# Patient Record
Sex: Female | Born: 1955 | Race: Black or African American | Hispanic: No | Marital: Single | Smoking: Never smoker
Health system: Southern US, Community
[De-identification: ages and names within clinical notes are randomized; demographics above are authoritative.]

## PROBLEM LIST (undated history)

## (undated) DIAGNOSIS — I890 Lymphedema, not elsewhere classified: Secondary | ICD-10-CM

## (undated) DIAGNOSIS — I10 Essential (primary) hypertension: Secondary | ICD-10-CM

## (undated) DIAGNOSIS — D649 Anemia, unspecified: Secondary | ICD-10-CM

## (undated) DIAGNOSIS — R29898 Other symptoms and signs involving the musculoskeletal system: Secondary | ICD-10-CM

## (undated) DIAGNOSIS — E119 Type 2 diabetes mellitus without complications: Secondary | ICD-10-CM

## (undated) DIAGNOSIS — G43909 Migraine, unspecified, not intractable, without status migrainosus: Secondary | ICD-10-CM

## (undated) HISTORY — DX: Anemia, unspecified: D64.9

## (undated) HISTORY — PX: OTHER SURGICAL HISTORY: SHX169

## (undated) HISTORY — DX: Migraine, unspecified, not intractable, without status migrainosus: G43.909

---

## 2007-12-10 ENCOUNTER — Emergency Department: Payer: Self-pay | Admitting: Emergency Medicine

## 2008-03-19 ENCOUNTER — Inpatient Hospital Stay: Payer: Self-pay | Admitting: Internal Medicine

## 2008-08-03 ENCOUNTER — Emergency Department: Payer: Self-pay | Admitting: Emergency Medicine

## 2008-11-09 DIAGNOSIS — M79609 Pain in unspecified limb: Secondary | ICD-10-CM | POA: Insufficient documentation

## 2009-08-22 ENCOUNTER — Emergency Department: Payer: Self-pay | Admitting: Emergency Medicine

## 2009-08-27 DIAGNOSIS — I890 Lymphedema, not elsewhere classified: Secondary | ICD-10-CM | POA: Insufficient documentation

## 2010-10-21 DIAGNOSIS — E1122 Type 2 diabetes mellitus with diabetic chronic kidney disease: Secondary | ICD-10-CM | POA: Insufficient documentation

## 2011-10-27 DIAGNOSIS — E1139 Type 2 diabetes mellitus with other diabetic ophthalmic complication: Secondary | ICD-10-CM | POA: Insufficient documentation

## 2011-10-27 DIAGNOSIS — H3581 Retinal edema: Secondary | ICD-10-CM | POA: Insufficient documentation

## 2012-06-02 DIAGNOSIS — H4060X0 Glaucoma secondary to drugs, unspecified eye, stage unspecified: Secondary | ICD-10-CM | POA: Insufficient documentation

## 2013-01-28 ENCOUNTER — Emergency Department: Payer: Self-pay | Admitting: Emergency Medicine

## 2013-01-28 LAB — SEDIMENTATION RATE: Erythrocyte Sed Rate: >140 mm/hr — ABNORMAL HIGH (ref 0–30)

## 2013-01-28 LAB — BASIC METABOLIC PANEL
Anion Gap: 9 (ref 7–16)
Calcium, Total: 9.5 mg/dL (ref 8.5–10.1)
Chloride: 102 mmol/L (ref 98–107)
Co2: 25 mmol/L (ref 21–32)
Creatinine: 1.27 mg/dL (ref 0.60–1.30)
EGFR (African American): 55 — ABNORMAL LOW
EGFR (Non-African Amer.): 47 — ABNORMAL LOW
Glucose: 202 mg/dL — ABNORMAL HIGH (ref 65–99)
Potassium: 4.2 mmol/L (ref 3.5–5.1)
Sodium: 136 mmol/L (ref 136–145)

## 2013-01-28 LAB — CBC
HCT: 24.7 % — ABNORMAL LOW (ref 35.0–47.0)
HGB: 7.5 g/dL — ABNORMAL LOW (ref 12.0–16.0)
MCH: 23.4 pg — ABNORMAL LOW (ref 26.0–34.0)
RBC: 3.2 10*6/uL — ABNORMAL LOW (ref 3.80–5.20)
WBC: 10.2 10*3/uL (ref 3.6–11.0)

## 2013-03-05 ENCOUNTER — Emergency Department: Payer: Self-pay | Admitting: Emergency Medicine

## 2013-03-05 LAB — BASIC METABOLIC PANEL
ANION GAP: 4 — AB (ref 7–16)
BUN: 18 mg/dL (ref 7–18)
CREATININE: 1.6 mg/dL — AB (ref 0.60–1.30)
Calcium, Total: 9.5 mg/dL (ref 8.5–10.1)
Chloride: 103 mmol/L (ref 98–107)
Co2: 29 mmol/L (ref 21–32)
EGFR (Non-African Amer.): 36 — ABNORMAL LOW
GFR CALC AF AMER: 42 — AB
GLUCOSE: 319 mg/dL — AB (ref 65–99)
Osmolality: 286 (ref 275–301)
POTASSIUM: 3.8 mmol/L (ref 3.5–5.1)
Sodium: 136 mmol/L (ref 136–145)

## 2013-03-05 LAB — CBC WITH DIFFERENTIAL/PLATELET
Basophil #: 0.1 10*3/uL (ref 0.0–0.1)
Basophil %: 1 %
Eosinophil #: 0.2 10*3/uL (ref 0.0–0.7)
Eosinophil %: 1.7 %
HCT: 23.9 % — ABNORMAL LOW (ref 35.0–47.0)
HGB: 7.4 g/dL — AB (ref 12.0–16.0)
LYMPHS PCT: 7.8 %
Lymphocyte #: 0.8 10*3/uL — ABNORMAL LOW (ref 1.0–3.6)
MCH: 23 pg — AB (ref 26.0–34.0)
MCHC: 31 g/dL — AB (ref 32.0–36.0)
MCV: 74 fL — AB (ref 80–100)
Monocyte #: 0.7 x10 3/mm (ref 0.2–0.9)
Monocyte %: 6.3 %
Neutrophil #: 8.8 10*3/uL — ABNORMAL HIGH (ref 1.4–6.5)
Neutrophil %: 83.2 %
Platelet: 330 10*3/uL (ref 150–440)
RBC: 3.22 10*6/uL — ABNORMAL LOW (ref 3.80–5.20)
RDW: 17.4 % — AB (ref 11.5–14.5)
WBC: 10.5 10*3/uL (ref 3.6–11.0)

## 2013-03-05 LAB — SEDIMENTATION RATE: Erythrocyte Sed Rate: >140 mm/hr — ABNORMAL HIGH (ref 0–30)

## 2013-03-05 LAB — URINALYSIS, COMPLETE
BILIRUBIN, UR: NEGATIVE
Bacteria: NONE SEEN
Glucose,UR: 50 mg/dL (ref 0–75)
KETONE: NEGATIVE
Leukocyte Esterase: NEGATIVE
Nitrite: NEGATIVE
PH: 6 (ref 4.5–8.0)
Protein: 100
RBC,UR: 14 /HPF (ref 0–5)
SPECIFIC GRAVITY: 1.01 (ref 1.003–1.030)
Squamous Epithelial: 7
WBC UR: <1 /HPF (ref 0–5)

## 2013-09-29 ENCOUNTER — Ambulatory Visit: Payer: Self-pay | Admitting: Otolaryngology

## 2015-05-04 ENCOUNTER — Emergency Department
Admission: EM | Admit: 2015-05-04 | Discharge: 2015-05-04 | Disposition: A | Discharge status: Home / Self Care | Payer: BLUE CROSS/BLUE SHIELD | Attending: Emergency Medicine | Admitting: Emergency Medicine

## 2015-05-04 ENCOUNTER — Emergency Department: Payer: BLUE CROSS/BLUE SHIELD

## 2015-05-04 DIAGNOSIS — G4489 Other headache syndrome: Secondary | ICD-10-CM | POA: Diagnosis not present

## 2015-05-04 DIAGNOSIS — R51 Headache: Secondary | ICD-10-CM | POA: Diagnosis present

## 2015-05-04 LAB — SEDIMENTATION RATE: SED RATE: 126 mm/h — AB (ref 0–30)

## 2015-05-04 MED ORDER — HYDROCODONE-ACETAMINOPHEN 5-325 MG PO TABS: 1.0000 | ORAL_TABLET | Freq: Four times a day (QID) | ORAL | Status: DC | PRN

## 2015-05-04 MED ORDER — HYDROCODONE-ACETAMINOPHEN 5-325 MG PO TABS
1.0000 | ORAL_TABLET | Freq: Once | ORAL | Status: AC
Start: 2015-05-04 — End: 2015-05-04
  Administered 2015-05-04: 1 via ORAL
  Filled 2015-05-04: qty 1

## 2015-05-04 NOTE — ED Provider Notes (Signed)
Time Seen: Approximately 11:15  I have reviewed the triage notes  Chief Complaint: Headache   History of Present Illness: Veronica Krueger is a 60 y.o. female who states she's noticed a headache now left-sided over the past week. She states the headache is been on and off but relatively constant this morning. He denies any history of similar headaches. She states that she'sa mild nausea with no persistent vomiting. She states she has vision problems and is followed frequently by an ophthalmologist, but denies any new visual disturbances. She denies any head trauma, fever, weakness. She points mainly to the left occipital area and states occasionally the headache will radiate toward the left temporal region. She's not noticed any rashes. She denies any photophobia, midline neck pain, etc.   No past medical history on file.  There are no active problems to display for this patient.   No past surgical history on file.  No past surgical history on file.  No current outpatient prescriptions on file.  Allergies:  Gabapentin and Penicillins  Family History: No family history on file.  Social History: Social History  Substance Use Topics  . Smoking status: Not on file  . Smokeless tobacco: Not on file  . Alcohol Use: Not on file     Review of Systems:   10 point review of systems was performed and was otherwise negative:  Constitutional: No fever Eyes: No visual disturbances ENT: No sore throat, ear pain Cardiac: No chest pain Respiratory: No shortness of breath, wheezing, or stridor Abdomen: No abdominal pain, no vomiting, No diarrhea Endocrine: No weight loss, No night sweats Extremities:Chronic bilateral peripheral edema Skin: No rashes, easy bruising Neurologic: No focal weakness, trouble with speech or swollowing Urologic: No dysuria, Hematuria, or urinary frequency   Physical Exam:  ED Triage Vitals  Enc Vitals Group     BP --      Pulse Rate 05/04/15 1043  70     Resp 05/04/15 1043 18     Temp 05/04/15 1043 97.7 F (36.5 C)     Temp Source 05/04/15 1043 Oral     SpO2 05/04/15 1043 100 %     Weight 05/04/15 1043 436 lb 15.2 oz (198.2 kg)     Height 05/04/15 1043 5\' 9"  (1.753 m)     Head Cir --      Peak Flow --      Pain Score 05/04/15 1045 8     Pain Loc --      Pain Edu? --      Excl. in [place]? --     General: Awake , Alert , and Oriented times 3; GCS 15 Head: Normal cephalic , atraumatic Eyes: Pupils equal , round, reactive to light Nose/Throat: No nasal drainage, patent upper airway without erythema or exudate.  Neck: Supple, Full range of motion, No anterior adenopathy or palpable thyroid masses Lungs: Clear to ascultation without wheezes , rhonchi, or rales Heart: Regular rate, regular rhythm without murmurs , gallops , or rubs Abdomen: Soft, non tender without rebound, guarding , or rigidity; bowel sounds positive and symmetric in all 4 quadrants. No organomegaly .        Extremities: 2 plus symmetric pulses. No edema, clubbing or cyanosis Neurologic: normal ambulation, Motor symmetric without deficits, sensory intact Skin: warm, dry, no rashes   Labs:   All laboratory work was reviewed including any pertinent negatives or positives listed below:  Labs Reviewed  SEDIMENTATION RATE   Radiology:    EXAM:  CT HEAD WITHOUT CONTRAST  TECHNIQUE: Contiguous axial images were obtained from the base of the skull through the vertex without intravenous contrast.  COMPARISON: None.  FINDINGS: Unremarkable appearance of the calvarium without acute fracture or aggressive lesion.  Unremarkable appearance of the scalp soft tissues.  Unremarkable appearance of the bilateral orbits.  Mastoid air cells are clear.  No significant paranasal sinus disease  No acute intracranial hemorrhage, midline shift, or mass effect.  Gray-white differentiation is maintained, without CT evidence of acute ischemia.  Unremarkable  configuration of the ventricles.  IMPRESSION: No CT evidence of acute intracranial abnormality.  Signed,  Dulcy Fanny. Earleen Newport DO  [facility]  [facility]    I personally reviewed the radiologic studies     ED Course: * Differential diagnosis includes but is not exclusive to subarachnoid hemorrhage, meningitis, encephalitis, previous head trauma, cavernous venous thrombosis, muscle tension headache, temporal arteritis, migraine or migraine equivalent, etc.  Patient's stay here was uneventful notice her sedimentation rate is elevated though review of her previous laboratory work shows past elevated sedimentation rates. It is hard to determine whether or not this may be temporal arteritis, however , the patient denies any new visual disturbances. Because of her diabetes I did not want to start her inappropriately on high-dose prednisone at this time. We are reassured with her normal CAT scan that she doesn't have an intracerebral mass which was a concern given her a one-week history of headache. I do not have a strong clinical suspicion for subarachnoid hemorrhage, cavernous venous thrombosis, etc. Patient was advised to contact her ophthalmologist who she sees on a regular basis and also her primary physician for further outpatient management. Given her description and it seems to be either a muscle tension headache the this time.  Assessment:  Acute unspecified cephalgia      Plan:  The patient's otherwise done well here in emergency department on oral Norco and she'll be discharged on the same. She was advised that she can take over-the-counter pain medication Outpatient management with headache instructions Patient was advised to return immediately if condition worsens. Patient was advised to follow up with their primary care physician or other specialized physicians involved in their outpatient care            Daymon Larsen, MD 05/04/15 1425

## 2015-05-04 NOTE — Discharge Instructions (Signed)
General Headache Without Cause A headache is pain or discomfort felt around the head or neck area. The specific cause of a headache may not be found. There are many causes and types of headaches. A few common ones are:  Tension headaches.  Migraine headaches.  Cluster headaches.  Chronic daily headaches. HOME CARE INSTRUCTIONS  Watch your condition for any changes. Take these steps to help with your condition: Managing Pain  Take over-the-counter and prescription medicines only as told by your health care provider.  Lie down in a dark, quiet room when you have a headache.  If directed, apply ice to the head and neck area:  Put ice in a plastic bag.  Place a towel between your skin and the bag.  Leave the ice on for 20 minutes, 2-3 times per day.  Use a heating pad or hot shower to apply heat to the head and neck area as told by your health care provider.  Keep lights dim if bright lights bother you or make your headaches worse. Eating and Drinking  Eat meals on a regular schedule.  Limit alcohol use.  Decrease the amount of caffeine you drink, or stop drinking caffeine. General Instructions  Keep all follow-up visits as told by your health care provider. This is important.  Keep a headache journal to help find out what may trigger your headaches. For example, write down:  What you eat and drink.  How much sleep you get.  Any change to your diet or medicines.  Try massage or other relaxation techniques.  Limit stress.  Sit up straight, and do not tense your muscles.  Do not use tobacco products, including cigarettes, chewing tobacco, or e-cigarettes. If you need help quitting, ask your health care provider.  Exercise regularly as told by your health care provider.  Sleep on a regular schedule. Get 7-9 hours of sleep, or the amount recommended by your health care provider. SEEK MEDICAL CARE IF:   Your symptoms are not helped by medicine.  You have a  headache that is different from the usual headache.  You have nausea or you vomit.  You have a fever. SEEK IMMEDIATE MEDICAL CARE IF:   Your headache becomes severe.  You have repeated vomiting.  You have a stiff neck.  You have a loss of vision.  You have problems with speech.  You have pain in the eye or ear.  You have muscular weakness or loss of muscle control.  You lose your balance or have trouble walking.  You feel faint or pass out.  You have confusion.   This information is not intended to replace advice given to you by your health care provider. Make sure you discuss any questions you have with your health care provider.   Document Released: 02/09/2005 Document Revised: 10/31/2014 Document Reviewed: 06/04/2014 Elsevier Interactive Patient Education [employer].  Please return immediately if condition worsens. Please contact her primary physician or the physician you were given for referral. If you have any specialist physicians involved in her treatment and plan please also contact them. Thank you for using [place] [facility] emergency [facility].

## 2015-05-04 NOTE — ED Notes (Addendum)
Pt reports left side headache off and on for past week. Denies history migraines. Denies any other complaints except nausea

## 2015-10-30 DIAGNOSIS — E119 Type 2 diabetes mellitus without complications: Secondary | ICD-10-CM

## 2015-10-30 DIAGNOSIS — H2512 Age-related nuclear cataract, left eye: Secondary | ICD-10-CM | POA: Insufficient documentation

## 2015-10-30 HISTORY — DX: Type 2 diabetes mellitus without complications: E11.9

## 2016-10-28 DIAGNOSIS — Z961 Presence of intraocular lens: Secondary | ICD-10-CM | POA: Diagnosis not present

## 2016-10-28 DIAGNOSIS — H35372 Puckering of macula, left eye: Secondary | ICD-10-CM | POA: Diagnosis not present

## 2016-10-28 DIAGNOSIS — E113211 Type 2 diabetes mellitus with mild nonproliferative diabetic retinopathy with macular edema, right eye: Secondary | ICD-10-CM | POA: Diagnosis not present

## 2016-10-28 DIAGNOSIS — H47399 Other disorders of optic disc, unspecified eye: Secondary | ICD-10-CM | POA: Diagnosis not present

## 2016-12-23 DIAGNOSIS — E113313 Type 2 diabetes mellitus with moderate nonproliferative diabetic retinopathy with macular edema, bilateral: Secondary | ICD-10-CM | POA: Diagnosis not present

## 2016-12-23 DIAGNOSIS — Z961 Presence of intraocular lens: Secondary | ICD-10-CM | POA: Diagnosis not present

## 2016-12-23 DIAGNOSIS — H35372 Puckering of macula, left eye: Secondary | ICD-10-CM | POA: Diagnosis not present

## 2017-02-12 DIAGNOSIS — I10 Essential (primary) hypertension: Secondary | ICD-10-CM | POA: Diagnosis not present

## 2017-02-12 DIAGNOSIS — E119 Type 2 diabetes mellitus without complications: Secondary | ICD-10-CM | POA: Diagnosis not present

## 2017-02-12 DIAGNOSIS — M79606 Pain in leg, unspecified: Secondary | ICD-10-CM | POA: Diagnosis not present

## 2017-02-12 DIAGNOSIS — R69 Illness, unspecified: Secondary | ICD-10-CM | POA: Diagnosis not present

## 2017-02-12 DIAGNOSIS — E7849 Other hyperlipidemia: Secondary | ICD-10-CM | POA: Diagnosis not present

## 2017-02-12 DIAGNOSIS — R21 Rash and other nonspecific skin eruption: Secondary | ICD-10-CM | POA: Diagnosis not present

## 2017-02-12 DIAGNOSIS — Q820 Hereditary lymphedema: Secondary | ICD-10-CM | POA: Diagnosis not present

## 2017-03-11 DIAGNOSIS — I8312 Varicose veins of left lower extremity with inflammation: Secondary | ICD-10-CM | POA: Diagnosis not present

## 2017-03-11 DIAGNOSIS — I8311 Varicose veins of right lower extremity with inflammation: Secondary | ICD-10-CM | POA: Diagnosis not present

## 2017-03-11 DIAGNOSIS — I890 Lymphedema, not elsewhere classified: Secondary | ICD-10-CM | POA: Diagnosis not present

## 2017-03-23 DIAGNOSIS — E113213 Type 2 diabetes mellitus with mild nonproliferative diabetic retinopathy with macular edema, bilateral: Secondary | ICD-10-CM | POA: Diagnosis not present

## 2017-03-23 DIAGNOSIS — H35372 Puckering of macula, left eye: Secondary | ICD-10-CM | POA: Diagnosis not present

## 2017-03-23 DIAGNOSIS — H2512 Age-related nuclear cataract, left eye: Secondary | ICD-10-CM | POA: Diagnosis not present

## 2017-03-29 ENCOUNTER — Ambulatory Visit: Payer: Medicare HMO | Attending: Internal Medicine | Admitting: Occupational Therapy

## 2017-03-29 ENCOUNTER — Other Ambulatory Visit: Payer: Self-pay

## 2017-03-29 DIAGNOSIS — I890 Lymphedema, not elsewhere classified: Secondary | ICD-10-CM | POA: Insufficient documentation

## 2017-03-29 DIAGNOSIS — R269 Unspecified abnormalities of gait and mobility: Secondary | ICD-10-CM | POA: Insufficient documentation

## 2017-03-30 ENCOUNTER — Encounter: Payer: Self-pay | Admitting: Occupational Therapy

## 2017-03-30 NOTE — Therapy (Signed)
[place] MAIN [facility] SERVICES [address] [place], [place], [address] Phone: [phone]   Fax:  [phone]  Occupational Therapy Evaluation and Discharge Summary Lymphedema   Patient Details  Name: Veronica Krueger MRN: [id number] Date of Birth: 1956-02-13 Referring Provider: Sarina Ser, MD   Encounter Date: 03/29/2017  OT End of Session - 03/30/17 1442    Visit Number  1    Number of Visits  1    OT Start Time  0210    OT Stop Time  0310    OT Time Calculation (min)  60 min    Equipment Utilized During Treatment  Hi Lo table    Activity Tolerance  Patient tolerated treatment well;Other (comment) Pt limited by inability BLE weakness impacting inability to transfer from transport wc to treatment surface. No grab bars available in  outpatient clinic.    Behavior During Therapy  [facility] for tasks assessed/performed       History reviewed. No pertinent past medical history.  History reviewed. No pertinent surgical history.  There were no vitals filed for this visit.  Subjective Assessment - 03/30/17 1414    Subjective   Veronica Krueger is referred to Occupational Therapy for evaluation and treatment of BLE Lymphedema (LE) by Veronica Bathe, MD. Pt reports onset of BLE greater than 5 years ago, but she knowas of no specific precipitating event. She is unsure of a specific onset date, stating, "I jsut looked down and wondered how my legs got to be this way."  Pt reports legs swelling has become more frequent and more severe over the last 2 years.  Pt denies hx of venous ulcers , VDT and  non-healing wounds. She  does not wear day or night  time compression garments and she has not previously undergone Complete Decongestive Therapy (CDT) for LE care.    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; DM; Obesity; BLE muscle weakness, impaired functional  mobilitty;-unable to complete SPT from wc to hi lo table in clinic (unable to  lift feet on and off of transport wc front rigging,; difficulty walking- uses manual wc in the home and requires assistance from others for pushing manual wc in the community 2/2 B shoulder pain; unable to drive own vehickle; unable to reach feet to inspect skin, perform nail and skin care, and bath feet and distal legs;     Limitations  Non-ambulatory; impaired functional mobilitty;-unable to complete SPT from wc to hi lo table in clinic; unable to  lift feet on and off of transport wc front rigging,  reliant on manual wc in the home;  requires assistance from others to propell manual w/c  in the community 2/2 B shoulder pain; unable to drive own vehickle; unable to reach feet to don and doff  shoes, inspect skin, perform nail and skin care and Krueger feet and distal legs;; difficulty fitting LB clothing and preferred stree5t shoes; difficulty performing instrumental ADLs requiring standing and walking , including home management activities, cooking/ meal prep, cleaning, washing dishes, laundry, making beds and yardwork.     Currently in Pain?  Yes    Pain Descriptors / Indicators  Aching;Tightness;Sore;Tiring;Heaviness;Discomfort;Numbness    Pain Type  Chronic pain    Pain Onset  Today >5 yrs s/p    Pain Frequency  Intermittent    Aggravating Factors   standing, walking, dependent positioning    Pain Relieving  Factors  nothing    Effect of Pain on Daily Activities  see LIMITATIONS above. chronic leg swelling and pain limits functional performance in all occipational domains, including basic and instrumental ADLs, productive ac5ivities, leisure pursuits, and social participation and role performance       Mild-Moderate, Stage II, BLE lymphedema, L>R, secondary to unknown etiology   Skin Description Hyper-Keratosis [place] Orange Shiny Tight Fibrotic Fatty Doughy Indurated    x x x Moderately dense palpable fibrosis below  knees to ankles. L>R       Hydration Dry Flaky Erythema Other   mildly x     Color Redness Present Pallor Blanching Hemosiderin Staining Other      Slight skinb darkening at distal legs    Odor Malodorous Yeast Present Absent      x   Temperature Warm Cool Normal     x   Pitting Edema  NON PITTING   1+ 2+ 3+ 4+ >4            Girth Symmetrical Asymmetrical Other Distribution    L>R    Stemmer Sign Positive Negative   Strong + , L>R    Lymphorrea History Of:  Present Absent     Absent , at present, but + hx    Wounds History Of Present Absent Venous Arterial Pressure Size   denies   x          Signs of Infection Redness Warmth Erythema Acute Swelling Drainage absent                x   Scars Adhesions Hypersensitivity        Sensation Light Touch Deep pressure Hypersensitivty   Present Impaired Present Impaired Present Impaired   WNL   WNL- tender  WNL   x  Nails WNL Fungus Present Other   x     Hair Growth Symmetrical Asymmetrical   x    Skin Creases Base pf toes  Base of Fingers       mild            OT Education - 03/30/17 1437    Education provided  Yes    Education Details  Provided Pt/caregiver skilled education regarding lymphatic structure and function, various lymphedema etiologies, obnset patterns and progression,  impact of obesity on lymphatic system function, and  discussed Complete Decongestive Therapy for LE care, including 4 components of Intensive and Self Management Phases.  Discussed   Importance of daily daily LE self care to retain clinical gains and limit progression.  Discussed lymphedema precautions, cellulitis risk,  Provided printed Lymphedema Workbook for reference.    Person(s) Educated  Patient    Methods  Explanation;Demonstration;Handout    Comprehension  Verbalized understanding;Returned demonstration          OT Long Term Goals - 03/29/17 1448      OT LONG TERM GOAL #1   Title  Pt  modified independent w/  lymphedema precautions and prevention principals and strategies using printed Lymphedema Workbook as a reference to limit LE progression, increased  infection risk and further functional decline.     Baseline  Max A    Time  1    Period  Days    Status  Achieved    Target Date  03/29/17            Plan - 03/30/17 1611    Clinical Impression Statement  Veronica Krueger is a 62 year old female presenting with moderate,  stage III, BLE lymphedema secondary to morbid obesity and dependent positioning.  Sudies demonstrate poorer lymphedema treatment outcomes due to extensive stress on the lymphatic structures with excessive adipose tissue. Prior to commencing lymphedema treatment program this Pt will benefit from participation in an effictive weight reduction plan that will have a significant impact on reduction of limb volumes, both fluid and subcutaneous fat reduction.      Occupational performance deficits (Please refer to evaluation for details):  ADL's;IADL's;Work;Leisure;Social Participation;Other body image    Rehab Potential  Poor    Current Impairments/barriers affecting progress:  impaired functional transfers, BLE muscle weakness limits fluid muscle pump efficacy    OT Treatment/Interventions  Self-care/ADL training    Plan  Pt is evaluated and discharged from Occupational Therapy for Complete Decongestive Therapy to BLE to address BLE lymphedema. Pt iPty has a poor prognosis for treatment eficacy without significant weight loss.     OT Home Exercise Plan  Prior to undergoing lymphedema treatment , Pt will benefit from medical management for weight loss. Pt will benefit from a consult with Dominica Severin, MD, at [facility] , who specializes in examining new therapies for obesity. Phone [phone], [email or web address]    Consulted and Agree with Plan of Care  Patient       Patient will benefit from skilled therapeutic intervention in order to improve the following deficits and  impairments:  Decreased skin integrity, Decreased knowledge of precautions, Impaired perceived functional ability, Decreased activity tolerance, Decreased knowledge of use of DME, Decreased strength, Impaired flexibility, Decreased balance, Decreased mobility, Difficulty walking, Impaired sensation, Obesity, Decreased range of motion, Increased edema, Pain  Visit Diagnosis: Lymphedema, not elsewhere classified - Plan: Ot plan of care cert/re-cert    Problem List There are no active problems to display for this patient.   Ansel Bong 03/30/2017, 4:45 PM  [place] MAIN [facility] SERVICES [address] [place], [place], [address] Phone: [phone]   Fax:  [phone]  Name: Veronica Krueger MRN: [id number] Date of Birth: 1955-10-25

## 2017-04-06 ENCOUNTER — Ambulatory Visit: Payer: Medicare HMO

## 2017-04-06 ENCOUNTER — Other Ambulatory Visit: Payer: Self-pay

## 2017-04-06 DIAGNOSIS — R269 Unspecified abnormalities of gait and mobility: Secondary | ICD-10-CM

## 2017-04-06 DIAGNOSIS — I890 Lymphedema, not elsewhere classified: Secondary | ICD-10-CM | POA: Diagnosis not present

## 2017-04-06 NOTE — Therapy (Addendum)
[place] MAIN [facility] SERVICES [address] [place], [place], [address] Phone: [phone]   Fax:  [phone]  Physical Therapy Evaluation  Patient Details  Name: Veronica Krueger MRN: [id number] Date of Birth: 10-03-1955 Referring Provider: Dr. Kendall Flack   Encounter Date: 04/06/2017    History reviewed. No pertinent past medical history.  History reviewed. No pertinent surgical history.  There were no vitals filed for this visit.         PATIENT INFORMATION: This Evaluation form will serve as the LMN for the following suppliers:  Supplier:[employer] Contact Person:Erik Marlin Canary, SMS Phone: [phone]   Reason for Referral: Patient/caregiver Goals: Patient was seen for face-to-face evaluation for new manual wheelchair.  Also present was    Serafina Royals to discuss recommendations and wheelchair options.  Further paperwork was completed and sent to vendor.  Patient appears to qualify for manual mobility device at this time per objective findings.   MEDICAL HISTORY: Diagnosis:E66.01 Morbid obesity due to excess calories, additional diagnosis of lymphedema  Primary Diagnosis Onset: 2010 '[x]' Progressive Disease Relevant Past and Future Surgeries: multiple past skin grafts/eye surgeries ( 2015),  Height: 5 ft 9 inch Weight: 467.8 lb  Explain and recent changes or trends in weight: Increase in lymphedema within the past year  Relevant History including falls: Has been dependent on manual chair since 2013 and has been diagnosed with lymphedema since January 2019. Partially paralyzed left arm. Reports no falls in the past six months. Transfers independently at home from bed to chair and in chair through bathroom and house. Chronic leg swelling and pain limit functional performance in basic and instrumental ADLs, productive activities, leisure pursuits and social participation.       HOME ENVIRONMENT: '[x]' House  '[]' Condo/town home   '[]' Apartment  '[]' Assisted Living    '[x]' Lives Alone '[]'  Lives with Others                                                    Hours with caregiver:   '[x]' Home is accessible to patient            Stairs  '[]' Yes '[]'  No     Ramp '[x]' Yes '[]' No Comments:  Bathroom accessible, wheelchair ramp.    COMMUNITY ADL: TRANSPORTATION: '[]' Car    '[]' Van    '[]' Public Transportation    '[]' Adapted w/c Lift   '[]' Ambulance   '[x]' Other:  Cannot drive, a friend does the shopping for her and gives her rides.      '[]' Sits in wheelchair during transport  Employment/School:     Specific requirements pertaining to mobility                                                     Other:                                  Primarily stays within the house.      FUNCTIONAL/SENSORY PROCESSING SKILLS:  Handedness:   '[x]' Right     '[]' Left    '[]' NA  Comments:  Functional Processing Skills for Wheeled Mobility '[x]' Processing Skills are adequate for safe wheelchair operation  Areas of concern than may interfere with safe operation of wheelchair Description of problem   '[]'  Attention to environment     '[]' Judgment     '[]'  Hearing  '[]'  Vision or visual processing    '[]' Motor Planning  '[]'  Fluctuations in Behavior                                                   VERBAL COMMUNICATION: '[x]' WFL receptive '[]'  WFL expressive '[]' Understandable  '[]' Difficult to understand  '[]' non-communicative '[]'  Uses an augmented communication device    CURRENT SEATING / MOBILITY: Current Mobility Base:   '[]' None  '[]' Dependent  '[x]' Manual  '[]' Scooter  '[]' Power   Type of Control:                       Manufacturer:   [employer] [id number]                      Size:                         Age:     2011                      Current Condition of Mobility Base:                 Needs repair                                                                                                    Current Wheelchair components:       Leg rests but doesn't  use it because needs to use feet to move chair                                                                                                                            Describe posture in present seating system:     Did not bring chair to evaluation.  SENSATION and SKIN ISSUES: Sensation '[x]' Intact '[]' Impaired '[]' Absent   Level of sensation:                         Pressure Relief: Able to perform effective pressure relief :   '[x]' Yes  '[]'  No Method:    Through transfers and UE/LE movements                                                                          If not, Why?:                                                                          Skin Issues/Skin Integrity Current Skin Issues   '[x]' Yes '[]' No  '[]' Intact '[]'  Red area '[]'  Open Area  '[]' Scar Tissue '[]' At risk from prolonged sitting  Where        Lymphedema                      History of Skin Issues   '[]' Yes '[x]' No  Where                                         When                                               Hx of skin flap surgeries '[]' Yes '[x]' No  Where                  When                                                  Limited sitting tolerance '[]' Yes '[x]' No Hours spent sitting in wheelchair daily:   All day: morning to night: 12 or more hours                                                      Complaint of Pain:  Please describe:            Total body pain, arms, legs, shoulder, 4/10 pain  Swelling/Edema:  Lymphedema   Yes L calf 28.25inch R 28 inch                                                                                                                                              ADL STATUS (in reference to wheelchair use):  Indep Assist Unable Indep with Equip Not assessed Comments  Dressing                                        x                                  Eating                                  x                                                                                            Toileting                                           x                                                                                    Bathing                                     x  Grooming/ Hygiene                                        x                                                                                      Meal Prep                                        x                                                                                  IADLS                                         x                                                                         Bowel Management: '[x]' Continent  '[]' Incontinent  '[]' Accidents Comments:                                                  Bladder Management: '[x]' Continent  '[]' Incontinent  '[]' Accidents Comments:                                              WHEELCHAIR SKILLS: Manual w/c Propulsion: '[x]' UE or LE strength and endurance sufficient to participate in ADLs using manual wheelchair Arm :  '[]' left '[]' right  '[]' Both                                   Foot:   '[]' left '[]' right  '[]' Both  Distance:   Operate Scooter: '[]'  Strength, hand grip, balance and transfer appropriate for use '[]' Living environment is accessible for use of scooter  Operate Power w/c:  '[]'  Std. Joystick   '[]'  Alternative Controls Indep '[]'  Assist '[]'  Dependent/ Unable '[]'  N/A '[]'  '[]' Safe          '[]'  Functional  Distance:                Bed confined without wheelchair '[x]'  Yes '[]'  No   STRENGTH/RANGE OF MOTION:  Range of Motion Strength  Shoulder        R 90 Flexion,  99 abduction: L : limited within 5 degrees of motion                                                                                                   R 3+/5, L    Elbow                       WFL                                                                                       4+/5  Wrist/Hand                              WFL                                                                                                       4+/5    Hip                                                            Limited by body habitus: Functional: Flexion to 90 bilaterally, Extension to neutral bilaterally in standing                                                               Able to maintain standing position with UE support, 3/5  Knee         Limited by body habitus: [facility]  L 3/5 R 2+/5  Ankle Limited by body habitus                                                                 L 3/5 R 2/5      MOBILITY/BALANCE:  '[]'  Patient is totally dependent for mobility                                                                                               Balance Transfers Ambulation  Sitting Balance: Standing Balance: '[]'  Independent '[]'  Independent/Modified Independent  '[]'  WFL     '[]'  WFL '[x]'  Supervision '[]'  Supervision  '[x]'  Uses UE for balance  '[]'  Supervision '[]'  Min Assist '[]'  Ambulates with Assist                           '[]'  Min Assist '[x]'  Min assist '[]'  Mod Assist '[]'  Ambulates with Device:  '[]'  RW   '[]'  StW   '[]'  Cane   '[]'                 '[]'  Mod Assist '[]'  Mod assist '[]'  Max assist   '[]'  Max Assist '[]'  Max assist '[]'  Dependent '[]'  Indep. Short Distance Only  '[]'  Unable '[]'  Unable '[]'  Lift / Sling Required Distance (in feet)                          Unable to ambulate   '[]'  Sliding board '[x]'  Unable to Ambulate: (Explain: Can perform standing transfers but unable to demonstrate ambulatory capacity.  Cardio Status:  '[x]' Intact  '[]'  Impaired   '[]'  NA                              Respiratory Status:  '[x]' Intact   '[]' Impaired   '[]' NA                                      Orthotics/Prosthetics:  Used to have R brace for R leg but it broke                                                                       Comments (Address manual vs power w/c vs scooter):      Patient is independent in home mobility with current manual wheelchair. She is independent with transfers at home from chair to bed and vice versa with house currently adapted to chair. Patient prefers utilizing manual chair  to power by utilizing RUE and bilateral LE to propel chair around house. Was not accepting of idea of power chair at this time due to desire to remain as independent as possible.  Patient demonstrated ability to transfer from chair with UE support independently as well as demonstrated appropriate strength of LEs and RUE for wheelchair propulsion. Patient requires anti-tippers to allow for safe transfers due to body habitus and momentum of weight and heel loops to allow for proper body mechanics and positioning to decrease risk of pressure sores or contractures.                                     Anterior / Posterior Obliquity Rotation-Pelvis  PELVIS    '[x]' Neutral  '[]'  Posterior  '[]'  Anterior     '[]' WFL  '[]' Right Elevated  '[]' Left Elevated   '[]' WFL  '[]' Right Anterior '[]'   Left Anterior    '[]'  Fixed '[x]'  Partly Flexible '[]'  Flexible  '[]'  Other  '[]'  Fixed  '[]'  Partly Flexible  '[]'  Flexible '[]'  Other  '[]'  Fixed  '[]'  Partly Flexible  '[]'  Flexible '[]'  Other  TRUNK '[x]' WFL '[]' Thoracic Kyphosis '[]' Lumbar Lordosis   '[x]'  WFL '[]' Convex Right '[]' Convex Left   '[]' c-curve '[]' s-curve '[]' multiple  '[]'  Neutral '[]'  Left-anterior '[]'  Right-anterior    '[]'  Fixed '[]'  Flexible '[]'  Partly Flexible       Other  '[]'  Fixed '[]'  Flexible '[]'  Partly Flexible '[]'  Other  '[]'  Fixed           '[]'  Flexible '[]'  Partly Flexible '[]'  Other   Position Windswept   HIPS  '[]'  Neutral '[x]'  Abduct '[]'  ADduct '[]'  Neutral '[]'  Right '[]'  Left       '[]'  Fixed  '[x]'  Partly Flexible             '[]'  Dislocated '[]'  Flexible '[]'   Subluxed    '[]'  Fixed '[]'  Partly Flexible  '[]'  Flexible '[]'  Other              Foot Positioning Knee Positioning   Knees and  Feet  '[x]'  WFL '[]' Left '[]' Right '[x]'  WFL '[]' Left '[]' Right   KNEES ROM concerns: limited by body habitus ROM concerns: limited by body habitus   & Dorsi-Flexed                    '[]' Lt '[]' Rt                                  FEET Plantar Flexed                  '[]' Lt '[]' Rt     Inversion                    '[]' Lt '[]' Rt     Eversion                    '[]' Lt '[]' Rt    HEAD '[x]'  Functional '[x]'  Good Head Control   & '[]'  Flexed         '[]'  Extended '[]'  Adequate Head Control   NECK '[]'  Rotated  Lt  '[]'  Lat Flexed Lt '[]'  Rotated  Rt '[]'  Lat Flexed Rt '[]'  Limited Head Control    '[]'  Cervical Hyperextension '[]'  Absent  Head Control    SHOULDERS ELBOWS WRIST& HAND  Left     Right    Left     Right  U/E '[]' Functional  Left            '[x]' Functional  Right        [facility]                     WFL    '[]' Fisting             '[]' Fisting     '[]' elevated Left '[]' depressed  Left '[]' elevated Right '[]' depressed  Right      '[]' protracted Left '[]' retracted Left '[]' protracted Right '[]' retracted Right '[]' subluxed  Left              '[]' subluxed  Right         Goals for Wheelchair Mobility  '[x]'  Independence with mobility in the home with motor related ADLs (MRADLs)  '[x]'  Independence with MRADLs in the community '[x]'  Provide dependent mobility  '[]'  Provide recline     '[]' Provide tilt   Goals for Seating system '[x]'  Optimize pressure distribution '[]'  Provide support needed to facilitate function or safety '[]'  Provide corrective forces to assist with maintaining or improving posture '[]'  Accommodate client's posture: current seated postures and positions are not flexible or will not tolerate corrective forces '[x]'  Client to be independent with relieving pressure in the wheelchair '[]' Enhance physiological function such as breathing, swallowing, digestion  Simulation ideas/Equipment trials:                [employer]                                                                                  State why other equipment was unsuccessful:     Unwilling to use power chair, scooter not functional in home environment due to set up.                                                                             MOBILITY BASE RECOMMENDATIONS and JUSTIFICATION: MOBILITY COMPONENT JUSTIFICATION  Manufacturer:           Model:              Size: Width           Seat Depth             '[x]' provide transport from point A to B '[]' promote Indep mobility  '[x]' is not a safe, functional ambulator '[x]' walker or cane inadequate '[]' non-standard width/depth necessary to accommodate anatomical measurement '[]'                             '[x]' Manual Mobility Base '[x]' non-functional ambulator    '[]' Scooter/POV  '[]' can safely operate  '[]' can safely transfer   '[]' has adequate trunk stability  '[]' cannot functionally propel manual w/c  '[]' Power Mobility Base  '[]' non-ambulatory  '[]' cannot functionally propel manual wheelchair  '[]'   cannot functionally and safely operate scooter/POV '[]' can safely operate and willing to  '[]' Stroller Base '[]' infant/child  '[]' unable to propel manual wheelchair '[]' allows for growth '[]' non-functional ambulator '[]' non-functional UE '[]' Indep mobility is not a goal at this time  '[]' Tilt  '[]' Forward                   '[]' Backward                  '[]' Powered tilt              '[]' Manual tilt  '[]' change position against gravitational force on head and shoulders  '[]' change position for pressure relief/cannot weight shift '[]' transfers  '[]' management of tone '[]' rest periods '[]' control edema '[]' facilitate postural control  '[]'                                       '[]' Recline  '[]' Power recline on power base '[]' Manual recline on manual base  '[]' accommodate femur to back angle  '[]' bring to full recline for ADL care  '[]' change position for pressure relief/cannot weight shift '[]' rest periods '[]' repositioning for transfers or clothing/diaper /catheter  changes '[]' head positioning  '[]' Lighter weight required '[]' self- propulsion  '[]' lifting '[]'                                                 '[x]' Heavy Duty required '[x]' user weight greater than 250# '[]' extreme tone/ over active movement '[]' broken frame on previous chair '[]'                                     '[]'  Back  '[]'  Angle Adjustable '[]'  Custom molded                           '[]' postural control '[]' control of tone/spasticity '[]' accommodation of range of motion '[]' UE functional control '[]' accommodation for seating system '[]'                                          '[]' provide lateral trunk support '[]' accommodate deformity '[]' provide posterior trunk support '[]' provide lumbar/sacral support '[]' support trunk in midline '[]' Pressure relief over spinal processes  '[x]'  Seat Cushion                       '[]' impaired sensation  '[]' decubitus ulcers present '[]' history of pressure ulceration '[]' prevent pelvic extension '[x]' low maintenance  '[]' stabilize pelvis  '[]' accommodate obliquity '[]' accommodate multiple deformity '[]' neutralize lower extremity position '[x]' increase pressure distribution '[]'                                           '[]'  Pelvic/thigh support  '[]'  Lateral thigh guide '[]'  Distal medial pad  '[]'  Distal lateral pad '[]'  pelvis in neutral '[]' accommodate pelvis '[]'  position upper legs '[]'  alignment '[]'  accommodate ROM '[]'  decrease adduction '[]' accommodate tone '[]' removable for transfers '[]' decrease abduction  '[]'  Lateral trunk Supports '[]'  Lt     '[]'  Rt '[]' decrease lateral trunk leaning '[]' control tone '[]' contour for increased contact '[]' safety  '[]'   accommodate asymmetry '[]'                                                 '[]'  Mounting hardware  '[]' lateral trunk supports  '[]' back   '[]' seat '[]' headrest      '[]'  thigh support '[]' fixed   '[]' swing away '[]' attach seat platform/cushion to w/c frame '[]' attach back cushion to w/c frame '[]' mount postural supports '[]' mount headrest  '[]' swing medial thigh support away '[]' swing lateral  supports away for transfers  '[]'                                                     Armrests  '[]' fixed '[x]' adjustable height '[]' removable   '[]' swing away  '[]' flip back   '[]' reclining '[]' full length pads '[]' desk    '[]' pads tubular  '[x]' provide support with elbow at 90   '[]' provide support for w/c tray '[]' change of height/angles for variable activities '[]' remove for transfers '[x]' allow to come closer to table top '[]' remove for access to tables '[]'                                               Hangers/ Leg rests  '[x]' 60 '[]' 70 '[]' 90 '[]' elevating '[x]' heavy duty  '[]' articulating '[]' fixed '[]' lift off '[x]' swing away     '[]' power '[x]' provide LE support  '[]' accommodate to hamstring tightness '[]' elevate legs during recline   '[]' provide change in position for Legs '[x]' Maintain placement of feet on footplate '[]' durability '[]' enable transfers '[]' decrease edema '[]' Accommodate lower leg length '[]'                                         Foot support Footplate    '[x]' Lt  '[x]'  Rt  '[]'  Center mount '[x]' flip up                            '[]' depth/angle adjustable '[]' Amputee adapter    '[]'  Lt     '[]'  Rt '[x]' provide foot support '[]' accommodate to ankle ROM '[x]' transfers '[]' Provide support for residual extremity '[x]'  allow foot to go under wheelchair base '[]'  decrease tone  '[]'                                                 '[]'  Ankle strap/heel loops '[]' support foot on foot support '[]' decrease extraneous movement '[]' provide input to heel  '[]' protect foot  Tires: '[x]' pneumatic  '[x]' flat free inserts  '[]' solid  '[x]' decrease maintenance  '[]' prevent frequent flats '[]' increase shock absorbency '[]' decrease pain from road shock '[]' decrease spasms from road shock '[]'                                              '[]'  Headrest  '[]' provide posterior head support '[]' provide posterior neck support '[]' provide lateral head  support '[]' provide anterior head support '[]' support during tilt and recline '[]' improve feeding   '[]' improve respiration '[]' placement of  switches '[]' safety  '[]' accommodate ROM  '[]' accommodate tone '[]' improve visual orientation  '[]'  Anterior chest strap '[]'  Vest '[]'  Shoulder retractors  '[]' decrease forward movement of shoulder '[]' accommodation of TLSO '[]' decrease forward movement of trunk '[]' decrease shoulder elevation '[]' added abdominal support '[]' alignment '[]' assistance with shoulder control  '[]'                                               Pelvic Positioner '[x]' Belt '[]' SubASIS bar '[]' Dual Pull '[]' stabilize tone '[x]' decrease falling out of chair/ **will not Decrease potential for sliding due to pelvic tilting '[]' prevent excessive rotation '[]' pad for protection over boney prominence '[]' prominence comfort '[]' special pull angle to control rotation '[]'                                                  Upper ExtremitySupport  '[]' L   '[]'  R '[]' Arm trough   '[]' hand support '[]'  tray       '[]' full tray '[]' swivel mount '[]' decrease edema      '[]' decrease subluxation   '[]' control tone   '[]' placement for AAC/Computer/EADL '[]' decrease gravitational pull on shoulders '[]' provide midline positioning '[]' provide support to increase UE function '[]' provide hand support in natural position '[]' provide work surface   POWER WHEELCHAIR CONTROLS  '[]' Proportional  '[]' Non-Proportional Type                                      '[]' Left  '[]' Right '[]' provides access for controlling wheelchair   '[]' lacks motor control to operate proportional drive control '[]' unable to understand proportional controls  Actuator Control Module  '[]' Single  '[]' Multiple   '[]' Allow the client to operate the power seat function(s) through the joystick control   '[]' Safety Reset Switches '[]' Used to change modes and stop the wheelchair when driving in latch mode    '[]' Therapist, art   '[]' programming for accurate control '[]' progressive Disease/changing condition '[]' non-proportional drive control needed '[]' Needed in order to operate power seat functions through joystick control   '[]' Display box '[]' Allows user to  see in which mode and drive the wheelchair is set  '[]' necessary for alternate controls    '[]' Digital interface electronics '[]' Allows w/c to operate when using alternative drive controls  '[]' ASL Head Array '[]' Allows client to operate wheelchair  through switches placed in tri-panel headrest  '[]' Sip and puff with tubing kit '[]' needed to operate sip and puff drive controls  '[]' Upgraded tracking electronics '[]' increase safety when driving '[]' correct tracking when on uneven surfaces  '[]' Mount for switches or joystick '[]' Attaches switches to w/c  '[]' Swing away for access or transfers '[]' midline for optimal placement '[]' provides for consistent access  '[]' Attendant controlled joystick plus mount '[]' safety '[]' long distance driving '[]' operation of seat functions '[]' compliance with transportation regulations '[]'                                             Rear wheel placement/Axle adjustability '[]' None '[]' semi adjustable '[x]' fully adjustable  '[x]' improved UE access to wheels '[x]' improved stability '[x]' changing angle in space  for improvement of postural stability '[]' 1-arm drive access '[]' amputee pad placement '[]'                                Wheel rims/ hand rims  '[x]' metal   '[]' plastic coated '[]' oblique projections           '[]' vertical projections '[x]' Provide ability to propel manual wheelchair  '[]'  Increase self-propulsion with hand weakness/decreased grasp  Push handles '[]' extended   '[]' angle adjustable              '[x]' standard '[x]' caregiver access '[x]' caregiver assist '[]' allows "hooking" to enable increased ability to perform ADLs or maintain balance  One armed device   '[]' Lt   '[]' Rt '[]' enable propulsion of manual wheelchair with one arm   '[]'                                            Brake/wheel lock extension '[]'  Lt   '[]'  Rt '[]' increase indep in applying wheel locks   '[]' Side guards '[]' prevent clothing getting caught in wheel or becoming soiled '[]'  prevent skin tears/abrasions  Battery:                                            '[]' to  power wheelchair                                                         Other:                                                                                                                        The above equipment has a life- long use expectancy. Growth and changes in medical and/or functional conditions would be the exceptions. This is to certify that the therapist has no financial relationship with durable medical provider or manufacturer. The therapist will not receive remuneration of any kind for the equipment recommended in this evaluation.   Patient has mobility limitation that significantly impairs safe, timely participation in one or more mobility related ADL's. (bathing, toileting, feeding, dressing, grooming, moving from room to room)  '[x]'  Yes '[]'  No  Will mobility device sufficiently improve ability to participate and/or be aided in participation of MRADL's?      '[x]'  Yes '[]'  No  Can limitation be compensated for with use of a cane or walker?                                    '[]'   Yes '[x]'  No  Does patient or caregiver demonstrate ability/potential ability & willingness to safely use the mobility device?    '[x]'  Yes '[]'  No  Does patient's home environment support use of recommended mobility device?            '[x]'  Yes '[]'  No  Does patient have sufficient upper extremity function necessary to functionally propel a manual wheelchair?     '[x]'  Yes '[]'  No  Does patient have sufficient strength and trunk stability to safely operate a POV (scooter)?                                  '[]'  Yes '[x]'  No  Does patient need additional features/benefits provided by a power wheelchair for MRADL's in the home?        '[]'  Yes '[x]'  No  Does the patient demonstrate the ability to safely use a power wheelchair?                   '[x]'  Yes '[]'  No     Physician's Name Printed:                                                        Physician's Signature:  Date:     This is to certify that I, the above signed  therapist have the following affiliations: '[x]'  This DME provider '[]'  Manufacturer of recommended equipment '[]'  Patient's long term care facility '[]'  None of the above  Therapist Name/Signature:          Janna Arch, PT, DPT                                    Date:04/06/17           Objective measurements completed on examination: See above findings.                   PT Long Term Goals - 04/06/17 1711      PT LONG TERM GOAL #1   Title  Pt and caregivers will understand PT recommendation and appropriate/safe use for wheelchair and seating for home use    Baseline  understand, unwilling to obtain power chair, prefers manual    Time  1    Period  Days    Status  Achieved               Patient will benefit from skilled therapeutic intervention in order to improve the following deficits and impairments:     Visit Diagnosis: Abnormality of gait and mobility - Plan: PT plan of care cert/re-cert     Problem List There are no active problems to display for this patient.  Janna Arch, PT, DPT   Janna Arch 05/24/2017, 8:40 AM  [facility] [address] [place], [place], [address] Phone: [phone]   Fax:  [phone]  Name: Veronica Krueger MRN: [id number] Date of Birth: [birth year]

## 2017-04-14 DIAGNOSIS — R69 Illness, unspecified: Secondary | ICD-10-CM | POA: Diagnosis not present

## 2017-06-01 DIAGNOSIS — R69 Illness, unspecified: Secondary | ICD-10-CM | POA: Diagnosis not present

## 2017-06-23 DIAGNOSIS — H35372 Puckering of macula, left eye: Secondary | ICD-10-CM | POA: Diagnosis not present

## 2017-06-23 DIAGNOSIS — R69 Illness, unspecified: Secondary | ICD-10-CM | POA: Diagnosis not present

## 2017-06-23 DIAGNOSIS — E113313 Type 2 diabetes mellitus with moderate nonproliferative diabetic retinopathy with macular edema, bilateral: Secondary | ICD-10-CM | POA: Diagnosis not present

## 2017-06-23 DIAGNOSIS — H43822 Vitreomacular adhesion, left eye: Secondary | ICD-10-CM | POA: Diagnosis not present

## 2017-07-14 DIAGNOSIS — I10 Essential (primary) hypertension: Secondary | ICD-10-CM | POA: Diagnosis not present

## 2017-07-14 DIAGNOSIS — E119 Type 2 diabetes mellitus without complications: Secondary | ICD-10-CM | POA: Diagnosis not present

## 2017-07-20 ENCOUNTER — Other Ambulatory Visit: Payer: Self-pay | Admitting: Surgery

## 2017-07-22 DIAGNOSIS — R69 Illness, unspecified: Secondary | ICD-10-CM | POA: Diagnosis not present

## 2017-08-02 ENCOUNTER — Encounter: Payer: Medicare HMO | Attending: Surgery | Admitting: Dietician

## 2017-08-02 ENCOUNTER — Encounter: Payer: Self-pay | Admitting: Dietician

## 2017-08-02 VITALS — Ht 69.0 in | Wt >= 6400 oz

## 2017-08-02 DIAGNOSIS — Z6841 Body Mass Index (BMI) 40.0 and over, adult: Secondary | ICD-10-CM | POA: Insufficient documentation

## 2017-08-02 DIAGNOSIS — Z713 Dietary counseling and surveillance: Secondary | ICD-10-CM | POA: Diagnosis not present

## 2017-08-02 NOTE — Patient Instructions (Signed)
   Keep portions of foods small; limit starchy foods in particular, keep to the size of a fist (1 cup) or less.   Try eating 1 1/2 sandwiches instead of 2, or try thinner sliced bread, or avoid chips.   Work on eating meals slowly to help with controlling hunger. Eat enough protein and low-carb veggies.   Keep up the chair exercises, great job!

## 2017-08-02 NOTE — Progress Notes (Signed)
Nutrition Assessment  Date: 08/02/17  Proposed Surgery: sleeve gastrectomy  RE: Veronica Krueger  DOB: 1955-06-10  MRN: [id number] MD: Rockne Coons RD: Erlene Quan  Height: 5'9" Weight: 444.6lbs (499.8 - 55.2lbs for wheelchair) BMI: 65.66  Upper IBW% (UIBW): 279% (IBW 160lbs)  Patient's Goal Weight: not stated  Medical History: Diabetes, HTN, lymphedema Medications and Supplements: reconciled list in medical record  Previous surgeries: 2010 surgery due to infection, patient cannot recall details due to severe illness. Was hospitalized from January until May 2010.  Drug allergies: PCN, gabapentin (hair loss) Food allergies: none; does not like milk or seafood Alcohol use: none  Tobacco use: none, never  Physical activity: wheelchair exercises daily 1hr am and 1hr pm. Patient is unable to walk more than a few steps at a time due to lymphedema. She is unable to lift her legs more than a few inches. 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  Weight history: Childhood: overweight    Adolescence: overweight    Adulthood: obese, but increased more rapidly in the past 9 years since surgery and illness    Weight 1 year ago: close to current weight per patient  Dieting/ weight loss history: no specific diets; periodically over the years has worked on [employer] and, exercise with videos such as Charlean Sanfilippo and others. She lost some weight and inches when exercising regularly.  Dietary Recall: buys groceries once a month Daily pattern: 2 meals and 2-3 snacks. Dining out: 0-1 meals per week. Patient has been working on decreasing food portions, consuming less sugar and salt, and has switched to whole wheat bread. She lives alone and is unable to stand for long periods to cook meals, or use her left hand for many tasks. She buys groceries once a month.   Breakfast: none (up late at night) Lunch: 2 sandwiches bologna and cheese, [place], or ham; loves fruits-- bananas, grapes, canned peaches or  pears no juice, sometimes eats chips with sandwiches.  Supper: ground [place] burger or hot dog; takeout food about 2x a month fried chicken Snacks: nuts-- walnuts, pecans, honey buns, grahams, vegetable crackers Beverages: mostly water, sometimes with sugar free flavoring, tea with sugar substitute of sometimes sugar when sweetener runs out; coffee with sucralose, rarely diet diet soda   Psychosocial: Emotional eating history: denies any emotional or binge eating  Disordered eating history: none  Other: none  Intervention:  Patient has researched this procedure by online seminar, read packet materials.   Instructed her on pre-op diet guidelines, including liver reduction diet.   Discussed stages of the bariatric diet after surgery as well as the importance of adequate protein and fluid intake.   Summary:  Patient has made diet and lifestyle changes in effort to lose weight and prepare for bariatric surgery.  She has solid support from friends who are already assisting her with her needs.   She agrees to work on further improving food and snack choices prior to surgery.   She is motivated to follow the bariatric diet after surgery. From a nutrition standpoint, she is ready to proceed with the bariatric surgery program.    Plan:  Patient commits to returning for 6 supervised weight loss visits, as well as pre-op class prior to surgery.   She will plan to return for post-op RD visits beginning 2-3 weeks after surgery.

## 2017-08-04 DIAGNOSIS — H4063X3 Glaucoma secondary to drugs, bilateral, severe stage: Secondary | ICD-10-CM | POA: Diagnosis not present

## 2017-08-04 DIAGNOSIS — T380X5A Adverse effect of glucocorticoids and synthetic analogues, initial encounter: Secondary | ICD-10-CM | POA: Diagnosis not present

## 2017-08-04 DIAGNOSIS — H35353 Cystoid macular degeneration, bilateral: Secondary | ICD-10-CM | POA: Diagnosis not present

## 2017-08-25 ENCOUNTER — Encounter: Payer: Self-pay | Admitting: Dietician

## 2017-08-25 ENCOUNTER — Encounter: Payer: Medicare HMO | Attending: Surgery | Admitting: Dietician

## 2017-08-25 VITALS — Ht 69.0 in | Wt >= 6400 oz

## 2017-08-25 DIAGNOSIS — Z713 Dietary counseling and surveillance: Secondary | ICD-10-CM | POA: Insufficient documentation

## 2017-08-25 DIAGNOSIS — Z6841 Body Mass Index (BMI) 40.0 and over, adult: Secondary | ICD-10-CM | POA: Diagnosis not present

## 2017-08-25 NOTE — Patient Instructions (Signed)
   Include a protein food with each meal. If not meat, then lowfat cheese, eggs, small portion of nuts or peanut butter, or beans such as pintos, black beans, navy beans, northern beans, etc.   Keep working to control portions of starchy foods.   Record what you eat, and estimate the number of carb servings with each meal and snack. Keep a record for at least 3 days, or longer. Bring this to your next appointment.

## 2017-08-25 NOTE — Progress Notes (Signed)
Appt start time: 1430 end time:  1515.  Assessment:   #1 SWL Appointment.   Start Wt at [facility]: 444.6lbslbs Wt: 336.5lbslbs Ht: 5'9" BMI: 65.94  Preferred Learning Style:   Auditory  Visual  Hands on   Learning Readiness:   Change in progress  MEDICATIONS: acetaminophen, glipizide, lisinopril-hydrochlorothiazide, metoprolol tartrate, ophthalmic solutions: dorzolamide, latanoprost, timolol  DIETARY INTAKE: Patient reports further reducing food portions such as chips with sandwiches. She has not eaten snacks in recent days, as her monthly supply has been depleted. She will be buying groceries today for this month.   24-hr recall:  Breakfast: usually none, but does like breakfast food  Snack: none  Lunch: 2 sandwiches without chips Snack: nuts, fruit, graham crackers. No snacks when snack food runs out.  Dinner: hamburger helper, cauliflower with dressing, cabbage, carrots. Most meals are without meat; has had a vegetable omelet for supper. Snack: dry roasted nuts unsalted; walnuts; no honey buns recently; some graham crackers; applesauce Beverages: water, sometimes sugar free flavoring  Usual physical activity: 60 minutes daily wheelchair exercises   Diet to Follow: Further decrease carbohydrate intake, aided by daily monitoring  Protein source with each meal               Nutritional Diagnosis:  [place]-3.3 Overweight/obesity related to history of excess calories and physical inactivity as evidenced by patient preparing for bariatric surgery, now following dietary guidelines for weight loss.              Intervention:  Nutrition weight loss counseling for upcoming Bariatric Surgery.   Patient states she needs to make changes gradually.    Weight likely fluctuating easily due to lymphedema.    Set goals to keep a food diary and monitor carb intake.   Teaching Method Utilized:  Visual Auditory Hands on  Handouts given during visit include:  Quick and Easy Meal  Ideas  Goals and Instructions  Barriers to learning/adherence to lifestyle change: none  Demonstrated degree of understanding via:  Teach Back   Monitoring/Evaluation:  Dietary intake, exercise, and body weight 09/23/17.

## 2017-09-01 ENCOUNTER — Ambulatory Visit
Admission: RE | Admit: 2017-09-01 | Discharge: 2017-09-01 | Disposition: A | Discharge status: Home / Self Care | Payer: Medicare HMO | Source: Ambulatory Visit | Attending: Surgery | Admitting: Surgery

## 2017-09-01 DIAGNOSIS — K219 Gastro-esophageal reflux disease without esophagitis: Secondary | ICD-10-CM | POA: Insufficient documentation

## 2017-09-09 DIAGNOSIS — E119 Type 2 diabetes mellitus without complications: Secondary | ICD-10-CM | POA: Diagnosis not present

## 2017-09-09 DIAGNOSIS — Q820 Hereditary lymphedema: Secondary | ICD-10-CM | POA: Diagnosis not present

## 2017-09-09 DIAGNOSIS — E7849 Other hyperlipidemia: Secondary | ICD-10-CM | POA: Diagnosis not present

## 2017-09-09 DIAGNOSIS — R51 Headache: Secondary | ICD-10-CM | POA: Diagnosis not present

## 2017-09-09 DIAGNOSIS — E034 Atrophy of thyroid (acquired): Secondary | ICD-10-CM | POA: Diagnosis not present

## 2017-09-09 DIAGNOSIS — I10 Essential (primary) hypertension: Secondary | ICD-10-CM | POA: Diagnosis not present

## 2017-09-18 DIAGNOSIS — R69 Illness, unspecified: Secondary | ICD-10-CM | POA: Diagnosis not present

## 2017-09-19 DIAGNOSIS — R69 Illness, unspecified: Secondary | ICD-10-CM | POA: Diagnosis not present

## 2017-09-23 ENCOUNTER — Ambulatory Visit: Payer: Self-pay | Admitting: Dietician

## 2017-09-27 DIAGNOSIS — E11630 Type 2 diabetes mellitus with periodontal disease: Secondary | ICD-10-CM | POA: Diagnosis not present

## 2017-09-27 DIAGNOSIS — Z7722 Contact with and (suspected) exposure to environmental tobacco smoke (acute) (chronic): Secondary | ICD-10-CM | POA: Diagnosis not present

## 2017-09-27 DIAGNOSIS — Z809 Family history of malignant neoplasm, unspecified: Secondary | ICD-10-CM | POA: Diagnosis not present

## 2017-09-27 DIAGNOSIS — K08109 Complete loss of teeth, unspecified cause, unspecified class: Secondary | ICD-10-CM | POA: Diagnosis not present

## 2017-09-27 DIAGNOSIS — G8929 Other chronic pain: Secondary | ICD-10-CM | POA: Diagnosis not present

## 2017-09-27 DIAGNOSIS — H409 Unspecified glaucoma: Secondary | ICD-10-CM | POA: Diagnosis not present

## 2017-09-27 DIAGNOSIS — I10 Essential (primary) hypertension: Secondary | ICD-10-CM | POA: Diagnosis not present

## 2017-09-27 DIAGNOSIS — Z7984 Long term (current) use of oral hypoglycemic drugs: Secondary | ICD-10-CM | POA: Diagnosis not present

## 2017-09-27 DIAGNOSIS — I890 Lymphedema, not elsewhere classified: Secondary | ICD-10-CM | POA: Diagnosis not present

## 2017-09-29 DIAGNOSIS — H35372 Puckering of macula, left eye: Secondary | ICD-10-CM | POA: Diagnosis not present

## 2017-09-29 DIAGNOSIS — H43822 Vitreomacular adhesion, left eye: Secondary | ICD-10-CM | POA: Diagnosis not present

## 2017-09-29 DIAGNOSIS — E113313 Type 2 diabetes mellitus with moderate nonproliferative diabetic retinopathy with macular edema, bilateral: Secondary | ICD-10-CM | POA: Diagnosis not present

## 2017-10-11 ENCOUNTER — Encounter: Payer: Self-pay | Admitting: Dietician

## 2017-10-11 ENCOUNTER — Encounter: Payer: Medicare HMO | Attending: Surgery | Admitting: Dietician

## 2017-10-11 VITALS — Ht 69.0 in | Wt >= 6400 oz

## 2017-10-11 DIAGNOSIS — Z713 Dietary counseling and surveillance: Secondary | ICD-10-CM | POA: Insufficient documentation

## 2017-10-11 DIAGNOSIS — Z6841 Body Mass Index (BMI) 40.0 and over, adult: Secondary | ICD-10-CM | POA: Insufficient documentation

## 2017-10-11 NOTE — Patient Instructions (Addendum)
   For meatless protein options, try [employer], or [employer].   Eat more vegetables -- have pictures taken of frozen veggies, for example, so you can see the availability at the grocery store.   Drink plenty of water, sugar free flavored water, or sugar free tea. Aim for 64oz of fluid daily.

## 2017-10-11 NOTE — Progress Notes (Signed)
Appt start time: 1330 end time:  1400.  Assessment:   #2 SWL Appointment.   Start Wt at NDES: 444.6lbs 08/25/17 446.5lbs Wt: 439.5lbs Ht: 5'9" BMI: 64.9 Weight loss of 7.0lbs since previous visit on 08/25/17  Preferred Learning Style:   Auditory  Visual  Hands on    Learning Readiness:   Change in progress  MEDICATIONS: acetaminophen; dorzolamide ophthalmic solution; glipiZIDE; latanoprost ophthalmic solution; lisinopril-hydrochlorothiazide, metoprolol, timolol ophthalmic solution  Progress: Patient reports tough month with severe headache which typically lasts for multiple days. Nausea accompanied the headache, so food intake was more erratic. Patient has worked to include protein more frequently and control food portions.   DIETARY INTAKE: 24-hr recall:  Breakfast: none, wakes up late  Snack: none  Lunch: 2 sandwiches with fruit Snack: nuts, fruit, graham crackers Dinner: chicken, pork, eggs and cheese, hamburger patty Snack: mostly fruit, sometimes graham crackers or nuts Beverages: water  Usual physical activity: wheelchair exercises 60 minutes daily  Diet to Follow: Protein source with each meal Low-carb vegetables daily              Nutritional Diagnosis:  [place]-3.3 Overweight/obesity related to history of excess calories and physical inactivity as evidenced by patient with BMI of 64.9 and patient report of dietary history, making diet changes in preparation for bariatric surgery.               Intervention:  Nutrition counseling for upcoming Bariatric Surgery.   Reviewed progress since previous visit; commended patient for changes made.    Discussed protein sources including vegetarian options and protein supplements.    Discussed strategies for increasing vegetables and variety of vegetables/ foods.   Teaching Method Utilized:  Visual Auditory   Handouts given during visit include:  Goals and instructions   Barriers to learning/adherence to lifestyle  change: none  Demonstrated degree of understanding via:  Teach Back   Monitoring/Evaluation:  Dietary intake, exercise, and body weight 11/04/17.

## 2017-10-20 DIAGNOSIS — Q820 Hereditary lymphedema: Secondary | ICD-10-CM | POA: Diagnosis not present

## 2017-10-20 DIAGNOSIS — I10 Essential (primary) hypertension: Secondary | ICD-10-CM | POA: Diagnosis not present

## 2017-10-20 DIAGNOSIS — E119 Type 2 diabetes mellitus without complications: Secondary | ICD-10-CM | POA: Diagnosis not present

## 2017-11-04 ENCOUNTER — Encounter: Payer: Self-pay | Admitting: Dietician

## 2017-11-04 ENCOUNTER — Ambulatory Visit: Payer: Self-pay | Admitting: Dietician

## 2017-11-04 NOTE — Progress Notes (Signed)
Patient cancelled her appointment today due to lack of transportation. She rescheduled for 11/10/17.

## 2017-11-05 DIAGNOSIS — R69 Illness, unspecified: Secondary | ICD-10-CM | POA: Diagnosis not present

## 2017-11-10 ENCOUNTER — Encounter: Payer: Medicare HMO | Attending: Surgery | Admitting: Dietician

## 2017-11-10 ENCOUNTER — Encounter: Payer: Self-pay | Admitting: Dietician

## 2017-11-10 VITALS — Ht 69.0 in | Wt >= 6400 oz

## 2017-11-10 DIAGNOSIS — Z6841 Body Mass Index (BMI) 40.0 and over, adult: Secondary | ICD-10-CM | POA: Diagnosis not present

## 2017-11-10 DIAGNOSIS — Z713 Dietary counseling and surveillance: Secondary | ICD-10-CM | POA: Diagnosis not present

## 2017-11-10 NOTE — Patient Instructions (Addendum)
   Try fixing enough at supper to eat leftovers for lunch the next day.   A healthy frozen meal such as Healthy Choice or [employer] or Smart Ones can make a good choice for lunch. We looked at chicken with broccoli and cauliflower, Crustless chicken pot pie (Healthy Choice steamers); we looked at [place] chicken, Chicken alfredo with broccoli.  Try having one sandwich at lunch rather than 2, and add some low-carb veggies, such as grape tomatoes, cucumber, or carrot or celery sticks.   Check food labels for Total Carbohydrate, and keep Carbohydrate to 45grams or less for each meal.

## 2017-11-10 NOTE — Progress Notes (Signed)
Appt start time: 1500 end time:  1530.  Assessment:   #3 SWL Appointment.   Start Wt at NDES: 444.6lbs  BMI 65.66 Wt: 441.2lbs Ht: 5'9" BMI: 65.15  Preferred Learning Style:   Auditory  Visual  Hands on    Learning Readiness:   Change in progress  MEDICATIONS: acetaminophen, dorzolamide ophthalmic solution, glipiZIDE, latanoprost ophthalmic solution, lisinopril-hydrochlorothiazide, metoprolol, timolol ophthalmic solution  Progress: Patient has bought vegetarian burger patties and frozen greens, plans to try these foods in the next few days. She reports ongoing effort to increase protein and low-carb vegetables; has avoided all sweets and sugar-sweetened drinks. She reports BGs improving. Weight has increased by under 2lbs which could be due to patient-reported increased swelling in hands and feet; weight is also measured in wheelchair with subtracting wheelchair weight, shift in weight distribution on scale platform could slightly affect measurement.   DIETARY INTAKE: 24-hr recall:  Breakfast: none, sleeping  Snack: none  Lunch: 2 sandwiches on whole grain bread, sometimes with fruit Snack: sometimes fruit Dinner: Reports more protein from meats, also eggs; has made chili with ground [place] and pinto and black beans.  Snack: fruit or graham crackers or nuts Beverages: water, plans to try with small amount of lemon juice  Usual physical activity: wheelchair exercises 60 minutes, 2 times daily while watching TV  Diet to Follow: 30-45g carbohydrates each meal or less               Nutritional Diagnosis:  [place]-3.3 Overweight/obesity related to history of excess calories and physical inactivity as evidenced by patient with BMI of 65 and confined to wheelchair, following dietary guidelines for continued weight loss.              Intervention:  Nutrition counseling for weight loss prior to Bariatric Surgery.    Updated goals with input from patient; advised gradually decreasing  carb intake.     Discussed easy, lower-carb meal options available at patient's grocery store (unable to stand much to cook meals, friend does grocery shopping for patient so patient is less aware of available options)  Teaching Method Utilized:  Visual Auditory   Handouts given during visit include:  Goals and instructions  Barriers to learning/adherence to lifestyle change: none  Demonstrated degree of understanding via:  Teach Back   Monitoring/Evaluation:  Dietary intake, exercise, and body weight 12/02/17.

## 2017-12-02 ENCOUNTER — Encounter: Payer: Medicare HMO | Attending: Surgery | Admitting: Dietician

## 2017-12-02 ENCOUNTER — Encounter: Payer: Self-pay | Admitting: Dietician

## 2017-12-02 VITALS — Ht 69.0 in | Wt >= 6400 oz

## 2017-12-02 DIAGNOSIS — Z713 Dietary counseling and surveillance: Secondary | ICD-10-CM | POA: Diagnosis not present

## 2017-12-02 DIAGNOSIS — Z6841 Body Mass Index (BMI) 40.0 and over, adult: Secondary | ICD-10-CM | POA: Insufficient documentation

## 2017-12-02 NOTE — Progress Notes (Signed)
Appt start time: 1500 end time:  1540.  Assessment:   4th SWL Appointment.   Start Wt at NDES: 444.6lbs Wt: 432.8lbs Ht: 5'9" BMI: 63.9 Weigh loss of 8.4lbs since previous visit on 11/10/17  Preferred Learning Style:   Auditory  Visual  Hands on   Learning Readiness:   Change in progress  MEDICATIONS: acetaminophen, dorzolamide ophthalmic solution, glipiZIDE, latanoprost ophthalmic solution, lisinopril-hydrochlorothiazide, metoprolol, timolol ophthalmic solution  Progress: Patient reports reducing bread intake, 1-3 servings per week for the past 3 weeks; has increased low-carb vegetables. She is avoiding caffeine and sugar-sweetened beverages.  Marland Kitchen  DIETARY INTAKE:  24-hr recall:  Breakfast: none, sleeping  Snack: none  Lunch: chicken and vegetables, or boiled eggs and veg Snack: fruit Dinner: lean meat and vegetables; had potatoes once, small portion mac and cheese once Snack: fruit or graham crackers with peanut butter or nuts; occasionally grain and fruit bar Beverages: water, occasional caffeine free diet soda  Usual physical activity: wheelchair exercise, 60 minutes, 2 times daily  Diet to Follow: 30 g carbohydrates 60+g protein               Nutritional Diagnosis:  [place]-3.3 Overweight/obesity related to history of excess calories and physical inactivity as evidenced by patient with current BMI of 63.9, following dietary guidelines for continued weight loss.              Intervention:  Nutrition counseling for weight loss prior to bariatric surgery.    Encouraged patient to include a protein-containing snack between dinner and bedtime, as she is up late and is sleeping during breakfast hours.     Patient plans to investigate her options for protein supplements after surgery.   Teaching Method Utilized:  Visual Auditory   Handouts given during visit include:  Perfect Protein pg 3 (AND)  Goals and instructions  Barriers to learning/adherence to lifestyle  change: none  Demonstrated degree of understanding via:  Teach Back   Monitoring/Evaluation:  Dietary intake, exercise, and body weight 01/03/18.

## 2017-12-02 NOTE — Patient Instructions (Signed)
   Keep up healthy food choices, and limited carbs, great job!

## 2017-12-14 ENCOUNTER — Ambulatory Visit: Payer: Medicare HMO | Admitting: Psychology

## 2017-12-14 DIAGNOSIS — F509 Eating disorder, unspecified: Secondary | ICD-10-CM | POA: Diagnosis not present

## 2017-12-14 DIAGNOSIS — R69 Illness, unspecified: Secondary | ICD-10-CM | POA: Diagnosis not present

## 2017-12-24 DIAGNOSIS — R69 Illness, unspecified: Secondary | ICD-10-CM | POA: Diagnosis not present

## 2017-12-28 DIAGNOSIS — H35372 Puckering of macula, left eye: Secondary | ICD-10-CM | POA: Diagnosis not present

## 2017-12-28 DIAGNOSIS — E113313 Type 2 diabetes mellitus with moderate nonproliferative diabetic retinopathy with macular edema, bilateral: Secondary | ICD-10-CM | POA: Diagnosis not present

## 2017-12-28 DIAGNOSIS — Z961 Presence of intraocular lens: Secondary | ICD-10-CM | POA: Diagnosis not present

## 2017-12-28 DIAGNOSIS — H43822 Vitreomacular adhesion, left eye: Secondary | ICD-10-CM | POA: Diagnosis not present

## 2017-12-30 DIAGNOSIS — R69 Illness, unspecified: Secondary | ICD-10-CM | POA: Diagnosis not present

## 2018-01-03 ENCOUNTER — Encounter: Payer: Medicare HMO | Attending: Surgery | Admitting: Dietician

## 2018-01-03 ENCOUNTER — Encounter: Payer: Self-pay | Admitting: Dietician

## 2018-01-03 VITALS — Ht 69.0 in | Wt >= 6400 oz

## 2018-01-03 DIAGNOSIS — Z713 Dietary counseling and surveillance: Secondary | ICD-10-CM | POA: Insufficient documentation

## 2018-01-03 DIAGNOSIS — Z6841 Body Mass Index (BMI) 40.0 and over, adult: Secondary | ICD-10-CM | POA: Diagnosis not present

## 2018-01-03 NOTE — Progress Notes (Signed)
Appt start time: 1515 end time:  1630   Assessment:   #5 SWL Appointment.   Start Wt at NDES: 444.6lbs Wt: 430.5lbs Ht: 5'9" BMI: 63.57  Preferred Learning Style:   Auditory  Visual  Hands on  Learning Readiness:   Change in progress  MEDICATIONS: acetaminophen, dorzolamide ophthalmic solution, glipiZIDE, latanoprost ophthalmic solution, lisinopril-hydrochlorothiazide, metoprolol, timolol ophthalmic solution  Progress: Patient continues to avoid bread and sweets. She reports some increased intake of protein foods in the past month, and continues to include low-carb vegetables.   DIETARY INTAKE:  Breakfast: none due to sleep  Snack: none  Lunch: leftovers ie spaghetti, greens with pork chop, smoked [place] neck, or ground [place] burger Snack: applesauce, peaches, or pears, grapes, apple, orange Dinner: chicken with vegetable blend; had sub with steak veg on whole wheat bread; sliced raw veg with Mrs dash seasoning; has had some rice, potato 1-2 times Snack: graham crackers sometimes with peanut butter, or fruit Beverages: water, sugar free lemonade cold or hot  Usual physical activity: wheelchair exercises 60 minutes 2x a day  Diet to Follow: 15 g carbohydrates with each meal  Pre-Operative Nutrition Class  Patient was seen on 01/03/18 for Pre-Operative Bariatric Surgery Education at [facility] .   Surgery date: 02/08/18 Surgery type: Sleeve gastrectomy Start weight at NDES: 444.6lbs Weight today: 430.5lbs  InBody  BODY COMP RESULTS -- patient unable to stand on scale; wheelchair bound  Samples given per MNT protocol. Patient educated on appropriate usage: [employer] Protein Powder Lot # [id number], exp: 11/2018; [id number], exp: 08/2018; [id number], exp: 11/2018   The following the learning objectives were met by the patient during this course:  Identify Pre-Op Dietary Goals and will begin 2 weeks pre-operatively  Identify appropriate sources of  fluids and proteins   State protein recommendations and appropriate sources pre and post-operatively  Identify Post-Operative Dietary Goals and will follow for 2 weeks post-operatively  Identify appropriate multivitamin and calcium sources   Handouts given during class include:  Pre-Op Bariatric Surgery Diet Handout  Protein Shake Handout  Post-Op Bariatric Surgery Nutrition Handout  BEL[facility] Program Information Flyer  Support Group Information Flyer  [facility] Bariatric Supplements Price List                Nutritional Diagnosis:  [place]-3.3 Overweight/obesity related to history of excess calories and physical inactivity as evidenced by patient with current BMI of 63.57, following dietary guidelines for weight loss prior to bariatric surgery.              Intervention:  Nutrition counseling for upcoming Bariatric Surgery.    Commended patient for maintaining healthy food choices despite her challenges -- mobility, transportation.    Patient will continue with her current eating pattern and food choices for now.    She will be completing pre-op class instruction individually due to transportation limits; she is unable to attend a morning class, and unable to go to a class in [place].   Teaching Method Utilized:  Visual Auditory Hands on   Barriers to learning/adherence to lifestyle change: none  Demonstrated degree of understanding via:  Teach Back   Follow-Up Plan: Patient will follow-up at [place], for her final supervised weight loss visit, and to complete pre-op class instruction, on 01/24/18.

## 2018-01-03 NOTE — Patient Instructions (Signed)
Continue with current eating pattern and regular exercise 

## 2018-01-18 ENCOUNTER — Ambulatory Visit (INDEPENDENT_AMBULATORY_CARE_PROVIDER_SITE_OTHER): Payer: Medicare HMO | Admitting: Psychology

## 2018-01-18 DIAGNOSIS — F509 Eating disorder, unspecified: Secondary | ICD-10-CM

## 2018-01-18 DIAGNOSIS — R69 Illness, unspecified: Secondary | ICD-10-CM | POA: Diagnosis not present

## 2018-01-24 ENCOUNTER — Ambulatory Visit: Payer: Self-pay | Admitting: Dietician

## 2018-01-27 ENCOUNTER — Ambulatory Visit: Payer: Self-pay | Admitting: Surgery

## 2018-01-27 DIAGNOSIS — I10 Essential (primary) hypertension: Secondary | ICD-10-CM
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
## 2018-02-02 ENCOUNTER — Encounter: Payer: Self-pay | Admitting: Dietician

## 2018-02-02 ENCOUNTER — Encounter: Payer: Medicare HMO | Attending: Surgery | Admitting: Dietician

## 2018-02-02 VITALS — Ht 69.0 in | Wt >= 6400 oz

## 2018-02-02 DIAGNOSIS — Z713 Dietary counseling and surveillance: Secondary | ICD-10-CM | POA: Insufficient documentation

## 2018-02-02 DIAGNOSIS — Z6841 Body Mass Index (BMI) 40.0 and over, adult: Secondary | ICD-10-CM | POA: Diagnosis not present

## 2018-02-02 NOTE — Progress Notes (Signed)
Appt start time: 1600 end time:  1615.  Assessment:   #6 SWL Appointment.   Start Wt at [facility]: 444.6lbs Wt: 431.1lbs Ht: 5'9" BMI: 63.66  Preferred Learning Style:   Auditory  Visual  Hands on  Learning Readiness:   Change in progress  MEDICATIONS: acetaminophen, dorzolamide ophthalmic solution, glipiZIDE, latanoprost ophthalmic solution, lisinopril-hydrochlorothiazide, metoprolol, timolol ophthalmic solution   DIETARY INTAKE:  24-hr recall:  Breakfast: protein shake, [employer] brand  Snack: none  Lunch: protein--chicken, [place] and green leafy vegetables +/or others; chili with beans Snack: sugar free jello Dinner: same as lunch Snack: sugar free jello or graham crackers Beverages: water, sugar free flavored water, protein drink  Usual physical activity: wheelchair exercises 60 minutes, 2x daily  Diet to Follow: Pre-op diet               Nutritional Diagnosis:  [place]-3.3 Overweight/obesity related to history of excess calories and physical inactivity as evidenced by patient current BMI of 63.66, following dietary guidelines for continued weight loss prior to bariatric surgery.              Intervention:    Nutrition counseling for upcoming bariatric surgery.  Reviewed pre- and post-op dietary goals, as well as vitamin and mineral supplementation.   Teaching Method Utilized:  Visual Auditory Hands on  Handouts given during visit include:  Goals and Instructions   Barriers to learning/adherence to lifestyle change: none  Demonstrated degree of understanding via:  Teach Back   Monitoring/Evaluation:  Dietary intake, exercise, and body weight 12/22/17 for 2-week post-op visit.   Pre-Operative Nutrition Class:  Appt start time: 1615   End time:  1700  Patient was seen on 02/02/18  for Pre-Operative Bariatric Surgery Education at [facility].  Surgery date: 02/08/18 Surgery type: Sleeve Gastrectomy Start weight at [facility]:  446.5lbs Weight today: 431.1lbs  Unable to complete body composition analysis as patient is unable to stand on the scale.    The following the learning objectives were met by the patient during this course:  Identify Pre-Op Dietary Goals and will begin 2 weeks pre-operatively  Identify appropriate sources of fluids and proteins   State protein recommendations and appropriate sources pre and post-operatively  Identify Post-Operative Dietary Goals and will follow for 2 weeks post-operatively  Identify appropriate multivitamin and calcium sources  Describe the need for physical activity post-operatively and will follow MD recommendations  State when to call healthcare provider regarding medication questions or post-operative complications  Handouts given during class include:  Pre-Op Bariatric Surgery Diet Handout  Protein Shake Handout  Post-Op Bariatric Surgery Nutrition Handout  [facility] Program Information Flyer  Support Group Information Flyer  [facility] Bariatric Supplements Price List  Follow-Up Plan: Patient will follow-up at [facility], at about 2 weeks post operatively for diet advancement per MD.

## 2018-02-02 NOTE — Patient Instructions (Signed)
   Continue to follow the pre-op diet, great job!  Call with any questions before or after surgery.

## 2018-02-07 ENCOUNTER — Other Ambulatory Visit: Payer: Self-pay

## 2018-02-07 ENCOUNTER — Encounter (HOSPITAL_COMMUNITY): Payer: Self-pay

## 2018-02-07 ENCOUNTER — Encounter (HOSPITAL_COMMUNITY)
Admission: RE | Admit: 2018-02-07 | Discharge: 2018-02-07 | Disposition: A | Discharge status: Home / Self Care | Payer: Medicare HMO | Source: Ambulatory Visit | Attending: Surgery | Admitting: Surgery

## 2018-02-07 DIAGNOSIS — K449 Diaphragmatic hernia without obstruction or gangrene: Secondary | ICD-10-CM | POA: Diagnosis not present

## 2018-02-07 DIAGNOSIS — I440 Atrioventricular block, first degree: Secondary | ICD-10-CM

## 2018-02-07 DIAGNOSIS — I890 Lymphedema, not elsewhere classified: Secondary | ICD-10-CM | POA: Diagnosis not present

## 2018-02-07 DIAGNOSIS — Z23 Encounter for immunization: Secondary | ICD-10-CM | POA: Diagnosis not present

## 2018-02-07 DIAGNOSIS — Z6841 Body Mass Index (BMI) 40.0 and over, adult: Secondary | ICD-10-CM

## 2018-02-07 DIAGNOSIS — I10 Essential (primary) hypertension: Secondary | ICD-10-CM | POA: Diagnosis not present

## 2018-02-07 DIAGNOSIS — E119 Type 2 diabetes mellitus without complications: Secondary | ICD-10-CM | POA: Insufficient documentation

## 2018-02-07 DIAGNOSIS — Z01818 Encounter for other preprocedural examination: Secondary | ICD-10-CM

## 2018-02-07 DIAGNOSIS — D649 Anemia, unspecified: Secondary | ICD-10-CM | POA: Diagnosis not present

## 2018-02-07 DIAGNOSIS — Z7984 Long term (current) use of oral hypoglycemic drugs: Secondary | ICD-10-CM | POA: Diagnosis not present

## 2018-02-07 DIAGNOSIS — Z79899 Other long term (current) drug therapy: Secondary | ICD-10-CM | POA: Diagnosis not present

## 2018-02-07 HISTORY — DX: Essential (primary) hypertension: I10

## 2018-02-07 HISTORY — DX: Other symptoms and signs involving the musculoskeletal system: R29.898

## 2018-02-07 HISTORY — DX: Lymphedema, not elsewhere classified: I89.0

## 2018-02-07 HISTORY — DX: Type 2 diabetes mellitus without complications: E11.9

## 2018-02-07 LAB — COMPREHENSIVE METABOLIC PANEL
ALT: 12 U/L (ref 0–44)
AST: 9 U/L — ABNORMAL LOW (ref 15–41)
Albumin: 3.6 g/dL (ref 3.5–5.0)
Alkaline Phosphatase: 67 U/L (ref 38–126)
Anion gap: 5 (ref 5–15)
BUN: 41 mg/dL — ABNORMAL HIGH (ref 6–20)
CO2: 22 mmol/L (ref 22–32)
Calcium: 9.1 mg/dL (ref 8.9–10.3)
Chloride: 113 mmol/L — ABNORMAL HIGH (ref 98–111)
Creatinine, Ser: 1.42 mg/dL — ABNORMAL HIGH (ref 0.44–1.00)
GFR calc Af Amer: 46 mL/min — ABNORMAL LOW (ref >60)
GFR calc non Af Amer: 40 mL/min — ABNORMAL LOW (ref >60)
Glucose, Bld: 104 mg/dL — ABNORMAL HIGH (ref 70–99)
Potassium: 5.3 mmol/L — ABNORMAL HIGH (ref 3.5–5.1)
Sodium: 140 mmol/L (ref 135–145)
TOTAL PROTEIN: 8.4 g/dL — AB (ref 6.5–8.1)
Total Bilirubin: 0.6 mg/dL (ref 0.3–1.2)

## 2018-02-07 LAB — CBC WITH DIFFERENTIAL/PLATELET
Abs Immature Granulocytes: 0.03 10*3/uL (ref 0.00–0.07)
Basophils Absolute: 0 10*3/uL (ref 0.0–0.1)
Basophils Relative: 0 %
EOS ABS: 0.2 10*3/uL (ref 0.0–0.5)
Eosinophils Relative: 2 %
HCT: 28.4 % — ABNORMAL LOW (ref 36.0–46.0)
Hemoglobin: 7.7 g/dL — ABNORMAL LOW (ref 12.0–15.0)
Immature Granulocytes: 0 %
Lymphocytes Relative: 11 %
Lymphs Abs: 1.1 10*3/uL (ref 0.7–4.0)
MCH: 22.4 pg — ABNORMAL LOW (ref 26.0–34.0)
MCHC: 27.1 g/dL — ABNORMAL LOW (ref 30.0–36.0)
MCV: 82.6 fL (ref 80.0–100.0)
MONOS PCT: 6 %
Monocytes Absolute: 0.6 10*3/uL (ref 0.1–1.0)
Neutro Abs: 8.1 10*3/uL — ABNORMAL HIGH (ref 1.7–7.7)
Neutrophils Relative %: 81 %
Platelets: 212 10*3/uL (ref 150–400)
RBC: 3.44 MIL/uL — ABNORMAL LOW (ref 3.87–5.11)
RDW: 16.9 % — AB (ref 11.5–15.5)
WBC: 10 10*3/uL (ref 4.0–10.5)
nRBC: 0 % (ref 0.0–0.2)

## 2018-02-07 LAB — HEMOGLOBIN A1C
Hgb A1c MFr Bld: 6.3 % — ABNORMAL HIGH (ref 4.8–5.6)
Mean Plasma Glucose: 134.11 mg/dL

## 2018-02-07 LAB — GLUCOSE, CAPILLARY: Glucose-Capillary: 88 mg/dL (ref 70–99)

## 2018-02-07 MED ORDER — CHLORHEXIDINE GLUCONATE CLOTH 2 % EX PADS
6.0000 | MEDICATED_PAD | Freq: Once | CUTANEOUS | Status: DC
  Filled 2018-02-07: qty 6

## 2018-02-07 MED ORDER — BUPIVACAINE LIPOSOME 1.3 % IJ SUSP
20.0000 mL | Freq: Once | INTRAMUSCULAR | Status: DC
  Filled 2018-02-07: qty 20

## 2018-02-07 NOTE — Progress Notes (Signed)
Veronica Krueger, Triage at [facility] called back and stated Dr Hassell Done aware of hgb, potassium , bun and creatinine results from 02/07/2018 and will proceed with surgery.

## 2018-02-07 NOTE — Progress Notes (Signed)
Routed via epic the CBC ( hgb 7.7)  and CMP( Potassium 5.3 ) with elevated BUN and Creatinine  done 02/07/2018  To Dr Johnathan Hausen.   Called office of [facility] and spoke with Abigail Butts in Triage.  Abigail Butts to page MD and inform of CBC and CMP results and call nurse back.

## 2018-02-07 NOTE — Patient Instructions (Addendum)
Veronica Krueger  02/07/2018   Your procedure is scheduled on: 02/08/2018   Report to [facility] Main  Entrance  Report to admitting at  1025    AM    Call this number if you have problems the morning of surgery [phone]   Remember: Do not eat food NO SOLID FOOD AFTER MIDNIGHT THE NIGHT PRIOR TO SURGERY. NOTHING BY MOUTH EXCEPT CLEAR LIQUIDS UNTIL 3 HOURS PRIOR TO Veronica Krueger SURGERY. PLEASE FINISH ENSURE DRINK PER SURGEON ORDER 3 HOURS PRIOR TO SCHEDULED SURGERY TIME WHICH NEEDS TO BE COMPLETED AT __0925am __________. :After Midnight. BRUSH YOUR TEETH MORNING OF SURGERY AND RINSE YOUR MOUTH OUT, NO CHEWING GUM CANDY OR MINTS.     CLEAR LIQUID DIET   Foods Allowed                                                                     Foods Excluded  Coffee and tea, regular and decaf                             liquids that you cannot  Plain Jell-O in any flavor                                             see through such as: Fruit ices (not with fruit pulp)                                     milk, soups, orange juice  Iced Popsicles                                    All solid food Carbonated beverages, regular and diet                                    Cranberry, grape and apple juices Sports drinks like Gatorade Lightly seasoned clear broth or consume(fat free) Sugar, honey syrup  Sample Menu Breakfast                                Lunch                                     Supper Cranberry juice                    Beef broth                            Chicken broth Jell-O  Grape juice                           Apple juice Coffee or tea                        Jell-O                                      Popsicle                                                Coffee or tea                        Coffee or tea  _____________________________________________________________________     Take these medicines the morning  of surgery with A SIP OF WATER: Eye drops as usual , Metoprolol  DO NOT TAKE ANY DIABETIC MEDICATIONS DAY OF YOUR SURGERY                               You may not have any metal on your body including hair pins and              piercings  Do not wear jewelry, make-up, lotions, powders or perfumes, deodorant             Do not wear nail polish.  Do not shave  48 hours prior to surgery.             Do not bring valuables to the hospital. [place].  Contacts, dentures or bridgework may not be worn into surgery.      Patients discharged the day of surgery will not be allowed to drive home.  Name and phone number of your driver:  Special Instructions: coughing and deep breathing exercises,leg exercises               Please read over the following fact sheets you were given: _____________________________________________________________________             [facility] - Preparing for Surgery Before surgery, you can play an important role.  Because skin is not sterile, your skin needs to be as free of germs as possible.  You can reduce the number of germs on your skin by washing with CHG (chlorahexidine gluconate) soap before surgery.  CHG is an antiseptic cleaner which kills germs and bonds with the skin to continue killing germs even after washing. Please DO NOT use if you have an allergy to CHG or antibacterial soaps.  If your skin becomes reddened/irritated stop using the CHG and inform your nurse when you arrive at Short Stay. Do not shave (including legs and underarms) for at least 48 hours prior to the first CHG shower.  You may shave your face/neck. Please follow these instructions carefully:  1.  Shower with CHG Soap the night before surgery and the  morning of Surgery.  2.  If you choose to wash your hair, wash your hair first as usual with your  normal  shampoo.  3.  After you shampoo, rinse your hair and body thoroughly to remove  the  shampoo.                           4.  Use CHG as you would any other liquid soap.  You can apply chg directly  to the skin and wash                       Gently with a scrungie or clean washcloth.  5.  Apply the CHG Soap to your body ONLY FROM THE NECK DOWN.   Do not use on face/ open                           Wound or open sores. Avoid contact with eyes, ears mouth and genitals (private parts).                       Wash face,  Genitals (private parts) with your normal soap.             6.  Wash thoroughly, paying special attention to the area where your surgery  will be performed.  7.  Thoroughly rinse your body with warm water from the neck down.  8.  DO NOT shower/wash with your normal soap after using and rinsing off  the CHG Soap.                9.  Pat yourself dry with a clean towel.            10.  Wear clean pajamas.            11.  Place clean sheets on your bed the night of your first shower and do not  sleep with pets. Day of Surgery : Do not apply any lotions/deodorants the morning of surgery.  Please wear clean clothes to the hospital/surgery center.  FAILURE TO FOLLOW THESE INSTRUCTIONS MAY RESULT IN THE CANCELLATION OF YOUR SURGERY PATIENT SIGNATURE_________________________________  NURSE SIGNATURE__________________________________  ________________________________________________________________________    Veronica Krueger  An incentive spirometer is a tool that can help keep your lungs clear and active. This tool measures how well you are filling your lungs with each breath. Taking long deep breaths may help reverse or decrease the chance of developing breathing (pulmonary) problems (especially infection) following:  A long period of time when you are unable to move or be active. BEFORE THE PROCEDURE   If the spirometer includes an indicator to show your best effort, your nurse or respiratory therapist will set it to a desired goal.  If possible, sit up  straight or lean slightly forward. Try not to slouch.  Hold the incentive spirometer in an upright position. INSTRUCTIONS FOR USE  1. Sit on the edge of your bed if possible, or sit up as far as you can in bed or on a chair. 2. Hold the incentive spirometer in an upright position. 3. Breathe out normally. 4. Place the mouthpiece in your mouth and seal your lips tightly around it. 5. Breathe in slowly and as deeply as possible, raising the piston or the ball toward the top of the column. 6. Hold your breath for 3-5 seconds or for as long as possible. Allow the piston or ball to fall to the bottom of the column. 7. Remove the mouthpiece from your mouth and breathe out normally.  8. Rest for a few seconds and repeat Steps 1 through 7 at least 10 times every 1-2 hours when you are awake. Take your time and take a few normal breaths between deep breaths. 9. The spirometer may include an indicator to show your best effort. Use the indicator as a goal to work toward during each repetition. 10. After each set of 10 deep breaths, practice coughing to be sure your lungs are clear. If you have an incision (the cut made at the time of surgery), support your incision when coughing by placing a pillow or rolled up towels firmly against it. Once you are able to get out of bed, walk around indoors and cough well. You may stop using the incentive spirometer when instructed by your caregiver.  RISKS AND COMPLICATIONS  Take your time so you do not get dizzy or light-headed.  If you are in pain, you may need to take or ask for pain medication before doing incentive spirometry. It is harder to take a deep breath if you are having pain. AFTER USE  Rest and breathe slowly and easily.  It can be helpful to keep track of a log of your progress. Your caregiver can provide you with a simple table to help with this. If you are using the spirometer at home, follow these instructions: [place] IF:   You are  having difficultly using the spirometer.  You have trouble using the spirometer as often as instructed.  Your pain medication is not giving enough relief while using the spirometer.  You develop fever of 100.5 F (38.1 C) or higher. SEEK IMMEDIATE MEDICAL CARE IF:   You cough up bloody sputum that had not been present before.  You develop fever of 102 F (38.9 C) or greater.  You develop worsening pain at or near the incision site. MAKE SURE YOU:   Understand these instructions.  Will watch your condition.  Will get help right away if you are not doing well or get worse. Document Released: 06/22/2006 Document Revised: 05/04/2011 Document Reviewed: 08/23/2006 Exit[employer] Patient Information 2014 Exit[employer], [place].   ________________________________________________________________________  WHAT IS A BLOOD TRANSFUSION? Blood Transfusion Information  A transfusion is the replacement of blood or some of its parts. Blood is made up of multiple cells which provide different functions.  Red blood cells carry oxygen and are used for blood loss replacement.  White blood cells fight against infection.  Platelets control bleeding.  Plasma helps clot blood.  Other blood products are available for specialized needs, such as hemophilia or other clotting disorders. BEFORE THE TRANSFUSION  Who gives blood for transfusions?   Healthy volunteers who are fully evaluated to make sure their blood is safe. This is blood bank blood. Transfusion therapy is the safest it has ever been in the practice of medicine. Before blood is taken from a donor, a complete history is taken to make sure that person has no history of diseases nor engages in risky social behavior (examples are intravenous drug use or sexual activity with multiple partners). The donor's travel history is screened to minimize risk of transmitting infections, such as malaria. The donated blood is tested for signs of infectious diseases,  such as HIV and hepatitis. The blood is then tested to be sure it is compatible with you in order to minimize the chance of a transfusion reaction. If you or a relative donates blood, this is often done in anticipation of surgery and is not appropriate for emergency situations. It  takes many days to process the donated blood. RISKS AND COMPLICATIONS Although transfusion therapy is very safe and saves many lives, the main dangers of transfusion include:   Getting an infectious disease.  Developing a transfusion reaction. This is an allergic reaction to something in the blood you were given. Every precaution is taken to prevent this. The decision to have a blood transfusion has been considered carefully by your caregiver before blood is given. Blood is not given unless the benefits outweigh the risks. AFTER THE TRANSFUSION  Right after receiving a blood transfusion, you will usually feel much better and more energetic. This is especially true if your red blood cells have gotten low (anemic). The transfusion raises the level of the red blood cells which carry oxygen, and this usually causes an energy increase.  The nurse administering the transfusion will monitor you carefully for complications. HOME CARE INSTRUCTIONS  No special instructions are needed after a transfusion. You may find your energy is better. Speak with your caregiver about any limitations on activity for underlying diseases you may have. SEEK MEDICAL CARE IF:   Your condition is not improving after your transfusion.  You develop redness or irritation at the intravenous (IV) site. SEEK IMMEDIATE MEDICAL CARE IF:  Any of the following symptoms occur over the next 12 hours:  Shaking chills.  You have a temperature by mouth above 102 F (38.9 C), not controlled by medicine.  Chest, back, or muscle pain.  People around you feel you are not acting correctly or are confused.  Shortness of breath or difficulty  breathing.  Dizziness and fainting.  You get a rash or develop hives.  You have a decrease in urine output.  Your urine turns a dark color or changes to pink, red, or brown. Any of the following symptoms occur over the next 10 days:  You have a temperature by mouth above 102 F (38.9 C), not controlled by medicine.  Shortness of breath.  Weakness after normal activity.  The white part of the eye turns yellow (jaundice).  You have a decrease in the amount of urine or are urinating less often.  Your urine turns a dark color or changes to pink, red, or brown. Document Released: 02/07/2000 Document Revised: 05/04/2011 Document Reviewed: 09/26/2007 [facility] Patient Information 2014 [place], [place].  _______________________________________________________________________

## 2018-02-08 ENCOUNTER — Encounter (HOSPITAL_COMMUNITY): Payer: Self-pay | Admitting: *Deleted

## 2018-02-08 ENCOUNTER — Encounter (HOSPITAL_COMMUNITY)
Admission: RE | Disposition: A | Discharge status: Home / Self Care | Payer: Self-pay | Source: Home / Self Care | Attending: Surgery

## 2018-02-08 ENCOUNTER — Inpatient Hospital Stay (HOSPITAL_COMMUNITY): Payer: Medicare HMO | Admitting: Anesthesiology

## 2018-02-08 ENCOUNTER — Encounter (HOSPITAL_COMMUNITY): Payer: Self-pay

## 2018-02-08 ENCOUNTER — Inpatient Hospital Stay (HOSPITAL_COMMUNITY)
Admission: RE | Admit: 2018-02-08 | Discharge: 2018-02-09 | DRG: 621 | Disposition: A | Discharge status: Home / Self Care | Payer: Medicare HMO | Attending: Surgery | Admitting: Surgery

## 2018-02-08 DIAGNOSIS — I10 Essential (primary) hypertension: Secondary | ICD-10-CM | POA: Diagnosis present

## 2018-02-08 DIAGNOSIS — D509 Iron deficiency anemia, unspecified: Secondary | ICD-10-CM

## 2018-02-08 DIAGNOSIS — Z6841 Body Mass Index (BMI) 40.0 and over, adult: Secondary | ICD-10-CM

## 2018-02-08 DIAGNOSIS — D649 Anemia, unspecified: Secondary | ICD-10-CM | POA: Diagnosis present

## 2018-02-08 DIAGNOSIS — Z23 Encounter for immunization: Secondary | ICD-10-CM

## 2018-02-08 DIAGNOSIS — E119 Type 2 diabetes mellitus without complications: Secondary | ICD-10-CM | POA: Diagnosis not present

## 2018-02-08 DIAGNOSIS — I890 Lymphedema, not elsewhere classified: Secondary | ICD-10-CM | POA: Diagnosis not present

## 2018-02-08 DIAGNOSIS — K449 Diaphragmatic hernia without obstruction or gangrene: Secondary | ICD-10-CM | POA: Diagnosis not present

## 2018-02-08 DIAGNOSIS — Z7984 Long term (current) use of oral hypoglycemic drugs: Secondary | ICD-10-CM | POA: Diagnosis not present

## 2018-02-08 DIAGNOSIS — D631 Anemia in chronic kidney disease: Secondary | ICD-10-CM

## 2018-02-08 DIAGNOSIS — Z9884 Bariatric surgery status: Secondary | ICD-10-CM

## 2018-02-08 DIAGNOSIS — N189 Chronic kidney disease, unspecified: Secondary | ICD-10-CM

## 2018-02-08 DIAGNOSIS — Z79899 Other long term (current) drug therapy: Secondary | ICD-10-CM

## 2018-02-08 HISTORY — PX: LAPAROSCOPIC GASTRIC SLEEVE RESECTION: SHX5895

## 2018-02-08 LAB — CBC
HCT: 26.7 % — ABNORMAL LOW (ref 36.0–46.0)
HEMOGLOBIN: 7.1 g/dL — AB (ref 12.0–15.0)
MCH: 22.3 pg — ABNORMAL LOW (ref 26.0–34.0)
MCHC: 26.6 g/dL — ABNORMAL LOW (ref 30.0–36.0)
MCV: 84 fL (ref 80.0–100.0)
Platelets: 190 10*3/uL (ref 150–400)
RBC: 3.18 MIL/uL — ABNORMAL LOW (ref 3.87–5.11)
RDW: 16.8 % — ABNORMAL HIGH (ref 11.5–15.5)
WBC: 10 10*3/uL (ref 4.0–10.5)
nRBC: 0 % (ref 0.0–0.2)

## 2018-02-08 LAB — ABO/RH: ABO/RH(D): A POS

## 2018-02-08 LAB — CREATININE, SERUM
Creatinine, Ser: 1.39 mg/dL — ABNORMAL HIGH (ref 0.44–1.00)
GFR calc Af Amer: 48 mL/min — ABNORMAL LOW (ref >60)
GFR calc non Af Amer: 41 mL/min — ABNORMAL LOW (ref >60)

## 2018-02-08 LAB — TYPE AND SCREEN
ABO/RH(D): A POS
ANTIBODY SCREEN: NEGATIVE

## 2018-02-08 LAB — GLUCOSE, CAPILLARY
Glucose-Capillary: 250 mg/dL — ABNORMAL HIGH (ref 70–99)
Glucose-Capillary: 228 mg/dL — ABNORMAL HIGH (ref 70–99)
Glucose-Capillary: 172 mg/dL — ABNORMAL HIGH (ref 70–99)

## 2018-02-08 SURGERY — GASTRECTOMY, SLEEVE, LAPAROSCOPIC
Anesthesia: General | Site: Abdomen

## 2018-02-08 MED ORDER — FENTANYL CITRATE (PF) 100 MCG/2ML IJ SOLN
INTRAMUSCULAR | Status: DC | PRN
  Administered 2018-02-08 (×2): 50 ug via INTRAVENOUS

## 2018-02-08 MED ORDER — SCOPOLAMINE 1 MG/3DAYS TD PT72
1.0000 | MEDICATED_PATCH | TRANSDERMAL | Status: DC
  Administered 2018-02-08: 1.5 mg via TRANSDERMAL
  Filled 2018-02-08: qty 1

## 2018-02-08 MED ORDER — OXYCODONE HCL 5 MG/5ML PO SOLN: 5.0000 mg | ORAL | Status: DC | PRN

## 2018-02-08 MED ORDER — LIDOCAINE 2% (20 MG/ML) 5 ML SYRINGE
INTRAMUSCULAR | Status: DC | PRN
  Administered 2018-02-08: 60 mg via INTRAVENOUS

## 2018-02-08 MED ORDER — ROCURONIUM BROMIDE 10 MG/ML (PF) SYRINGE
PREFILLED_SYRINGE | INTRAVENOUS | Status: DC | PRN
  Administered 2018-02-08: 100 mg via INTRAVENOUS

## 2018-02-08 MED ORDER — INSULIN ASPART 100 UNIT/ML ~~LOC~~ SOLN
0.0000 [IU] | SUBCUTANEOUS | Status: DC
  Administered 2018-02-08: 7 [IU] via SUBCUTANEOUS
  Administered 2018-02-08: 8 [IU] via SUBCUTANEOUS
  Administered 2018-02-08: 4 [IU] via SUBCUTANEOUS
  Administered 2018-02-09: 7 [IU] via SUBCUTANEOUS
  Administered 2018-02-09: 3 [IU] via SUBCUTANEOUS
  Administered 2018-02-09: 7 [IU] via SUBCUTANEOUS
  Administered 2018-02-09: 4 [IU] via SUBCUTANEOUS

## 2018-02-08 MED ORDER — SODIUM CHLORIDE 0.9 % IV SOLN
2.0000 g | INTRAVENOUS | Status: AC
  Administered 2018-02-08: 2 g via INTRAVENOUS
  Filled 2018-02-08: qty 2

## 2018-02-08 MED ORDER — FENTANYL CITRATE (PF) 100 MCG/2ML IJ SOLN
25.0000 ug | INTRAMUSCULAR | Status: DC | PRN
  Administered 2018-02-08 (×2): 50 ug via INTRAVENOUS

## 2018-02-08 MED ORDER — PROPOFOL 10 MG/ML IV BOLUS
INTRAVENOUS | Status: DC | PRN
  Administered 2018-02-08: 200 mg via INTRAVENOUS

## 2018-02-08 MED ORDER — 0.9 % SODIUM CHLORIDE (POUR BTL) OPTIME
TOPICAL | Status: DC | PRN
  Administered 2018-02-08: 1000 mL

## 2018-02-08 MED ORDER — SUGAMMADEX SODIUM 500 MG/5ML IV SOLN
INTRAVENOUS | Status: DC | PRN
  Administered 2018-02-08: 400 mg via INTRAVENOUS

## 2018-02-08 MED ORDER — MIDAZOLAM HCL 2 MG/2ML IJ SOLN
INTRAMUSCULAR | Status: AC
  Filled 2018-02-08: qty 2

## 2018-02-08 MED ORDER — ENOXAPARIN (LOVENOX) PATIENT EDUCATION KIT
PACK | Freq: Once | Status: DC
  Filled 2018-02-08: qty 1

## 2018-02-08 MED ORDER — SODIUM CHLORIDE (PF) 0.9 % IJ SOLN
INTRAMUSCULAR | Status: DC | PRN
  Administered 2018-02-08: 10 mL

## 2018-02-08 MED ORDER — APREPITANT 40 MG PO CAPS
40.0000 mg | ORAL_CAPSULE | ORAL | Status: AC
  Administered 2018-02-08: 40 mg via ORAL
  Filled 2018-02-08: qty 1

## 2018-02-08 MED ORDER — DORZOLAMIDE HCL 2 % OP SOLN
1.0000 [drp] | Freq: Two times a day (BID) | OPHTHALMIC | Status: DC
  Administered 2018-02-08 – 2018-02-09 (×2): 1 [drp] via OPHTHALMIC
  Filled 2018-02-08: qty 10

## 2018-02-08 MED ORDER — MEPERIDINE HCL 50 MG/ML IJ SOLN: 6.2500 mg | INTRAMUSCULAR | Status: DC | PRN

## 2018-02-08 MED ORDER — PANTOPRAZOLE SODIUM 40 MG IV SOLR
40.0000 mg | Freq: Every day | INTRAVENOUS | Status: DC
  Administered 2018-02-08: 40 mg via INTRAVENOUS
  Filled 2018-02-08: qty 40

## 2018-02-08 MED ORDER — DEXAMETHASONE SODIUM PHOSPHATE 10 MG/ML IJ SOLN
INTRAMUSCULAR | Status: AC
  Filled 2018-02-08: qty 1

## 2018-02-08 MED ORDER — SUGAMMADEX SODIUM 500 MG/5ML IV SOLN
INTRAVENOUS | Status: AC
  Filled 2018-02-08: qty 5

## 2018-02-08 MED ORDER — FENTANYL CITRATE (PF) 100 MCG/2ML IJ SOLN
INTRAMUSCULAR | Status: AC
  Filled 2018-02-08: qty 4

## 2018-02-08 MED ORDER — LACTATED RINGERS IR SOLN
Status: DC | PRN
  Administered 2018-02-08: 1000 mL

## 2018-02-08 MED ORDER — TIMOLOL MALEATE 0.5 % OP SOLN
1.0000 [drp] | Freq: Two times a day (BID) | OPHTHALMIC | Status: DC
  Administered 2018-02-08 – 2018-02-09 (×2): 1 [drp] via OPHTHALMIC
  Filled 2018-02-08: qty 5

## 2018-02-08 MED ORDER — ACETAMINOPHEN 500 MG PO TABS
1000.0000 mg | ORAL_TABLET | ORAL | Status: AC
  Administered 2018-02-08: 1000 mg via ORAL
  Filled 2018-02-08: qty 2

## 2018-02-08 MED ORDER — INSULIN ASPART 100 UNIT/ML ~~LOC~~ SOLN
SUBCUTANEOUS | Status: AC
  Filled 2018-02-08: qty 1

## 2018-02-08 MED ORDER — METOCLOPRAMIDE HCL 5 MG/ML IJ SOLN: 10.0000 mg | Freq: Once | INTRAMUSCULAR | Status: DC | PRN

## 2018-02-08 MED ORDER — LIDOCAINE 2% (20 MG/ML) 5 ML SYRINGE
INTRAMUSCULAR | Status: DC | PRN
  Administered 2018-02-08: 1.5 mg/kg/h via INTRAVENOUS

## 2018-02-08 MED ORDER — GABAPENTIN 300 MG PO CAPS: 300.0000 mg | ORAL_CAPSULE | ORAL | Status: DC

## 2018-02-08 MED ORDER — DEXAMETHASONE SODIUM PHOSPHATE 10 MG/ML IJ SOLN
INTRAMUSCULAR | Status: DC | PRN
  Administered 2018-02-08: 8 mg via INTRAVENOUS

## 2018-02-08 MED ORDER — GABAPENTIN 100 MG PO CAPS
200.0000 mg | ORAL_CAPSULE | Freq: Two times a day (BID) | ORAL | Status: DC
  Filled 2018-02-08 (×2): qty 2

## 2018-02-08 MED ORDER — KCL IN DEXTROSE-NACL 20-5-0.45 MEQ/L-%-% IV SOLN
INTRAVENOUS | Status: DC
  Administered 2018-02-08 – 2018-02-09 (×2): via INTRAVENOUS
  Filled 2018-02-08 (×2): qty 1000

## 2018-02-08 MED ORDER — METOPROLOL TARTRATE 5 MG/5ML IV SOLN: 5.0000 mg | Freq: Four times a day (QID) | INTRAVENOUS | Status: DC | PRN

## 2018-02-08 MED ORDER — LATANOPROST 0.005 % OP SOLN
1.0000 [drp] | Freq: Every day | OPHTHALMIC | Status: DC
  Administered 2018-02-08: 1 [drp] via OPHTHALMIC
  Filled 2018-02-08: qty 2.5

## 2018-02-08 MED ORDER — LIDOCAINE HCL 2 % IJ SOLN
INTRAMUSCULAR | Status: AC
  Filled 2018-02-08: qty 20

## 2018-02-08 MED ORDER — LIDOCAINE 2% (20 MG/ML) 5 ML SYRINGE
INTRAMUSCULAR | Status: AC
  Filled 2018-02-08: qty 5

## 2018-02-08 MED ORDER — HEPARIN SODIUM (PORCINE) 5000 UNIT/ML IJ SOLN
5000.0000 [IU] | Freq: Three times a day (TID) | INTRAMUSCULAR | Status: DC
  Administered 2018-02-08 – 2018-02-09 (×3): 5000 [IU] via SUBCUTANEOUS
  Filled 2018-02-08 (×3): qty 1

## 2018-02-08 MED ORDER — ONDANSETRON HCL 4 MG/2ML IJ SOLN
4.0000 mg | INTRAMUSCULAR | Status: DC | PRN
  Administered 2018-02-08: 4 mg via INTRAVENOUS
  Filled 2018-02-08: qty 2

## 2018-02-08 MED ORDER — ROCURONIUM BROMIDE 10 MG/ML (PF) SYRINGE
PREFILLED_SYRINGE | INTRAVENOUS | Status: AC
  Filled 2018-02-08: qty 10

## 2018-02-08 MED ORDER — PROPOFOL 10 MG/ML IV BOLUS
INTRAVENOUS | Status: AC
  Filled 2018-02-08: qty 20

## 2018-02-08 MED ORDER — SODIUM CHLORIDE (PF) 0.9 % IJ SOLN
INTRAMUSCULAR | Status: AC
  Filled 2018-02-08: qty 10

## 2018-02-08 MED ORDER — DEXAMETHASONE SODIUM PHOSPHATE 4 MG/ML IJ SOLN: 4.0000 mg | INTRAMUSCULAR | Status: DC

## 2018-02-08 MED ORDER — KETAMINE HCL 10 MG/ML IJ SOLN
INTRAMUSCULAR | Status: DC | PRN
  Administered 2018-02-08 (×2): 25 mg via INTRAVENOUS

## 2018-02-08 MED ORDER — MORPHINE SULFATE (PF) 2 MG/ML IV SOLN
1.0000 mg | INTRAVENOUS | Status: DC | PRN
  Administered 2018-02-08: 2 mg via INTRAVENOUS
  Filled 2018-02-08: qty 1

## 2018-02-08 MED ORDER — PREMIER PROTEIN SHAKE
2.0000 [oz_av] | ORAL | Status: DC
  Administered 2018-02-09: 2 [oz_av] via ORAL

## 2018-02-08 MED ORDER — EPHEDRINE SULFATE-NACL 50-0.9 MG/10ML-% IV SOSY
PREFILLED_SYRINGE | INTRAVENOUS | Status: DC | PRN
  Administered 2018-02-08: 5 mg via INTRAVENOUS
  Administered 2018-02-08: 15 mg via INTRAVENOUS
  Administered 2018-02-08: 5 mg via INTRAVENOUS

## 2018-02-08 MED ORDER — EPHEDRINE 5 MG/ML INJ
INTRAVENOUS | Status: AC
  Filled 2018-02-08: qty 10

## 2018-02-08 MED ORDER — METOPROLOL TARTRATE 25 MG PO TABS
25.0000 mg | ORAL_TABLET | Freq: Two times a day (BID) | ORAL | Status: DC
  Administered 2018-02-08 – 2018-02-09 (×2): 25 mg via ORAL
  Filled 2018-02-08 (×2): qty 1

## 2018-02-08 MED ORDER — FENTANYL CITRATE (PF) 100 MCG/2ML IJ SOLN
INTRAMUSCULAR | Status: AC
  Filled 2018-02-08: qty 2

## 2018-02-08 MED ORDER — INSULIN ASPART 100 UNIT/ML ~~LOC~~ SOLN: 8.0000 [IU] | Freq: Once | SUBCUTANEOUS | Status: DC

## 2018-02-08 MED ORDER — ONDANSETRON HCL 4 MG/2ML IJ SOLN
INTRAMUSCULAR | Status: AC
  Filled 2018-02-08: qty 2

## 2018-02-08 MED ORDER — ACETAMINOPHEN 160 MG/5ML PO SOLN
650.0000 mg | Freq: Four times a day (QID) | ORAL | Status: DC
  Administered 2018-02-08 – 2018-02-09 (×3): 650 mg via ORAL
  Filled 2018-02-08 (×3): qty 20.3

## 2018-02-08 MED ORDER — BUPIVACAINE LIPOSOME 1.3 % IJ SUSP
INTRAMUSCULAR | Status: DC | PRN
  Administered 2018-02-08: 20 mL

## 2018-02-08 MED ORDER — LACTATED RINGERS IV SOLN
INTRAVENOUS | Status: DC
  Administered 2018-02-08: 12:00:00 via INTRAVENOUS

## 2018-02-08 MED ORDER — ONDANSETRON HCL 4 MG/2ML IJ SOLN
INTRAMUSCULAR | Status: DC | PRN
  Administered 2018-02-08: 4 mg via INTRAVENOUS

## 2018-02-08 MED ORDER — HEPARIN SODIUM (PORCINE) 5000 UNIT/ML IJ SOLN
5000.0000 [IU] | INTRAMUSCULAR | Status: AC
  Administered 2018-02-08: 5000 [IU] via SUBCUTANEOUS
  Filled 2018-02-08: qty 1

## 2018-02-08 MED ORDER — MIDAZOLAM HCL 5 MG/5ML IJ SOLN
INTRAMUSCULAR | Status: DC | PRN
  Administered 2018-02-08: 0.5 mg via INTRAVENOUS

## 2018-02-08 SURGICAL SUPPLY — 59 items
APPLICATOR COTTON TIP 6 STRL (MISCELLANEOUS) IMPLANT
APPLICATOR COTTON TIP 6IN STRL (MISCELLANEOUS)
APPLIER CLIP 5 13 M/L LIGAMAX5 (MISCELLANEOUS)
APPLIER CLIP ROT 10 11.4 M/L (STAPLE)
APPLIER CLIP ROT 13.4 12 LRG (CLIP)
BAG LAPAROSCOPIC 12 15 PORT 16 (BASKET) ×1 IMPLANT
BAG RETRIEVAL 12/15 (BASKET) ×2
BLADE SURG 15 STRL LF DISP TIS (BLADE) ×1 IMPLANT
BLADE SURG 15 STRL SS (BLADE) ×1
CABLE HIGH FREQUENCY MONO STRZ (ELECTRODE) ×2 IMPLANT
CLIP APPLIE 5 13 M/L LIGAMAX5 (MISCELLANEOUS) IMPLANT
CLIP APPLIE ROT 10 11.4 M/L (STAPLE) IMPLANT
CLIP APPLIE ROT 13.4 12 LRG (CLIP) IMPLANT
COVER WAND RF STERILE (DRAPES) ×2 IMPLANT
DERMABOND ADVANCED (GAUZE/BANDAGES/DRESSINGS) ×1
DERMABOND ADVANCED .7 DNX12 (GAUZE/BANDAGES/DRESSINGS) ×1 IMPLANT
DEVICE SUT QUICK LOAD TK 5 (STAPLE) ×2 IMPLANT
DEVICE SUT TI-KNOT TK 5X26 (MISCELLANEOUS) ×2 IMPLANT
DEVICE SUTURE ENDOST 10MM (ENDOMECHANICALS) ×2 IMPLANT
DISSECTOR BLUNT TIP ENDO 5MM (MISCELLANEOUS) ×2 IMPLANT
ELECT REM PT RETURN 15FT ADLT (MISCELLANEOUS) ×2 IMPLANT
GAUZE SPONGE 4X4 12PLY STRL (GAUZE/BANDAGES/DRESSINGS) IMPLANT
GLOVE BIOGEL M 8.0 STRL (GLOVE) ×2 IMPLANT
GOWN STRL REUS W/TWL XL LVL3 (GOWN DISPOSABLE) ×8 IMPLANT
GRASPER SUT TROCAR 14GX15 (MISCELLANEOUS) ×2 IMPLANT
HANDLE STAPLE EGIA 4 XL (STAPLE) ×2 IMPLANT
HOVERMATT SINGLE USE (MISCELLANEOUS) ×2 IMPLANT
KIT BASIN OR (CUSTOM PROCEDURE TRAY) ×2 IMPLANT
MARKER SKIN DUAL TIP RULER LAB (MISCELLANEOUS) ×2 IMPLANT
NEEDLE SPNL 22GX3.5 QUINCKE BK (NEEDLE) ×2 IMPLANT
PACK UNIVERSAL I (CUSTOM PROCEDURE TRAY) ×2 IMPLANT
RELOAD TRI 45 ART MED THCK BLK (STAPLE) ×2 IMPLANT
RELOAD TRI 45 ART MED THCK PUR (STAPLE) ×2 IMPLANT
RELOAD TRI 60 ART MED THCK BLK (STAPLE) ×4 IMPLANT
RELOAD TRI 60 ART MED THCK PUR (STAPLE) ×2 IMPLANT
SCISSORS LAP 5X45 EPIX DISP (ENDOMECHANICALS) IMPLANT
SET IRRIG TUBING LAPAROSCOPIC (IRRIGATION / IRRIGATOR) ×2 IMPLANT
SHEARS HARMONIC ACE PLUS 45CM (MISCELLANEOUS) ×2 IMPLANT
SLEEVE ADV FIXATION 5X100MM (TROCAR) ×4 IMPLANT
SLEEVE GASTRECTOMY 36FR VISIGI (MISCELLANEOUS) ×2 IMPLANT
SOLUTION ANTI FOG 6CC (MISCELLANEOUS) ×2 IMPLANT
SPONGE LAP 18X18 RF (DISPOSABLE) ×2 IMPLANT
STAPLER VISISTAT 35W (STAPLE) ×2 IMPLANT
SUT MNCRL AB 4-0 PS2 18 (SUTURE) ×4 IMPLANT
SUT SURGIDAC NAB ES-9 0 48 120 (SUTURE) IMPLANT
SUT VICRYL 0 TIES 12 18 (SUTURE) ×2 IMPLANT
SYR 10ML ECCENTRIC (SYRINGE) ×2 IMPLANT
SYR 20CC LL (SYRINGE) ×2 IMPLANT
SYR 50ML LL SCALE MARK (SYRINGE) ×2 IMPLANT
TOWEL OR 17X26 10 PK STRL BLUE (TOWEL DISPOSABLE) ×4 IMPLANT
TOWEL OR NON WOVEN STRL DISP B (DISPOSABLE) ×2 IMPLANT
TRAY FOLEY MTR SLVR 16FR STAT (SET/KITS/TRAYS/PACK) IMPLANT
TROCAR ADV FIXATION 5X100MM (TROCAR) ×2 IMPLANT
TROCAR BLADELESS 15MM (ENDOMECHANICALS) ×2 IMPLANT
TROCAR BLADELESS OPT 5 100 (ENDOMECHANICALS) ×2 IMPLANT
TUBE CALIBRATION LAPBAND (TUBING) ×2 IMPLANT
TUBING CONNECTING 10 (TUBING) ×4 IMPLANT
TUBING ENDO SMARTCAP (MISCELLANEOUS) ×2 IMPLANT
TUBING INSUF HEATED (TUBING) ×2 IMPLANT

## 2018-02-08 NOTE — Progress Notes (Signed)
PHARMACY CONSULT FOR:  Risk Assessment for Post-Discharge VTE Following Bariatric Surgery  Post-Discharge VTE Risk Assessment: This patient's probability of 30-day post-discharge VTE is increased due to the factors marked:   Female  x Age >/=60 years  x  BMI >/=50 kg/m2    CHF    Dyspnea at Rest    Paraplegia   x Non-gastric-band surgery    Operation Time >/=3 hr    Return to OR     Length of Stay >/= 3 d   Predicted probability of 30-day post-discharge VTE: 0.52%   Other patient-specific factors to consider: - No noted PMH of PE/DVT  Recommendation for Discharge: Enoxaparin 60 mg [place] q12h x 2 weeks post-discharge       Veronica Krueger is a 62 y.o. female who underwent single suture posterior repair of hiatal hernia with Laparoscopic sleeve gastrectomy and upper endoscopy on 02/08/2018    Case start: 1309 Case end: 1426   Allergies  Allergen Reactions  . Gabapentin Other (See Comments)    Hair loss   . Penicillins Itching and Other (See Comments)    Has patient had a PCN reaction causing immediate rash, facial/tongue/throat swelling, SOB or lightheadedness with hypotension:  Has patient had a PCN reaction causing severe rash involving mucus membranes or skin necrosis:  Has patient had a PCN reaction that required hospitalization: Has patient had a PCN reaction occurring within the last 10 years: No If all of the above answers are "NO", then may proceed with Cephalosporin use.     Patient Measurements: Height: 5\' 9"  (175.3 cm) Weight: (!) 419 lb 8 oz (190.3 kg) IBW/kg (Calculated) : 66.2 Body mass index is 61.95 kg/m.  Recent Labs    02/07/18 1220  WBC 10.0  HGB 7.7*  HCT 28.4*  PLT 212  CREATININE 1.42*  ALBUMIN 3.6  PROT 8.4*  AST 9*  ALT 12  ALKPHOS 67  BILITOT 0.6   Estimated Creatinine Clearance: 77 mL/min (A) (by C-G formula based on SCr of 1.42 mg/dL (H)).    Past Medical History:  Diagnosis Date  . Diabetes mellitus without  complication ([place])    type 2   . Hypertension   . Left hand weakness    related to previous stomach abscess surgery   . Lymph edema    legs      Medications Prior to Admission  Medication Sig Dispense Refill Last Dose  . acetaminophen (TYLENOL) 500 MG tablet Take 1,000 mg by mouth every 6 (six) hours as needed for moderate pain or headache.    02/08/2018 at 0200  . dorzolamide (TRUSOPT) 2 % ophthalmic solution Place 1 drop into the right eye 2 (two) times daily.   99 02/08/2018 at 0815  . glipiZIDE (GLUCOTROL) 5 MG tablet Take 5 mg by mouth daily.  3 Past Week at Unknown time  . latanoprost (XALATAN) 0.005 % ophthalmic solution Place 1 drop into the right eye at bedtime.    02/07/2018 at 2330  . lisinopril-hydrochlorothiazide (PRINZIDE,ZESTORETIC) 10-12.5 MG tablet Take 1 tablet by mouth 2 (two) times daily.  3 02/07/2018 at 1900  . metoprolol tartrate (LOPRESSOR) 25 MG tablet Take 25 mg by mouth 2 (two) times daily.   02/08/2018 at 0800  . timolol (TIMOPTIC) 0.5 % ophthalmic solution Place 1 drop into both eyes 2 (two) times daily.   4 02/08/2018 at [place], PharmD, BCPS Pager [phone] 02/08/2018 2:48 PM

## 2018-02-08 NOTE — Interval H&P Note (Signed)
History and Physical Interval Note:  02/08/2018 11:26 AM  Veronica Krueger  has presented today for surgery, with the diagnosis of Morbid Obesity, HTN, DM II  The various methods of treatment have been discussed with the patient and family. After consideration of risks, benefits and other options for treatment, the patient has consented to  Procedure(s): LAPAROSCOPIC GASTRIC SLEEVE RESECTION, UPPER ENDO, ERAS Pathway (N/A) as a surgical intervention .  The patient's history has been reviewed, patient examined, no change in status, stable for surgery.  I have reviewed the patient's chart and labs.  Questions were answered to the patient's satisfaction.     Pedro Earls

## 2018-02-08 NOTE — Progress Notes (Signed)
Patient alert and oriented.  No questions at this time.  Lovenox teaching kit ordered per protocol

## 2018-02-08 NOTE — Discharge Instructions (Signed)
° ° ° °GASTRIC BYPASS/SLEEVE ° Home Care Instructions ° ° These instructions are to help you care for yourself when you go home. ° °Call: If you have any problems. °• Call [phone] and ask for the surgeon on call °• If you need immediate help, come to the ER at [facility].  °• Tell the ER staff that you are a new post-op gastric bypass or gastric sleeve patient °  °Signs and symptoms to report: • Severe vomiting or nausea °o If you cannot keep down clear liquids for longer than 1 day, call your surgeon  °• Abdominal pain that does not get better after taking your pain medication °• Fever over 100.4° F with chills °• Heart beating over 100 beats a minute °• Shortness of breath at rest °• Chest pain °•  Redness, swelling, drainage, or foul odor at incision (surgical) sites °•  If your incisions open or pull apart °• Swelling or pain in calf (lower leg) °• Diarrhea (Loose bowel movements that happen often), frequent watery, uncontrolled bowel movements °• Constipation, (no bowel movements for 3 days) if this happens: Pick one °o Milk of Magnesia, 2 tablespoons by mouth, 3 times a day for 2 days if needed °o Stop taking Milk of Magnesia once you have a bowel movement °o Call your doctor if constipation continues °Or °o Miralax  (instead of Milk of Magnesia) following the label instructions °o Stop taking Miralax once you have a bowel movement °o Call your doctor if constipation continues °• Anything you think is not normal °  °Normal side effects after surgery: • Unable to sleep at night or unable to focus °• Irritability or moody °• Being tearful (crying) or depressed °These are common complaints, possibly related to your anesthesia medications that put you to sleep, stress of surgery, and change in lifestyle.  This usually goes away a few weeks after surgery.  If these feelings continue, call your primary care doctor. °  °Wound Care: You may have surgical glue, steri-strips, or staples over your incisions after  surgery °• Surgical glue:  Looks like a clear film over your incisions and will wear off a little at a time °• Steri-strips: Strips of tape over your incisions. You may notice a yellowish color on the skin under the steri-strips. This is used to make the   steri-strips stick better. Do not pull the steri-strips off - let them fall off °• Staples: Staples may be removed before you leave the hospital °o If you go home with staples, call [facility], [phone] at for an appointment with your surgeon’s nurse to have staples removed 10 days after surgery. °• Showering: You may shower two (2) days after your surgery unless your surgeon tells you differently °o Wash gently around incisions with warm soapy water, rinse well, and gently pat dry  °o No tub baths until staples are removed, steri-strips fall off or glue is gone.  °  °Medications: • Medications should be liquid or crushed if larger than the size of a dime °• Extended release pills (medication that release a little bit at a time through the day) should NOT be crushed or cut. (examples include XL, ER, DR, SR) °• Depending on the size and number of medications you take, you may need to space (take a few throughout the day)/change the time you take your medications so that you do not over-fill your pouch (smaller stomach) °• Make sure you follow-up with your primary care doctor to   make medication changes needed during rapid weight loss and life-style changes °• If you have diabetes, follow up with the doctor that orders your diabetes medication(s) within one week after surgery and check your blood sugar regularly. °• Do not drive while taking prescription pain medication  °• It is ok to take Tylenol by the bottle instructions with your pain medicine or instead of your pain medicine as needed.  DO NOT TAKE NSAIDS (EXAMPLES OF NSAIDS:  IBUPROFREN/ NAPROXEN)  °Diet:                    First 2 Weeks ° You will see the dietician t about two (2) weeks  after your surgery. The dietician will increase the types of foods you can eat if you are handling liquids well: °• If you have severe vomiting or nausea and cannot keep down clear liquids lasting longer than 1 day, call your surgeon @ ([phone]) °Protein Shake °• Drink at least 2 ounces of shake 5-6 times per day °• Each serving of protein shakes (usually 8 - 12 ounces) should have: °o 15 grams of protein  °o And no more than 5 grams of carbohydrate  °• Goal for protein each day: °o Men = 80 grams per day °o Women = 60 grams per day °• Protein powder may be added to fluids such as non-fat milk or Lactaid milk or unsweetened Soy/Almond milk (limit to 35 grams added protein powder per serving) ° °Hydration °• Slowly increase the amount of water and other clear liquids as tolerated (See Acceptable Fluids) °• Slowly increase the amount of protein shake as tolerated  °•  Sip fluids slowly and throughout the day.  Do not use straws. °• May use sugar substitutes in small amounts (no more than 6 - 8 packets per day; i.e. Splenda) ° °Fluid Goal °• The first goal is to drink at least 8 ounces of protein shake/drink per day (or as directed by the nutritionist); some examples of protein shakes are Syntrax Nectar, Adkins Advantage, EAS Edge HP, and Unjury. See handout from pre-op Bariatric Education Class: °o Slowly increase the amount of protein shake you drink as tolerated °o You may find it easier to slowly sip shakes throughout the day °o It is important to get your proteins in first °• Your fluid goal is to drink 64 - 100 ounces of fluid daily °o It may take a few weeks to build up to this °• 32 oz (or more) should be clear liquids  °And  °• 32 oz (or more) should be full liquids (see below for examples) °• Liquids should not contain sugar, caffeine, or carbonation ° °Clear Liquids: °• Water or Sugar-free flavored water (i.e. Fruit H2O, Propel) °• Decaffeinated coffee or tea (sugar-free) °• Crystal Lite, Wyler’s Lite,  Minute Maid Lite °• Sugar-free Jell-O °• Bouillon or broth °• Sugar-free Popsicle:   *Less than 20 calories each; Limit 1 per day ° °Full Liquids: °Protein Shakes/Drinks + 2 choices per day of other full liquids °• Full liquids must be: °o No More Than 15 grams of Carbs per serving  °o No More Than 3 grams of Fat per serving °• Strained low-fat cream soup (except Cream of Potato or Tomato) °• Non-Fat milk °• Fat-free Lactaid Milk °• Unsweetened Soy Or Unsweetened Almond Milk °• Low Sugar yogurt (Dannon Lite & Fit, Greek yogurt; Oikos Triple Zero; Chobani Simply 100; Yoplait 100 calorie Greek - No Fruit on the Bottom) ° °  °Vitamins   and Minerals • Start 1 day after surgery unless otherwise directed by your surgeon °• 2 Chewable Bariatric Specific Multivitamin / Multimineral Supplement with iron (Example: Bariatric Advantage Multi EA) °• Chewable Calcium with Vitamin D-3 °(Example: 3 Chewable Calcium Plus 600 with Vitamin D-3) °o Take 500 mg three (3) times a day for a total of 1500 mg each day °o Do not take all 3 doses of calcium at one time as it may cause constipation, and you can only absorb 500 mg  at a time  °o Do not mix multivitamins containing iron with calcium supplements; take 2 hours apart °• Menstruating women and those with a history of anemia (a blood disease that causes weakness) may need extra iron °o Talk with your doctor to see if you need more iron °• Do not stop taking or change any vitamins or minerals until you talk to your dietitian or surgeon °• Your Dietitian and/or surgeon must approve all vitamin and mineral supplements °  °Activity and Exercise: Limit your physical activity as instructed by your doctor.  It is important to continue walking at home.  During this time, use these guidelines: °• Do not lift anything greater than ten (10) pounds for at least two (2) weeks °• Do not go back to work or drive until your surgeon says you can °• You may have sex when you feel comfortable  °o It is  VERY important for female patients to use a reliable birth control method; fertility often increases after surgery  °o All hormonal birth control will be ineffective for 30 days after surgery due to medications given during surgery a barrier method must be used. °o Do not get pregnant for at least 18 months °• Start exercising as soon as your doctor tells you that you can °o Make sure your doctor approves any physical activity °• Start with a simple walking program °• Walk 5-15 minutes each day, 7 days per week.  °• Slowly increase until you are walking 30-45 minutes per day °Consider joining our BELT program. [phone] or email [email or web address] °  °Special Instructions Things to remember: °• Use your CPAP when sleeping if this applies to you ° °• [facility] has two free Bariatric Surgery Support Groups that meet monthly °o The 3rd Thursday of each month, 6 pm, [facility]  °o The 2nd Friday of each month, 11:45 am in the private dining room in the basement of [facility] °• It is very important to keep all follow up appointments with your surgeon, dietitian, primary care physician, and behavioral health practitioner °• Routine follow up schedule with your surgeon include appointments at 2-3 weeks, 6-8 weeks, 6 months, and 1 year at a minimum.  Your surgeon may request to see you more often.   °o After the first year, please follow up with your bariatric surgeon and dietitian at least once a year in order to maintain best weight loss results °[facility]: [phone] °[facility]: [phone] °Bariatric Nurse Coordinator: [phone] °  °   Reviewed and Endorsed  °by [facility] Patient Education Committee, June, 2016 °Edits Approved: Aug, 2018 ° ° ° °

## 2018-02-08 NOTE — Op Note (Signed)
08 February 2018  Surgeon: Kaylyn Lim, MD, FACS  Asst:  Romana Juniper, MD, FACS  Anes:  General endotracheal  Procedure: Single suture posterior repair of hiatal hernia with Laparoscopic sleeve gastrectomy and upper endoscopy  Diagnosis: Morbid obesity  Complications: None noted  EBL:   minimal cc  Description of Procedure:  The patient was take to OR 1 and given general anesthesia.  The abdomen was prepped with Technicare and draped sterilely.  A timeout was performed.  Access to the abdomen was achieved with a 5 mm Optiview through the right upper quadrant.  Following insufflation, the state of the abdomen was found to be free of adhesions.  The balloon test was positive.  Posterior dissection was performed and a single 0 surgidek was placed.  The ViSiGi 36Fr tube was inserted to deflate the stomach and was pulled back into the esophagus.    The pylorus was identified and we measured 5 cm back and marked the antrum.  At that point we began dissection to take down the greater curvature of the stomach using the Harmonic scalpel.  This dissection was taken all the way up to the left crus.  Posterior attachments of the stomach were also taken down.    The ViSiGi tube was then passed into the antrum and suction applied so that it was snug along the lessor curvature.  The "crow's foot" or incisura was identified.  The sleeve gastrectomy was begun using the [employer] stapler beginning with a 4.5 black with TRS followed by two black 6 cm with TRS followed by purple at the upper part.  When the sleeve was complete the tube was taken off suction and insufflated briefly.  The tube was withdrawn.  Upper endoscopy was then performed by Dr. Kae Heller.     The specimen was extracted through the 15 trocar site.  Local was provided by infiltrating with Exparel and closed 4-0 Monocryl and Dermabond.    Matt B. Hassell Done, [place], [facility], [place]

## 2018-02-08 NOTE — Progress Notes (Signed)
Responded to PIV consult. Pt assessed for PIV by 2 VAS Team RN's. Current PIV in RAC. No signs of infiltration or obstruction. RN reports she found a kinked area in the IV tubing and resolved this. Informed RN that only other vein found appropriate for PIV access in also in [facility].  RN to wrap current PIV site for support to prevent kinking.

## 2018-02-08 NOTE — Anesthesia Postprocedure Evaluation (Signed)
Anesthesia Post Note  Patient: Veronica Krueger  Procedure(s) Performed: LAPAROSCOPIC GASTRIC SLEEVE RESECTION WITH HIATAL HERNIA REPAIR AND UPPER ENDO, ERAS Pathway (N/A Abdomen)     Patient location during evaluation: PACU Anesthesia Type: General Level of consciousness: awake and alert Pain management: pain level controlled Vital Signs Assessment: post-procedure vital signs reviewed and stable Respiratory status: spontaneous breathing, nonlabored ventilation, respiratory function stable and patient connected to nasal cannula oxygen Cardiovascular status: blood pressure returned to baseline and stable Postop Assessment: no apparent nausea or vomiting Anesthetic complications: no    Last Vitals:  Vitals:   02/08/18 1554 02/08/18 1701  BP: (!) 143/47 (!) 159/69  Pulse: 63 67  Resp:  17  Temp: (!) 36.4 C (!) 36.3 C  SpO2: 100% 100%    Last Pain:  Vitals:   02/08/18 1701  TempSrc: Oral  PainSc:                  Veronica Krueger

## 2018-02-08 NOTE — Anesthesia Preprocedure Evaluation (Signed)
Anesthesia Evaluation  Patient identified by MRN, date of birth, ID band Patient awake    Reviewed: Allergy & Precautions, NPO status , Patient's Chart, lab work & pertinent test results  Airway Mallampati: II  TM Distance: >3 FB Neck ROM: Full    Dental no notable dental hx. (+) Missing   Pulmonary neg pulmonary ROS,    Pulmonary exam normal breath sounds clear to auscultation       Cardiovascular hypertension, Pt. on medications Normal cardiovascular exam Rhythm:Regular Rate:Normal     Neuro/Psych negative neurological ROS  negative psych ROS   GI/Hepatic negative GI ROS, Neg liver ROS,   Endo/Other  diabetes, Type 2, Oral Hypoglycemic AgentsMorbid obesity  Renal/GU negative Renal ROS  negative genitourinary   Musculoskeletal negative musculoskeletal ROS (+)   Abdominal   Peds negative pediatric ROS (+)  Hematology negative hematology ROS (+)   Anesthesia Other Findings   Reproductive/Obstetrics negative OB ROS                             Anesthesia Physical Anesthesia Plan  ASA: III  Anesthesia Plan: General   Post-op Pain Management:    Induction: Intravenous  PONV Risk Score and Plan: 4 or greater and Ondansetron, Dexamethasone, Scopolamine patch - Pre-op and Treatment may vary due to age or medical condition  Airway Management Planned: Oral ETT  Additional Equipment:   Intra-op Plan:   Post-operative Plan: Extubation in OR  Informed Consent: I have reviewed the patients History and Physical, chart, labs and discussed the procedure including the risks, benefits and alternatives for the proposed anesthesia with the patient or authorized representative who has indicated his/her understanding and acceptance.   Dental advisory given  Plan Discussed with: CRNA  Anesthesia Plan Comments:         Anesthesia Quick Evaluation

## 2018-02-08 NOTE — Anesthesia Procedure Notes (Signed)
Procedure Name: Intubation Date/Time: 02/08/2018 12:39 PM Performed by: Victoriano Lain, CRNA Pre-anesthesia Checklist: Patient identified, Emergency Drugs available, Suction available, Patient being monitored and Timeout performed Patient Re-evaluated:Patient Re-evaluated prior to induction Oxygen Delivery Method: Circle system utilized Preoxygenation: Pre-oxygenation with 100% oxygen Induction Type: IV induction Ventilation: Mask ventilation without difficulty Laryngoscope Size: Mac and 4 Grade View: Grade I Tube type: Oral Tube size: 7.5 mm Number of attempts: 2 Placement Confirmation: ETT inserted through vocal cords under direct vision,  positive ETCO2 and breath sounds checked- equal and bilateral Secured at: 21 cm Tube secured with: Tape Dental Injury: Teeth and Oropharynx as per pre-operative assessment

## 2018-02-08 NOTE — Op Note (Signed)
Preoperative diagnosis: laparoscopic sleeve gastrectomy  Postoperative diagnosis: Same   Procedure: Upper endoscopy   Surgeon: Clovis Riley, M.D.  Anesthesia: Gen.   Description of procedure: The endoscopy was placed in the mouth and into the oropharynx and under endoscopic vision it was advanced to the esophagogastric junction.  The pouch was insufflated and no bleeding or bubbles were seen.  The GEJ was identified at 42cm from the teeth. The lumen was a symmetric tubular shape without any undue narrowing, angulation or twisting. No bleeding or leaks were detected. The scope was withdrawn without difficulty.    Clovis Riley, M.D. General, Bariatric, & Minimally Invasive Surgery [facility]

## 2018-02-08 NOTE — Transfer of Care (Signed)
Immediate Anesthesia Transfer of Care Note  Patient: Veronica Krueger  Procedure(s) Performed: LAPAROSCOPIC GASTRIC SLEEVE RESECTION WITH HIATAL HERNIA REPAIR AND UPPER ENDO, ERAS Pathway (N/A Abdomen)  Patient Location: PACU  Anesthesia Type:General  Level of Consciousness: awake, alert , oriented and patient cooperative  Airway & Oxygen Therapy: Patient Spontanous Breathing and Patient connected to face mask oxygen  Post-op Assessment: Report given to RN, Post -op Vital signs reviewed and stable and Patient moving all extremities  Post vital signs: Reviewed and stable  Last Vitals:  Vitals Value Taken Time  BP 116/79 02/08/2018  2:40 PM  Temp    Pulse 67 02/08/2018  2:43 PM  Resp 19 02/08/2018  2:43 PM  SpO2 100 % 02/08/2018  2:43 PM  Vitals shown include unvalidated device data.  Last Pain:  Vitals:   02/08/18 1140  TempSrc:   PainSc: 4       Patients Stated Pain Goal: 4 (62/[id number]/76 [id number])  Complications: No apparent anesthesia complications

## 2018-02-09 ENCOUNTER — Encounter (HOSPITAL_COMMUNITY): Payer: Self-pay | Admitting: Surgery

## 2018-02-09 ENCOUNTER — Other Ambulatory Visit: Payer: Self-pay

## 2018-02-09 DIAGNOSIS — D649 Anemia, unspecified: Secondary | ICD-10-CM

## 2018-02-09 DIAGNOSIS — D631 Anemia in chronic kidney disease: Secondary | ICD-10-CM

## 2018-02-09 DIAGNOSIS — N189 Chronic kidney disease, unspecified: Secondary | ICD-10-CM

## 2018-02-09 DIAGNOSIS — D509 Iron deficiency anemia, unspecified: Secondary | ICD-10-CM

## 2018-02-09 LAB — CBC WITH DIFFERENTIAL/PLATELET
Abs Immature Granulocytes: 0.03 10*3/uL (ref 0.00–0.07)
BASOS ABS: 0 10*3/uL (ref 0.0–0.1)
Basophils Relative: 0 %
Eosinophils Absolute: 0 10*3/uL (ref 0.0–0.5)
Eosinophils Relative: 0 %
HCT: 24.7 % — ABNORMAL LOW (ref 36.0–46.0)
Hemoglobin: 6.8 g/dL — CL (ref 12.0–15.0)
Immature Granulocytes: 0 %
Lymphocytes Relative: 5 %
Lymphs Abs: 0.5 10*3/uL — ABNORMAL LOW (ref 0.7–4.0)
MCH: 22.2 pg — ABNORMAL LOW (ref 26.0–34.0)
MCHC: 27.5 g/dL — ABNORMAL LOW (ref 30.0–36.0)
MCV: 80.7 fL (ref 80.0–100.0)
Monocytes Absolute: 0.2 10*3/uL (ref 0.1–1.0)
Monocytes Relative: 2 %
NEUTROS ABS: 9.2 10*3/uL — AB (ref 1.7–7.7)
Neutrophils Relative %: 93 %
Platelets: 207 10*3/uL (ref 150–400)
RBC: 3.06 MIL/uL — AB (ref 3.87–5.11)
RDW: 16.8 % — ABNORMAL HIGH (ref 11.5–15.5)
WBC: 9.9 10*3/uL (ref 4.0–10.5)
nRBC: 0 % (ref 0.0–0.2)

## 2018-02-09 LAB — BASIC METABOLIC PANEL
Anion gap: 7 (ref 5–15)
BUN: 33 mg/dL — ABNORMAL HIGH (ref 6–20)
CO2: 19 mmol/L — ABNORMAL LOW (ref 22–32)
Calcium: 8.8 mg/dL — ABNORMAL LOW (ref 8.9–10.3)
Chloride: 111 mmol/L (ref 98–111)
Creatinine, Ser: 1.5 mg/dL — ABNORMAL HIGH (ref 0.44–1.00)
GFR calc Af Amer: 43 mL/min — ABNORMAL LOW (ref >60)
GFR calc non Af Amer: 37 mL/min — ABNORMAL LOW (ref >60)
Glucose, Bld: 217 mg/dL — ABNORMAL HIGH (ref 70–99)
Potassium: 5.7 mmol/L — ABNORMAL HIGH (ref 3.5–5.1)
Sodium: 137 mmol/L (ref 135–145)

## 2018-02-09 LAB — GLUCOSE, CAPILLARY
Glucose-Capillary: 246 mg/dL — ABNORMAL HIGH (ref 70–99)
Glucose-Capillary: 206 mg/dL — ABNORMAL HIGH (ref 70–99)
Glucose-Capillary: 161 mg/dL — ABNORMAL HIGH (ref 70–99)
Glucose-Capillary: 138 mg/dL — ABNORMAL HIGH (ref 70–99)
Glucose-Capillary: 247 mg/dL — ABNORMAL HIGH (ref 70–99)

## 2018-02-09 MED ORDER — PANTOPRAZOLE SODIUM 40 MG PO TBEC: 40.0000 mg | DELAYED_RELEASE_TABLET | Freq: Every day | ORAL | 0 refills | Status: DC

## 2018-02-09 MED ORDER — LIP MEDEX EX OINT
TOPICAL_OINTMENT | CUTANEOUS | Status: AC
  Administered 2018-02-09: 05:00:00
  Filled 2018-02-09: qty 7

## 2018-02-09 MED ORDER — PNEUMOCOCCAL VAC POLYVALENT 25 MCG/0.5ML IJ INJ: 0.5000 mL | INJECTION | INTRAMUSCULAR | Status: DC

## 2018-02-09 MED ORDER — INFLUENZA VAC SPLIT QUAD 0.5 ML IM SUSY
0.5000 mL | PREFILLED_SYRINGE | INTRAMUSCULAR | Status: AC
  Administered 2018-02-09: 0.5 mL via INTRAMUSCULAR
  Filled 2018-02-09: qty 0.5

## 2018-02-09 MED ORDER — ONDANSETRON 4 MG PO TBDP: 4.0000 mg | ORAL_TABLET | Freq: Four times a day (QID) | ORAL | 0 refills | Status: DC | PRN

## 2018-02-09 MED ORDER — ENOXAPARIN SODIUM 60 MG/0.6ML ~~LOC~~ SOLN: 60.0000 mg | Freq: Two times a day (BID) | SUBCUTANEOUS | 0 refills | Status: DC

## 2018-02-09 MED ORDER — INFLUENZA VAC SPLIT QUAD 0.5 ML IM SUSY: 0.5000 mL | PREFILLED_SYRINGE | INTRAMUSCULAR | Status: DC

## 2018-02-09 MED ORDER — OXYCODONE HCL 5 MG/5ML PO SOLN: 5.0000 mg | Freq: Four times a day (QID) | ORAL | 0 refills | Status: DC | PRN

## 2018-02-09 MED ORDER — DEXTROSE-NACL 5-0.9 % IV SOLN
INTRAVENOUS | Status: DC
  Administered 2018-02-09: 15:00:00 via INTRAVENOUS

## 2018-02-09 MED ORDER — DEXTROSE-NACL 5-0.45 % IV SOLN
INTRAVENOUS | Status: DC
  Administered 2018-02-09: 10:00:00 via INTRAVENOUS

## 2018-02-09 MED ORDER — PNEUMOCOCCAL VAC POLYVALENT 25 MCG/0.5ML IJ INJ
0.5000 mL | INJECTION | INTRAMUSCULAR | Status: AC
  Administered 2018-02-09: 0.5 mL via INTRAMUSCULAR
  Filled 2018-02-09: qty 0.5

## 2018-02-09 NOTE — Plan of Care (Signed)

## 2018-02-09 NOTE — Discharge Summary (Signed)
Physician Discharge Summary  Patient ID: Veronica Krueger MRN: [id number] DOB/AGE: Jun 24, 1955 62 y.o.  PCP: Cletis Athens, MD  Admit date: 02/08/2018 Discharge date: 02/09/2018  Admission Diagnoses:  Morbid obesity  Discharge Diagnoses:  same  Active Problems:   Morbid obesity ([place])   S/P laparoscopic sleeve gastrectomy   Anemia   Surgery:  Laparoscopic sleeve gastrectomy and single suture repair of hiatal hernia  Discharged Condition: stable  Hospital Course:   Had surgery on Tuesday and kept overnight.  She was strongly desiring discharge on Wednesday.  Discussed transfusion for anemia.  She will need further evaluation.  Incisions OK.  Will cover with Lovenox for 2 weeks  Consults: none  Significant Diagnostic Studies: none    Discharge Exam: Blood pressure (!) 134/56, pulse (!) 54, temperature 98.4 F (36.9 C), temperature source Oral, resp. rate 17, height 5\' 9"  (1.753 m), weight (!) 190.3 kg, SpO2 98 %. Staples in upper midline removed  Disposition: Discharge disposition: 01-Home or Self Care       Discharge Instructions    Ambulate hourly while awake   Complete by:  As directed    Call MD for:  difficulty breathing, headache or visual disturbances   Complete by:  As directed    Call MD for:  persistant dizziness or light-headedness   Complete by:  As directed    Call MD for:  persistant nausea and vomiting   Complete by:  As directed    Call MD for:  redness, tenderness, or signs of infection (pain, swelling, redness, odor or green/yellow discharge around incision site)   Complete by:  As directed    Call MD for:  severe uncontrolled pain   Complete by:  As directed    Call MD for:  temperature >101 F   Complete by:  As directed    Diet bariatric full liquid   Complete by:  As directed    Incentive spirometry   Complete by:  As directed    Perform hourly while awake     Allergies as of 02/09/2018      Reactions   Gabapentin Other (See Comments)    Hair loss   Penicillins Itching, Other (See Comments)   Has patient had a PCN reaction causing immediate rash, facial/tongue/throat swelling, SOB or lightheadedness with hypotension:  Has patient had a PCN reaction causing severe rash involving mucus membranes or skin necrosis:  Has patient had a PCN reaction that required hospitalization: Has patient had a PCN reaction occurring within the last 10 years: No If all of the above answers are "NO", then may proceed with Cephalosporin use.      Medication List    TAKE these medications   acetaminophen 500 MG tablet Commonly known as:  TYLENOL Take 1,000 mg by mouth every 6 (six) hours as needed for moderate pain or headache.   dorzolamide 2 % ophthalmic solution Commonly known as:  TRUSOPT Place 1 drop into the right eye 2 (two) times daily.   enoxaparin 60 MG/0.6ML injection Commonly known as:  LOVENOX Inject 0.6 mLs (60 mg total) into the skin every 12 (twelve) hours.   glipiZIDE 5 MG tablet Commonly known as:  GLUCOTROL Take 5 mg by mouth daily. Notes to patient:  Monitor Blood Sugar Frequently and keep a log for primary care physician, you may need to adjust medication dosage with rapid weight loss.     latanoprost 0.005 % ophthalmic solution Commonly known as:  XALATAN Place 1 drop into the right eye  at bedtime.   lisinopril-hydrochlorothiazide 10-12.5 MG tablet Commonly known as:  PRINZIDE,ZESTORETIC Take 1 tablet by mouth 2 (two) times daily. Notes to patient:  Monitor Blood Pressure Daily and keep a log for primary care physician.  You may need to make changes to your medications with rapid weight loss.     metoprolol tartrate 25 MG tablet Commonly known as:  LOPRESSOR Take 25 mg by mouth 2 (two) times daily. Notes to patient:  Monitor Blood Pressure Daily and keep a log for primary care physician.  You may need to make changes to your medications with rapid weight loss.     ondansetron 4 MG disintegrating  tablet Commonly known as:  ZOFRAN-ODT Take 1 tablet (4 mg total) by mouth every 6 (six) hours as needed for nausea or vomiting.   oxyCODONE 5 MG/5ML solution Commonly known as:  ROXICODONE Take 5 mLs (5 mg total) by mouth every 6 (six) hours as needed for severe pain.   pantoprazole 40 MG tablet Commonly known as:  PROTONIX Take 1 tablet (40 mg total) by mouth daily.   timolol 0.5 % ophthalmic solution Commonly known as:  TIMOPTIC Place 1 drop into both eyes 2 (two) times daily.      Follow-up Information    [facility], [place] Go on 03/09/2018.   Specialty:  General Surgery Why:  at [address] Contact information: [address] [place] [address] [phone]        Surgery, [place] .   Specialty:  General Surgery Contact information: [address] [place] [address] [phone]        Cletis Athens, MD Follow up.   Specialty:  Internal Medicine Contact information: [place] [address] [phone]           Signed: Pedro Earls 02/09/2018, 1:38 PM

## 2018-02-09 NOTE — Evaluation (Signed)
Physical Therapy One Time Evaluation Patient Details Name: Veronica Krueger MRN: [id number] DOB: November 21, 1955 Today's Date: 02/09/2018   History of Present Illness  Pt is a 62 year old female s/p Single suture posterior repair of hiatal hernia with Laparoscopic sleeve gastrectomy and upper endoscopy with hx of morbid obesity, DM, L UE weakness, lymphedema  Clinical Impression  Patient evaluated by Physical Therapy with no further acute PT needs identified. All education has been completed and the patient has no further questions. Pt performed transfer to w/c with min/guard for safety as pt reports weakness after being in bed.  Pt reports being close to baseline and eager to d/c home.  Pt reports she functions well in her home environment and has all DME needs.  See below for any follow-up Physical Therapy or equipment needs. PT is signing off. Thank you for this referral.     Follow Up Recommendations No PT follow up    Equipment Recommendations  None recommended by PT    Recommendations for Other Services       Precautions / Restrictions Precautions Precautions: Fall Restrictions Weight Bearing Restrictions: No      Mobility  Bed Mobility Overal bed mobility: Needs Assistance Bed Mobility: Supine to Sit     Supine to sit: Min guard     General bed mobility comments: increased time and effort however pt able to perform without assist  Transfers Overall transfer level: Needs assistance Equipment used: Hemi-walker Transfers: Sit to/from [facility] Sit to Stand: Min guard Stand pivot transfers: Min guard       General transfer comment: set up w/c as pt performs at home, min/guard for safety  Ambulation/Gait                Hotel manager mobility: Yes Wheelchair propulsion: Right upper extremity;Both lower extermities Wheelchair parts: Independent Distance: 21 Wheelchair Assistance  Details (indicate cue type and reason): fatigued quickly however able to use w/c safely  Modified Rankin (Stroke Patients Only)       Balance                                             Pertinent Vitals/Pain Pain Assessment: No/denies pain    Home Living Family/patient expects to be discharged to:: Private residence Living Arrangements: Alone   Type of Home: House       Home Layout: One level Home Equipment: Other (comment);Wheelchair - manual;Toilet riser;Grab bars - tub/shower;Grab bars - toilet(hemiwalker)      Prior Function Level of Independence: Independent with assistive device(s)         Comments: typically transfers with hemiwalker to manual w/c, uses manual w/c for mobility     Hand Dominance        Extremity/Trunk Assessment   Upper Extremity Assessment Upper Extremity Assessment: LUE deficits/detail LUE Deficits / Details: pt reports chronic L UE weakness    Lower Extremity Assessment Lower Extremity Assessment: Generalized weakness(lymphedema, reports generally weak today from being in bed however about to perform mobility without assist)       Communication   Communication: No difficulties  Cognition Arousal/Alertness: Awake/alert Behavior During Therapy: WFL for tasks assessed/performed Overall Cognitive Status: Within Functional Limits for tasks assessed  General Comments      Exercises     Assessment/Plan    PT Assessment Patent does not need any further PT services  PT Problem List         PT Treatment Interventions      PT Goals (Current goals can be found in the Care Plan section)  Acute Rehab PT Goals PT Goal Formulation: All assessment and education complete, DC therapy    Frequency     Barriers to discharge        Co-evaluation               AM-PAC PT "6 Clicks" Mobility  Outcome Measure Help needed turning from your back to your  side while in a flat bed without using bedrails?: A Little Help needed moving from lying on your back to sitting on the side of a flat bed without using bedrails?: A Little Help needed moving to and from a bed to a chair (including a wheelchair)?: A Little Help needed standing up from a chair using your arms (e.g., wheelchair or bedside chair)?: A Little Help needed to walk in hospital room?: Total Help needed climbing 3-5 steps with a railing? : Total 6 Click Score: 14    End of Session   Activity Tolerance: Patient tolerated treatment well Patient left: in chair;with call bell/phone within reach Nurse Communication: Mobility status(NT and RN aware pt left up in w/c to eat, hemiwalker in room )      Time: 2725-3664 PT Time Calculation (min) (ACUTE ONLY): 24 min   Charges:   PT Evaluation $PT Eval Low Complexity: [place], PT, DPT Acute Rehabilitation Services Office: [phone] Pager: [phone]   Trena Platt 02/09/2018, 12:25 PM

## 2018-02-09 NOTE — Progress Notes (Signed)
Patient's Hgb-6.8 this morning. Notified on call Dr D. Newman. Md said let Dr Hassell Done evaluate this patient in the morning. Pt's pre-operative Hgb 7.7.  Will let 1st shift nurse know . Patient is stable. No any acute distress.

## 2018-02-09 NOTE — Progress Notes (Signed)
OT Cancellation Note  Patient Details Name: Veronica Krueger MRN: [id number] DOB: May 22, 1955   Cancelled Treatment:    Reason Eval/Treat Not Completed: OT screened, no needs identified, will sign off. Pt feels she is fine with adls. She doesn't wear socks and has a reacher at home, if needed.  Apt is handicapped accessible. She doesn't feel sx will limit her at all.  Calil Amor 02/09/2018, 2:06 PM  Lesle Chris, OTR/L Acute Rehabilitation Services [phone] [facility] pager [phone] office 02/09/2018

## 2018-02-09 NOTE — Progress Notes (Signed)
Pt alert and oriented, tolerating liquids. D/c instructions given by Parks Neptune, RN.  Gave her the Lovenox teaching kit. Pt d/cd to home.

## 2018-02-09 NOTE — Care Management (Signed)
CM consult for benefits check for Enoxaparin. MD [facility]. This CM contacted [facility] for copay. Copay for pt would be $3.40. This CM informed staff nurse of cost.  Marney Doctor RN,BSN [phone]

## 2018-02-09 NOTE — Progress Notes (Signed)
Patient alert and oriented, pain is controlled. Patient is tolerating fluids, advanced to protein shake today, patient is tolerating well.  Reviewed Gastric sleeve discharge instructions with patient and patient is able to articulate understanding.  Provided information on BELT program, Support Group and [facility] outpatient pharmacy. All questions answered, will continue to monitor.  Total fluid intake 720 Per dehydration protocol call back one week post op 

## 2018-02-10 ENCOUNTER — Telehealth: Payer: Self-pay | Admitting: Dietician

## 2018-02-10 ENCOUNTER — Telehealth (HOSPITAL_COMMUNITY): Payer: Self-pay

## 2018-02-10 NOTE — Telephone Encounter (Signed)
Called patient in response to message from RN that patient needs some bariatric vitamin samples. Informed patient that a packet of samples would be left for her at the [facility] front desk. She will have a friend pick up the packet.

## 2018-02-14 ENCOUNTER — Telehealth (HOSPITAL_COMMUNITY): Payer: Self-pay

## 2018-02-14 NOTE — Telephone Encounter (Signed)
Left message to discuss post bariatric surgery follow up questions.  See below:   1.  Tell me about your pain and pain management?denies  2.  Let's talk about fluid intake.  How much total fluid are you taking in?53-60 ounces of fluid  3.  How much protein have you taken in the last 2 days?21 grams discussed increasing the amount  4.  Have you had nausea?  Tell me about when have experienced nausea and what you did to help?nausea x 1 took zofran and helped  5.  Has the frequency or color changed with your urine?clear urine denies headache or dizziness  6.  Tell me what your incisions look like?no problems  7.  Have you been passing gas? BM?pasing gas had bm everyday  8.  If a problem or question were to arise who would you call?  Do you know contact numbers for [place], [facility], and [facility]?aware of how to contact all services  9.  How has the walking going?wheelchair bound, does isometric exercises and wheels around her home an extra time each hour  10.  How are your vitamins and calcium going?  How are you taking them?received samples from [facility] and taking appropriately.  She has ordered the same vitamin from online to ship to her home  Follow up with PCP on Jan 3rd.  Lovenox shots without difficulty no questions or concerns with this medication.  Aware of when to call MD related to medication

## 2018-02-21 ENCOUNTER — Encounter: Payer: Medicare HMO | Admitting: Dietician

## 2018-02-21 ENCOUNTER — Encounter: Payer: Self-pay | Admitting: Dietician

## 2018-02-21 VITALS — Ht 69.0 in | Wt >= 6400 oz

## 2018-02-21 DIAGNOSIS — Z713 Dietary counseling and surveillance: Secondary | ICD-10-CM | POA: Diagnosis not present

## 2018-02-21 DIAGNOSIS — Z6841 Body Mass Index (BMI) 40.0 and over, adult: Principal | ICD-10-CM

## 2018-02-21 DIAGNOSIS — R69 Illness, unspecified: Secondary | ICD-10-CM | POA: Diagnosis not present

## 2018-02-21 NOTE — Patient Instructions (Signed)
   Add 1 scoop of protein powder into 8oz water, but drink this 2 or more times a day.   Begin eating 1-2oz of lean meat, lowfat cheese, or eggs 2-3 times a day. Eat slowly and chew thoroughly, and avoid fluids during and close to mealtimes (15-30-30 rule)  Gradually reduce the protein drinks as intake of solid food.  Keep up fluid intake, great job so far!

## 2018-02-21 NOTE — Progress Notes (Signed)
Follow-up visit:  2 Weeks Post-Operative Sleeve Gastrectomy Surgery  Medical Nutrition Therapy:  Appt start time: 1330 end time:  1443.  Primary concerns today: Post-operative Bariatric Surgery Nutrition Management.  Preferred Learning Style:   Auditory  Visual  Hands on  No preference indicated   Learning Readiness:   Not ready  Contemplating  Ready  Change in progress  Weight: 410lbs Height: 5'9" Weight at previous [facility] visit: 431.1lbs    Progress: Weight loss of about 21.1lbs since previous [facility] visit. Patient denies any adverse issues during or since surgery. She does report unpleasant texture of protein drink, but she has been using 2.5 scoops of protein powder in [place] water (was told that she would need to take in 2.5 scoops daily to meet protein goal). Patient is also having trouble with unpleasant aftertaste from chewable vitamins.   Dietary recall: Breakfast: [facility] protein powder vanilla, 2.5 scoops, mixed with 8oz water(sips throughout the day) Snack: sips fluids every 15 minutes -- water, [place]'s light, sugar free Nature's Twist  Lunch: light cream of mushroom soup or chicken broth Snack: fluids  Dinner: sugar free jello Snack: fluids  Fluid intake: 64oz or more daily Estimated total protein intake: 65g (1 scoop protein powder = 26g)   Medications: acetaminophen, enoxaparin injection (finishes tomorrow per patient), glipiZIDE, lisinopril-hydrochlorothiazide, metoprolol tartrate, pantoprazole, ophthalmic solutions: dorzolamide, latanoprost, timolol  Supplementation: bariatric vitamins 2x daily + calcium 500mg  3x daily  Using straws: no Drinking while eating: n/a Hair loss: no Carbonated beverages: no N/V/D/C: no nausea, vomiting, occasional soft stool Dumping syndrome: no   Recent physical activity:  Wheelchair exercises daily  Progress Towards Goal(s):  In progress.  Handouts given during visit include:  Phase 3 and 4 bariatric diet  handouts   Nutritional Diagnosis:  [place]-3.3 Overweight/obesity As related to history of excess calories and inactivity, lymphedema.  As evidenced by patient with current BMI of 60.55, following bariatric diet guidelines after sleeve gastrectomy surgery.    Intervention:   Instructed patient on reintroduction of solid protein foods into diet. Advised waiting to reintroduce non-starchy vegetables until able to eat several ounces of solid proteins daily.  Instructed patient to use 1 scoop of protein powder per 8oz fluid, but to consume 16-20oz of protein drinks daily. To gradually decrease when able to consume enough solid protein.  Provided samples of soft-chew mutivitamins to try in place of chewable tablets-- lot#[id number], expiration: 04/2019  Teaching Method Utilized:  Visual Auditory Hands on  Barriers to learning/adherence to lifestyle change: none  Demonstrated degree of understanding via:  Teach Back   Monitoring/Evaluation:  Dietary intake, exercise, and body weight. Follow up in 6-7 weeks for 2 month post-op visit.

## 2018-02-25 DIAGNOSIS — R51 Headache: Secondary | ICD-10-CM | POA: Diagnosis not present

## 2018-02-25 DIAGNOSIS — I10 Essential (primary) hypertension: Secondary | ICD-10-CM | POA: Diagnosis not present

## 2018-02-25 DIAGNOSIS — E7849 Other hyperlipidemia: Secondary | ICD-10-CM | POA: Diagnosis not present

## 2018-02-25 DIAGNOSIS — Q820 Hereditary lymphedema: Secondary | ICD-10-CM | POA: Diagnosis not present

## 2018-02-25 DIAGNOSIS — E034 Atrophy of thyroid (acquired): Secondary | ICD-10-CM | POA: Diagnosis not present

## 2018-02-25 DIAGNOSIS — E119 Type 2 diabetes mellitus without complications: Secondary | ICD-10-CM | POA: Diagnosis not present

## 2018-03-09 DIAGNOSIS — Z1331 Encounter for screening for depression: Secondary | ICD-10-CM | POA: Diagnosis not present

## 2018-03-09 DIAGNOSIS — I10 Essential (primary) hypertension: Secondary | ICD-10-CM | POA: Diagnosis not present

## 2018-03-09 DIAGNOSIS — Z13220 Encounter for screening for lipoid disorders: Secondary | ICD-10-CM | POA: Diagnosis not present

## 2018-03-09 DIAGNOSIS — R5383 Other fatigue: Secondary | ICD-10-CM | POA: Diagnosis not present

## 2018-03-09 DIAGNOSIS — Z13818 Encounter for screening for other digestive system disorders: Secondary | ICD-10-CM | POA: Diagnosis not present

## 2018-03-09 DIAGNOSIS — Z9884 Bariatric surgery status: Secondary | ICD-10-CM | POA: Diagnosis not present

## 2018-03-09 DIAGNOSIS — G43909 Migraine, unspecified, not intractable, without status migrainosus: Secondary | ICD-10-CM | POA: Diagnosis not present

## 2018-03-09 DIAGNOSIS — E118 Type 2 diabetes mellitus with unspecified complications: Secondary | ICD-10-CM | POA: Diagnosis not present

## 2018-03-09 DIAGNOSIS — M25579 Pain in unspecified ankle and joints of unspecified foot: Secondary | ICD-10-CM | POA: Diagnosis not present

## 2018-03-14 DIAGNOSIS — I10 Essential (primary) hypertension: Secondary | ICD-10-CM | POA: Diagnosis not present

## 2018-03-14 DIAGNOSIS — Z13818 Encounter for screening for other digestive system disorders: Secondary | ICD-10-CM | POA: Diagnosis not present

## 2018-03-14 DIAGNOSIS — M25579 Pain in unspecified ankle and joints of unspecified foot: Secondary | ICD-10-CM | POA: Diagnosis not present

## 2018-03-14 DIAGNOSIS — R5383 Other fatigue: Secondary | ICD-10-CM | POA: Diagnosis not present

## 2018-03-14 DIAGNOSIS — E118 Type 2 diabetes mellitus with unspecified complications: Secondary | ICD-10-CM | POA: Diagnosis not present

## 2018-03-14 DIAGNOSIS — Z13220 Encounter for screening for lipoid disorders: Secondary | ICD-10-CM | POA: Diagnosis not present

## 2018-03-14 DIAGNOSIS — G43909 Migraine, unspecified, not intractable, without status migrainosus: Secondary | ICD-10-CM | POA: Diagnosis not present

## 2018-03-17 DIAGNOSIS — M109 Gout, unspecified: Secondary | ICD-10-CM | POA: Diagnosis not present

## 2018-03-17 DIAGNOSIS — D509 Iron deficiency anemia, unspecified: Secondary | ICD-10-CM | POA: Diagnosis not present

## 2018-03-17 DIAGNOSIS — E119 Type 2 diabetes mellitus without complications: Secondary | ICD-10-CM | POA: Diagnosis not present

## 2018-03-17 DIAGNOSIS — E782 Mixed hyperlipidemia: Secondary | ICD-10-CM | POA: Diagnosis not present

## 2018-03-17 DIAGNOSIS — I10 Essential (primary) hypertension: Secondary | ICD-10-CM | POA: Diagnosis not present

## 2018-03-30 ENCOUNTER — Encounter: Payer: Medicare HMO | Attending: Surgery | Admitting: Dietician

## 2018-03-30 ENCOUNTER — Encounter: Payer: Self-pay | Admitting: Dietician

## 2018-03-30 VITALS — Ht 69.0 in | Wt >= 6400 oz

## 2018-03-30 DIAGNOSIS — Z713 Dietary counseling and surveillance: Secondary | ICD-10-CM | POA: Diagnosis not present

## 2018-03-30 DIAGNOSIS — Z6841 Body Mass Index (BMI) 40.0 and over, adult: Secondary | ICD-10-CM | POA: Insufficient documentation

## 2018-03-30 NOTE — Patient Instructions (Signed)
   If you can't tolerate the bariatric multivitamins, use a chewable multivitamin for adults, 2x a day, + add a B-complex vitamin for B12 and B1 (thiamin). Continue taking calcium 3x a day, separate from other vitamins.   Resume drinking one protein drink daily to make sure to reach the goal of 60grams protein. Add unflavored protein powder to lowfat cream soup or yogurt or other foods.   When you are able to take in at least 60 grams of protein daily, you can add 1-2 small portions of fruits (no sugar added).

## 2018-03-30 NOTE — Progress Notes (Signed)
Follow-up visit:  8 Weeks Post-Operative Sleeve Gastrectomy Surgery  Medical Nutrition Therapy:  Appt start time: 1330 end time:  1400.  Primary concerns today: Post-operative Bariatric Surgery Nutrition Management.  Preferred Learning Style:   Auditory  Visual  Hands on  Learning Readiness:   Change in progress  Weight: 413.2lbs Height: 5'9" Weight at previous [facility] visit: 410.0lbs  Progress: Weight today shows an increase of 3lbs--maybe due to fluid retention or inaccurate measurement? Ms. Ledee reports eating some solid proteins and vegetables-- tried sauerkraut, cabbage, spinach, green beans and tolerated well. Has stopped drinking protein drinks. She reports feeling nauseated when trying to take bariatric multivitamins due to taste, odor, and texture and states she will not be able to continue taking them.   Dietary recall: Breakfast: none, sleeps late Snack: none  Lunch: meat, veg sometimes later -- difficult to eat both at the same time Snack: cream soup  Dinner: about 1oz meat ground [place], chicken, +veg.  Snack: 1oz meat or egg or cheese Beverages: Sips fluids every 15 minutes (separate from mealtimes by 30 minutes)-- mostly water, sometimes with sugar free flavoring added   Fluid intake: 48-64oz Estimated total protein intake: 21-40g  Medications: acetaminophen prn, allopurinol, dorzolamide eye drops, glipiZIDE, latanaprost eye drops, lisinopril-hydrochlorothiazide, metoprolol, pantoprazole, timolol eye drops  Supplementation: (currently) bariatric multivitamin 2x daily, calcium 3x daily, ferrous sulfate, vitamin D  Using straws: no, never Drinking while eating: no Hair loss: no Carbonated beverages: no N/V/D/C: no Dumping syndrome: no   Recent physical activity:  Wheelchair exercises daily  Progress Towards Goal(s):  In progress.  Handouts given during visit include:  Phase V and Phase VI bariatric diet handouts  Goals and  Instructions   Nutritional Diagnosis:  [place]-3.3 Overweight/obesity As related to history of excess calories and inactivity, lymphedema.  As evidenced by patient with current BMI of 61, following bariatric diet guidelines after sleeve gastrectomy surgery.    Intervention:    Patient seems to be short of protein goal, with limited intake of solid proteins.  Fluid intake might be low at times.   Set goals for increasing protein intake to goal, and advancing diet to include other foods only when able to reach protein goal consistently.   Encouraged patient to come for additional weight checks at this office when able (relies on friend for transportation).   Teaching Method Utilized:  Visual Auditory Hands on  Barriers to learning/adherence to lifestyle change: none  Demonstrated degree of understanding via:  Teach Back   Monitoring/Evaluation:  Dietary intake, exercise, and body weight. Follow up in 4 months for 6 month post-op visit, or sooner if needed.

## 2018-04-09 DIAGNOSIS — R69 Illness, unspecified: Secondary | ICD-10-CM | POA: Diagnosis not present

## 2018-04-26 DIAGNOSIS — H4061X3 Glaucoma secondary to drugs, right eye, severe stage: Secondary | ICD-10-CM | POA: Diagnosis not present

## 2018-04-26 DIAGNOSIS — H43822 Vitreomacular adhesion, left eye: Secondary | ICD-10-CM | POA: Diagnosis not present

## 2018-04-26 DIAGNOSIS — T380X5A Adverse effect of glucocorticoids and synthetic analogues, initial encounter: Secondary | ICD-10-CM | POA: Diagnosis not present

## 2018-04-26 DIAGNOSIS — H35372 Puckering of macula, left eye: Secondary | ICD-10-CM | POA: Diagnosis not present

## 2018-04-26 DIAGNOSIS — E113313 Type 2 diabetes mellitus with moderate nonproliferative diabetic retinopathy with macular edema, bilateral: Secondary | ICD-10-CM | POA: Diagnosis not present

## 2018-05-27 DIAGNOSIS — R69 Illness, unspecified: Secondary | ICD-10-CM | POA: Diagnosis not present

## 2018-06-23 ENCOUNTER — Other Ambulatory Visit: Payer: Self-pay | Admitting: Internal Medicine

## 2018-06-23 ENCOUNTER — Other Ambulatory Visit (HOSPITAL_COMMUNITY): Payer: Self-pay | Admitting: Internal Medicine

## 2018-06-23 DIAGNOSIS — E559 Vitamin D deficiency, unspecified: Secondary | ICD-10-CM | POA: Diagnosis not present

## 2018-06-23 DIAGNOSIS — E782 Mixed hyperlipidemia: Secondary | ICD-10-CM | POA: Diagnosis not present

## 2018-06-23 DIAGNOSIS — I8310 Varicose veins of unspecified lower extremity with inflammation: Secondary | ICD-10-CM | POA: Diagnosis not present

## 2018-06-23 DIAGNOSIS — E669 Obesity, unspecified: Secondary | ICD-10-CM | POA: Diagnosis not present

## 2018-06-23 DIAGNOSIS — E049 Nontoxic goiter, unspecified: Secondary | ICD-10-CM

## 2018-06-23 DIAGNOSIS — I10 Essential (primary) hypertension: Secondary | ICD-10-CM | POA: Diagnosis not present

## 2018-06-23 DIAGNOSIS — E040 Nontoxic diffuse goiter: Secondary | ICD-10-CM | POA: Diagnosis not present

## 2018-06-23 DIAGNOSIS — E1121 Type 2 diabetes mellitus with diabetic nephropathy: Secondary | ICD-10-CM | POA: Diagnosis not present

## 2018-06-23 DIAGNOSIS — M109 Gout, unspecified: Secondary | ICD-10-CM | POA: Diagnosis not present

## 2018-07-01 ENCOUNTER — Ambulatory Visit
Admission: RE | Admit: 2018-07-01 | Discharge: 2018-07-01 | Disposition: A | Discharge status: Home / Self Care | Payer: Medicare HMO | Source: Ambulatory Visit | Attending: Internal Medicine | Admitting: Internal Medicine

## 2018-07-01 ENCOUNTER — Other Ambulatory Visit: Payer: Self-pay

## 2018-07-01 DIAGNOSIS — E049 Nontoxic goiter, unspecified: Secondary | ICD-10-CM | POA: Insufficient documentation

## 2018-07-04 DIAGNOSIS — G43909 Migraine, unspecified, not intractable, without status migrainosus: Secondary | ICD-10-CM | POA: Diagnosis not present

## 2018-07-04 DIAGNOSIS — I10 Essential (primary) hypertension: Secondary | ICD-10-CM | POA: Diagnosis not present

## 2018-07-04 DIAGNOSIS — R69 Illness, unspecified: Secondary | ICD-10-CM | POA: Diagnosis not present

## 2018-07-04 DIAGNOSIS — E118 Type 2 diabetes mellitus with unspecified complications: Secondary | ICD-10-CM | POA: Diagnosis not present

## 2018-07-04 DIAGNOSIS — M109 Gout, unspecified: Secondary | ICD-10-CM | POA: Diagnosis not present

## 2018-07-04 DIAGNOSIS — Z9884 Bariatric surgery status: Secondary | ICD-10-CM | POA: Diagnosis not present

## 2018-07-11 DIAGNOSIS — E010 Iodine-deficiency related diffuse (endemic) goiter: Secondary | ICD-10-CM | POA: Diagnosis not present

## 2018-07-11 DIAGNOSIS — E042 Nontoxic multinodular goiter: Secondary | ICD-10-CM | POA: Diagnosis not present

## 2018-07-20 DIAGNOSIS — E042 Nontoxic multinodular goiter: Secondary | ICD-10-CM | POA: Diagnosis not present

## 2018-07-21 DIAGNOSIS — E042 Nontoxic multinodular goiter: Secondary | ICD-10-CM | POA: Diagnosis not present

## 2018-07-23 DIAGNOSIS — R69 Illness, unspecified: Secondary | ICD-10-CM | POA: Diagnosis not present

## 2018-08-05 DIAGNOSIS — G43019 Migraine without aura, intractable, without status migrainosus: Secondary | ICD-10-CM | POA: Diagnosis not present

## 2018-08-05 DIAGNOSIS — M5481 Occipital neuralgia: Secondary | ICD-10-CM | POA: Diagnosis not present

## 2018-08-10 ENCOUNTER — Ambulatory Visit: Payer: Medicare HMO

## 2018-08-10 ENCOUNTER — Inpatient Hospital Stay: Payer: Medicare HMO | Attending: Internal Medicine | Admitting: Internal Medicine

## 2018-08-10 ENCOUNTER — Ambulatory Visit: Payer: Self-pay | Admitting: Dietician

## 2018-08-10 ENCOUNTER — Other Ambulatory Visit: Payer: Self-pay

## 2018-08-10 ENCOUNTER — Telehealth: Payer: Self-pay | Admitting: Dietician

## 2018-08-10 ENCOUNTER — Encounter: Payer: Self-pay | Admitting: Internal Medicine

## 2018-08-10 DIAGNOSIS — E049 Nontoxic goiter, unspecified: Secondary | ICD-10-CM | POA: Diagnosis not present

## 2018-08-10 DIAGNOSIS — D649 Anemia, unspecified: Secondary | ICD-10-CM | POA: Diagnosis not present

## 2018-08-10 DIAGNOSIS — D509 Iron deficiency anemia, unspecified: Secondary | ICD-10-CM | POA: Diagnosis not present

## 2018-08-10 DIAGNOSIS — Z79899 Other long term (current) drug therapy: Secondary | ICD-10-CM | POA: Diagnosis not present

## 2018-08-10 DIAGNOSIS — Z9884 Bariatric surgery status: Secondary | ICD-10-CM

## 2018-08-10 DIAGNOSIS — E1122 Type 2 diabetes mellitus with diabetic chronic kidney disease: Secondary | ICD-10-CM | POA: Diagnosis not present

## 2018-08-10 DIAGNOSIS — I10 Essential (primary) hypertension: Secondary | ICD-10-CM | POA: Diagnosis not present

## 2018-08-10 DIAGNOSIS — N182 Chronic kidney disease, stage 2 (mild): Secondary | ICD-10-CM

## 2018-08-10 DIAGNOSIS — E119 Type 2 diabetes mellitus without complications: Secondary | ICD-10-CM | POA: Diagnosis not present

## 2018-08-10 DIAGNOSIS — Z7984 Long term (current) use of oral hypoglycemic drugs: Secondary | ICD-10-CM | POA: Diagnosis not present

## 2018-08-10 DIAGNOSIS — I890 Lymphedema, not elsewhere classified: Secondary | ICD-10-CM | POA: Diagnosis not present

## 2018-08-10 LAB — COMPREHENSIVE METABOLIC PANEL
ALT: 13 U/L (ref 0–44)
AST: 10 U/L — ABNORMAL LOW (ref 15–41)
Albumin: 4 g/dL (ref 3.5–5.0)
Alkaline Phosphatase: 57 U/L (ref 38–126)
Anion gap: 7 (ref 5–15)
BUN: 41 mg/dL — ABNORMAL HIGH (ref 8–23)
CO2: 20 mmol/L — ABNORMAL LOW (ref 22–32)
Calcium: 9.4 mg/dL (ref 8.9–10.3)
Chloride: 111 mmol/L (ref 98–111)
Creatinine, Ser: 1.77 mg/dL — ABNORMAL HIGH (ref 0.44–1.00)
GFR calc Af Amer: 35 mL/min — ABNORMAL LOW (ref >60)
GFR calc non Af Amer: 30 mL/min — ABNORMAL LOW (ref >60)
Glucose, Bld: 157 mg/dL — ABNORMAL HIGH (ref 70–99)
Potassium: 5.7 mmol/L — ABNORMAL HIGH (ref 3.5–5.1)
Sodium: 138 mmol/L (ref 135–145)
Total Bilirubin: 0.4 mg/dL (ref 0.3–1.2)
Total Protein: 8.8 g/dL — ABNORMAL HIGH (ref 6.5–8.1)

## 2018-08-10 LAB — CBC WITH DIFFERENTIAL/PLATELET
Abs Immature Granulocytes: 0.04 10*3/uL (ref 0.00–0.07)
Basophils Absolute: 0 10*3/uL (ref 0.0–0.1)
Basophils Relative: 0 %
Eosinophils Absolute: 0 10*3/uL (ref 0.0–0.5)
Eosinophils Relative: 0 %
HCT: 30.1 % — ABNORMAL LOW (ref 36.0–46.0)
Hemoglobin: 8.9 g/dL — ABNORMAL LOW (ref 12.0–15.0)
Immature Granulocytes: 0 %
Lymphocytes Relative: 8 %
Lymphs Abs: 0.9 10*3/uL (ref 0.7–4.0)
MCH: 23.8 pg — ABNORMAL LOW (ref 26.0–34.0)
MCHC: 29.6 g/dL — ABNORMAL LOW (ref 30.0–36.0)
MCV: 80.5 fL (ref 80.0–100.0)
Monocytes Absolute: 0.2 10*3/uL (ref 0.1–1.0)
Monocytes Relative: 2 %
Neutro Abs: 9.5 10*3/uL — ABNORMAL HIGH (ref 1.7–7.7)
Neutrophils Relative %: 90 %
Platelets: 227 10*3/uL (ref 150–400)
RBC: 3.74 MIL/uL — ABNORMAL LOW (ref 3.87–5.11)
RDW: 18.3 % — ABNORMAL HIGH (ref 11.5–15.5)
Smear Review: ADEQUATE
WBC: 10.7 10*3/uL — ABNORMAL HIGH (ref 4.0–10.5)
nRBC: 0 % (ref 0.0–0.2)

## 2018-08-10 LAB — IRON AND TIBC
Iron: 38 ug/dL (ref 28–170)
Saturation Ratios: 14 % (ref 10.4–31.8)
TIBC: 274 ug/dL (ref 250–450)
UIBC: 236 ug/dL

## 2018-08-10 LAB — RETICULOCYTES
Immature Retic Fract: 17.2 % — ABNORMAL HIGH (ref 2.3–15.9)
RBC.: 3.74 MIL/uL — ABNORMAL LOW (ref 3.87–5.11)
Retic Count, Absolute: 49.7 10*3/uL (ref 19.0–186.0)
Retic Ct Pct: 1.3 % (ref 0.4–3.1)

## 2018-08-10 LAB — URINALYSIS, COMPLETE (UACMP) WITH MICROSCOPIC
Bacteria, UA: NONE SEEN
Bilirubin Urine: NEGATIVE
Glucose, UA: NEGATIVE mg/dL
Hgb urine dipstick: NEGATIVE
Ketones, ur: NEGATIVE mg/dL
Leukocytes,Ua: NEGATIVE
Nitrite: NEGATIVE
Protein, ur: NEGATIVE mg/dL
Specific Gravity, Urine: 1.009 (ref 1.005–1.030)
pH: 6 (ref 5.0–8.0)

## 2018-08-10 LAB — FERRITIN: Ferritin: 128 ng/mL (ref 11–307)

## 2018-08-10 LAB — TECHNOLOGIST SMEAR REVIEW

## 2018-08-10 NOTE — Progress Notes (Signed)
[facility] NOTE  Patient Care Team: Mechele Claude, FNP as PCP - General (Family Medicine)  CHIEF COMPLAINTS/PURPOSE OF CONSULTATION:    HEMATOLOGY HISTORY  #Microcytic anemia[7-8]; chronic as per patient [even prior to gastric sleeve]; MCV 76.  B12-elevated.  # Gastric sleeve [Dec 2019] EGD-/ colonoscopy ;capsule-none;  Bone marrow Biopsy-none  #Morbid obesity/lymphedema CKD-II/diabetes [PCP A1c 6.8]; goiter-status post biopsy as per patient.   HISTORY OF PRESENTING ILLNESS:  Veronica Krueger 63 y.o.  female has been referred to [place] for further evaluation/work-up for anemia.  Patient said that she had history of chronic anemia when she had been on iron pills in the past.  She never had any work-up with hematology in the past.  She stated that she had a gastric sleeve December of last year.  Noted to have her anemia getting worse since then.   Patient denies any blood in stools or black or stools.  Denies having any constipation or diarrhea.  No vaginal bleeding.  No blood in urine.  No difficulty swallowing.  Patient has been on iron pills for the last 3 months-only with slight improvement of anemia.  Is tired.   Review of Systems  Constitutional: Positive for malaise/fatigue. Negative for chills, diaphoresis, fever and weight loss.  HENT: Negative for nosebleeds and sore throat.   Eyes: Negative for double vision.  Respiratory: Negative for cough, hemoptysis, sputum production, shortness of breath and wheezing.   Cardiovascular: Negative for chest pain, palpitations, orthopnea and leg swelling.  Gastrointestinal: Negative for abdominal pain, blood in stool, constipation, diarrhea, heartburn, melena, nausea and vomiting.  Genitourinary: Negative for dysuria, frequency and urgency.  Musculoskeletal: Negative for back pain and joint pain.  Skin: Negative.  Negative for itching and rash.  Neurological: Negative for dizziness, tingling, focal weakness,  weakness and headaches.  Endo/Heme/Allergies: Does not bruise/bleed easily.  Psychiatric/Behavioral: Negative for depression. The patient is not nervous/anxious and does not have insomnia.     MEDICAL HISTORY:  Past Medical History:  Diagnosis Date  . Anemia   . Diabetes mellitus without complication ([place])    type 2   . Hypertension   . Left hand weakness    related to previous stomach abscess surgery   . Lymph edema    legs   . Migraine     SURGICAL HISTORY: Past Surgical History:  Procedure Laterality Date  . bilateral cataract surgery     . history of stomach surgery      due to left sided abscess of stomach - 2010   . LAPAROSCOPIC GASTRIC SLEEVE RESECTION N/A 02/08/2018   Procedure: LAPAROSCOPIC GASTRIC SLEEVE RESECTION WITH HIATAL HERNIA REPAIR AND UPPER ENDO, ERAS Pathway;  Surgeon: Johnathan Hausen, MD;  Location: [facility];  Service: General;  Laterality: N/A;    SOCIAL HISTORY: Social History   Socioeconomic History  . Marital status: Single    Spouse name: Not on file  . Number of children: Not on file  . Years of education: Not on file  . Highest education level: Not on file  Occupational History  . Not on file  Social Needs  . Financial resource strain: Not on file  . Food insecurity    Worry: Not on file    Inability: Not on file  . Transportation needs    Medical: Not on file    Non-medical: Not on file  Tobacco Use  . Smoking status: Never Smoker  . Smokeless tobacco: Never Used  Substance and Sexual Activity  .  Alcohol use: Never    Frequency: Never  . Drug use: Never  . Sexual activity: Not on file  Lifestyle  . Physical activity    Days per week: Not on file    Minutes per session: Not on file  . Stress: Not on file  Relationships  . Social Herbalist on phone: Not on file    Gets together: Not on file    Attends religious service: Not on file    Active member of club or organization: Not on file    Attends meetings of clubs  or organizations: Not on file    Relationship status: Not on file  . Intimate partner violence    Fear of current or ex partner: Not on file    Emotionally abused: Not on file    Physically abused: Not on file    Forced sexual activity: Not on file  Other Topics Concern  . Not on file  Social History Narrative   Lives in [place]; with self; used in health care field/disbaled; no smoking or alcohol.    FAMILY HISTORY: Family History  Problem Relation Age of Onset  . Diabetes Sister     ALLERGIES:  is allergic to gabapentin and penicillins.  MEDICATIONS:  Current Outpatient Medications  Medication Sig Dispense Refill  . acetaminophen (TYLENOL) 500 MG tablet Take 1,000 mg by mouth every 6 (six) hours as needed for moderate pain or headache.     . allopurinol (ZYLOPRIM) 100 MG tablet     . dorzolamide (TRUSOPT) 2 % ophthalmic solution Place 1 drop into the right eye 2 (two) times daily.   99  . ferrous sulfate 325 (65 FE) MG EC tablet TAKE ONE TABLET IN THE MORNING DAILY    . glipiZIDE (GLUCOTROL) 5 MG tablet Take 5 mg by mouth daily.  3  . latanoprost (XALATAN) 0.005 % ophthalmic solution Place 1 drop into the right eye at bedtime.     Marland Kitchen lisinopril-hydrochlorothiazide (PRINZIDE,ZESTORETIC) 10-12.5 MG tablet Take 1 tablet by mouth 2 (two) times daily.  3  . methylPREDNISolone (MEDROL DOSEPAK) 4 MG TBPK tablet TAKE 6 TABLETS ON DAY 1 AS DIRECTED ON PACKAGE AND DECREASE BY 1 TAB EACH DAY FOR A TOTAL OF 6 DAYS    . metoprolol tartrate (LOPRESSOR) 25 MG tablet Take 25 mg by mouth 2 (two) times daily.    . pantoprazole (PROTONIX) 40 MG tablet Take 1 tablet (40 mg total) by mouth daily. 90 tablet 0  . timolol (TIMOPTIC) 0.5 % ophthalmic solution Place 1 drop into both eyes 2 (two) times daily.   4  . Vitamin D, Ergocalciferol, (DRISDOL) 1.25 MG (50000 UT) CAPS capsule Take 50,000 Units by mouth once a week.     No current facility-administered medications for this visit.        PHYSICAL EXAMINATION:   Vitals:   08/10/18 1515  BP: (!) 153/83  Pulse: 70  Temp: (!) 96.1 F (35.6 C)   Filed Weights    Physical Exam  Constitutional: She is oriented to person, place, and time and well-developed, well-nourished, and in no distress.  Morbidly obese.  She is in a wheelchair.  HENT:  Head: Normocephalic and atraumatic.  Mouth/Throat: Oropharynx is clear and moist. No oropharyngeal exudate.  Eyes: Pupils are equal, round, and reactive to light.  Neck: Normal range of motion. Neck supple. Thyromegaly present.  Cardiovascular: Normal rate and regular rhythm.  Pulmonary/Chest: Breath sounds normal. No respiratory distress. She has no  wheezes.  Abdominal: Soft. Bowel sounds are normal. She exhibits no distension and no mass. There is no abdominal tenderness. There is no rebound and no guarding.  Musculoskeletal: Normal range of motion.        General: Edema (Chronic lymphedema.) present. No tenderness.  Neurological: She is alert and oriented to person, place, and time.  Skin: Skin is warm.  Psychiatric: Affect normal.    LABORATORY DATA:  I have reviewed the data as listed Lab Results  Component Value Date   WBC 9.9 02/09/2018   HGB 6.8 (LL) 02/09/2018   HCT 24.7 (L) 02/09/2018   MCV 80.7 02/09/2018   PLT 207 02/09/2018   Recent Labs    02/07/18 1220 02/08/18 1854 02/09/18 0924  NA 140  --  137  K 5.3*  --  5.7*  CL 113*  --  111  CO2 22  --  19*  GLUCOSE 104*  --  217*  BUN 41*  --  33*  CREATININE 1.42* 1.39* 1.50*  CALCIUM 9.1  --  8.8*  GFRNONAA 40* 41* 37*  GFRAA 46* 48* 43*  PROT 8.4*  --   --   ALBUMIN 3.6  --   --   AST 9*  --   --   ALT 12  --   --   ALKPHOS 67  --   --   BILITOT 0.6  --   --      No results found.  Microcytic anemia #Chronic anemia hemoglobin 6-8; given prior to gastric sleeve surgery.  Recommend checking CBC CMP LDH reticulocyte count review of peripheral smear iron studies today.  #If patient is  truly iron deficient would recommend -IV Feraheme.Discussed the potential acute infusion reactions with IV iron; which are quite rare.  Patient understands the risk; will proceed with infusions.  #Etiology of iron deficiency is unclear-check UA.  Patient states to have never had colonoscopy.  Given the suspected iron deficient anemia I would recommend GI evaluation.  Defer to PCP.  #Goiter-TSH PCP office normal.  As per patient previous biopsy negative for malignancy.  Reviewed the records on PCPs office; summarized above.  Thank you Dr.Khan for allowing me to participate in the care of your pleasant patient. Please do not hesitate to contact me with questions or concerns in the interim.  # DISPOSITION:  # cbc/iron studies/ferritin/LDH/ UA today-  # in 1 week- IV ferrahem every 2 weeks X3; # follow up with MD-lab-cbc/possible Ferrahem in 2 months- Dr.B  All questions were answered. The patient knows to call the clinic with any problems, questions or concerns.    Cammie Sickle, MD 08/10/2018 4:00 PM

## 2018-08-10 NOTE — Progress Notes (Signed)
Patient has history of anemia and tolerating oral iron but lab levels are not improving.  Does have chronic migraines that is being treated and followed by Neurology.  Does not offer any symptoms such as fatigue, SOBr, or bleeding.

## 2018-08-10 NOTE — Telephone Encounter (Signed)
Patient called to cancel her 62-month bariatric post-op visit due to COVID-19 concerns. She reports she is doing well with her diet and weight. RD will plan to contact patient by phone to monitor progress; will schedule in person visit for weight check when virus risk has decreased.

## 2018-08-10 NOTE — Assessment & Plan Note (Addendum)
#  Chronic anemia hemoglobin 6-8; given prior to gastric sleeve surgery.  Recommend checking CBC CMP LDH reticulocyte count review of peripheral smear iron studies today.  #If patient is truly iron deficient would recommend -IV Feraheme.Discussed the potential acute infusion reactions with IV iron; which are quite rare.  Patient understands the risk; will proceed with infusions.  #Etiology of iron deficiency is unclear-check UA.  Patient states to have never had colonoscopy.  Given the suspected iron deficient anemia I would recommend GI evaluation.  Defer to PCP.  #Goiter-TSH PCP office normal.  As per patient previous biopsy negative for malignancy.  Reviewed the records on PCPs office; summarized above.  Thank you Dr.Khan for allowing me to participate in the care of your pleasant patient. Please do not hesitate to contact me with questions or concerns in the interim.  # DISPOSITION:  # cbc/iron studies/ferritin/LDH/ UA today-  # in 1 week- IV ferrahem every 2 weeks X3; # follow up with MD-lab-cbc/possible Ferrahem in 2 months- Dr.B

## 2018-08-18 ENCOUNTER — Inpatient Hospital Stay: Payer: Medicare HMO

## 2018-08-29 ENCOUNTER — Telehealth: Payer: Self-pay | Admitting: Internal Medicine

## 2018-08-29 NOTE — Telephone Encounter (Signed)
Pt called to cancel her IV iron infusion appointments.

## 2018-08-30 DIAGNOSIS — H43822 Vitreomacular adhesion, left eye: Secondary | ICD-10-CM | POA: Diagnosis not present

## 2018-08-30 DIAGNOSIS — H4061X3 Glaucoma secondary to drugs, right eye, severe stage: Secondary | ICD-10-CM | POA: Diagnosis not present

## 2018-08-30 DIAGNOSIS — H35372 Puckering of macula, left eye: Secondary | ICD-10-CM | POA: Diagnosis not present

## 2018-08-30 DIAGNOSIS — E113313 Type 2 diabetes mellitus with moderate nonproliferative diabetic retinopathy with macular edema, bilateral: Secondary | ICD-10-CM | POA: Diagnosis not present

## 2018-08-30 DIAGNOSIS — Z961 Presence of intraocular lens: Secondary | ICD-10-CM | POA: Diagnosis not present

## 2018-09-01 ENCOUNTER — Inpatient Hospital Stay: Payer: Medicare HMO

## 2018-09-12 DIAGNOSIS — R69 Illness, unspecified: Secondary | ICD-10-CM | POA: Diagnosis not present

## 2018-09-15 ENCOUNTER — Ambulatory Visit: Payer: Medicare HMO

## 2018-10-07 ENCOUNTER — Telehealth: Payer: Self-pay | Admitting: Dietician

## 2018-10-07 NOTE — Telephone Encounter (Signed)
Called patient to check on progress as she is now 8 months post-op bariatric surgery. Left a voicemail message requesting a call back.

## 2018-10-11 ENCOUNTER — Telehealth: Payer: Self-pay | Admitting: Internal Medicine

## 2018-10-11 NOTE — Telephone Encounter (Signed)
Patient had previously called to cancel appointments with [place].  Looks like she is still on scheduled to to see me/Feraheme infusion on Friday 21st.  Please confirm with the patient regarding appointment.  If she declines-cancel appointment.    No follow-ups with me.  Recommend patient follows up with PCP

## 2018-10-12 NOTE — Telephone Encounter (Signed)
Per Eldridge Abrahams, she left a msg to discuss her intent to keep her appointments. Waiting on patient to return the office's phone call.

## 2018-10-14 ENCOUNTER — Ambulatory Visit: Payer: Medicare HMO

## 2018-10-14 ENCOUNTER — Other Ambulatory Visit: Payer: Medicare HMO

## 2018-10-14 ENCOUNTER — Ambulatory Visit: Payer: Medicare HMO | Admitting: Internal Medicine

## 2018-10-31 DIAGNOSIS — R69 Illness, unspecified: Secondary | ICD-10-CM | POA: Diagnosis not present

## 2018-11-16 ENCOUNTER — Telehealth: Payer: Self-pay | Admitting: *Deleted

## 2018-11-16 NOTE — Telephone Encounter (Signed)
Cammie Sickle, MD  Chrismon, Tomi Likens, CMA; Sabino Gasser, RN; Alric Quan, CMA        H/B- what this patient finally want ? Can you please check- she does not have any scheudle with [place].  Thanks   Previous Messages  ----- Message -----  From: Floy Sabina  Sent: 10/11/2018  3:54 PM EDT  To: Vanice Sarah, CMA, Sabino Gasser, RN, *  Subject: RE: Shirlean Kelly versus Venofer            OK. Thanks a bunch.  ----- Message -----  From: Cammie Sickle, MD  Sent: 10/11/2018  3:51 PM EDT  To: Vanice Sarah, CMA, Sabino Gasser, RN, *  Subject: RE: Shirlean Kelly versus Venofer            Luanne-hold off authorization for Venofer at this time. Patient had called previously to cancel appointments. Will await to confirm with the patient if she wants to come for appointments.  Thx  GB

## 2018-11-16 NOTE — Telephone Encounter (Signed)
Contacted patient. I left a vm for patient-requesting a return call to discuss her future apts in our clinic.

## 2018-11-22 ENCOUNTER — Telehealth: Payer: Self-pay | Admitting: Internal Medicine

## 2018-11-22 NOTE — Telephone Encounter (Signed)
Per last telephone call pt declines any further appts at this time. Was told to please f/u with her PCP and if needs [place] again dont hesitate to call us back to make appt.

## 2018-12-07 DIAGNOSIS — D519 Vitamin B12 deficiency anemia, unspecified: Secondary | ICD-10-CM | POA: Diagnosis not present

## 2018-12-07 DIAGNOSIS — I10 Essential (primary) hypertension: Secondary | ICD-10-CM | POA: Diagnosis not present

## 2018-12-07 DIAGNOSIS — Z9884 Bariatric surgery status: Secondary | ICD-10-CM | POA: Diagnosis not present

## 2018-12-07 DIAGNOSIS — E118 Type 2 diabetes mellitus with unspecified complications: Secondary | ICD-10-CM | POA: Diagnosis not present

## 2018-12-07 DIAGNOSIS — E559 Vitamin D deficiency, unspecified: Secondary | ICD-10-CM | POA: Diagnosis not present

## 2018-12-07 DIAGNOSIS — E782 Mixed hyperlipidemia: Secondary | ICD-10-CM | POA: Diagnosis not present

## 2018-12-07 DIAGNOSIS — M109 Gout, unspecified: Secondary | ICD-10-CM | POA: Diagnosis not present

## 2018-12-07 DIAGNOSIS — D509 Iron deficiency anemia, unspecified: Secondary | ICD-10-CM | POA: Diagnosis not present

## 2018-12-13 DIAGNOSIS — H35372 Puckering of macula, left eye: Secondary | ICD-10-CM | POA: Diagnosis not present

## 2018-12-13 DIAGNOSIS — E113313 Type 2 diabetes mellitus with moderate nonproliferative diabetic retinopathy with macular edema, bilateral: Secondary | ICD-10-CM | POA: Diagnosis not present

## 2018-12-13 DIAGNOSIS — Z961 Presence of intraocular lens: Secondary | ICD-10-CM | POA: Diagnosis not present

## 2018-12-13 DIAGNOSIS — T380X5A Adverse effect of glucocorticoids and synthetic analogues, initial encounter: Secondary | ICD-10-CM | POA: Diagnosis not present

## 2018-12-13 DIAGNOSIS — H43822 Vitreomacular adhesion, left eye: Secondary | ICD-10-CM | POA: Diagnosis not present

## 2018-12-13 DIAGNOSIS — H4061X3 Glaucoma secondary to drugs, right eye, severe stage: Secondary | ICD-10-CM | POA: Diagnosis not present

## 2018-12-16 DIAGNOSIS — Z23 Encounter for immunization: Secondary | ICD-10-CM | POA: Diagnosis not present

## 2018-12-16 DIAGNOSIS — G43019 Migraine without aura, intractable, without status migrainosus: Secondary | ICD-10-CM | POA: Diagnosis not present

## 2018-12-16 DIAGNOSIS — R29898 Other symptoms and signs involving the musculoskeletal system: Secondary | ICD-10-CM | POA: Diagnosis not present

## 2018-12-20 DIAGNOSIS — R69 Illness, unspecified: Secondary | ICD-10-CM | POA: Diagnosis not present

## 2019-01-02 DIAGNOSIS — R69 Illness, unspecified: Secondary | ICD-10-CM | POA: Diagnosis not present

## 2019-01-04 DIAGNOSIS — E559 Vitamin D deficiency, unspecified: Secondary | ICD-10-CM | POA: Diagnosis not present

## 2019-01-04 DIAGNOSIS — G43839 Menstrual migraine, intractable, without status migrainosus: Secondary | ICD-10-CM | POA: Diagnosis not present

## 2019-01-04 DIAGNOSIS — Z79899 Other long term (current) drug therapy: Secondary | ICD-10-CM | POA: Diagnosis not present

## 2019-01-04 DIAGNOSIS — E119 Type 2 diabetes mellitus without complications: Secondary | ICD-10-CM | POA: Diagnosis not present

## 2019-01-04 DIAGNOSIS — Z9181 History of falling: Secondary | ICD-10-CM | POA: Diagnosis not present

## 2019-01-04 DIAGNOSIS — E538 Deficiency of other specified B group vitamins: Secondary | ICD-10-CM | POA: Diagnosis not present

## 2019-01-04 DIAGNOSIS — I10 Essential (primary) hypertension: Secondary | ICD-10-CM | POA: Diagnosis not present

## 2019-01-09 DIAGNOSIS — Z0100 Encounter for examination of eyes and vision without abnormal findings: Secondary | ICD-10-CM | POA: Diagnosis not present

## 2019-01-10 DIAGNOSIS — Z79899 Other long term (current) drug therapy: Secondary | ICD-10-CM | POA: Diagnosis not present

## 2019-01-10 DIAGNOSIS — Z9181 History of falling: Secondary | ICD-10-CM | POA: Diagnosis not present

## 2019-01-10 DIAGNOSIS — E559 Vitamin D deficiency, unspecified: Secondary | ICD-10-CM | POA: Diagnosis not present

## 2019-01-10 DIAGNOSIS — E119 Type 2 diabetes mellitus without complications: Secondary | ICD-10-CM | POA: Diagnosis not present

## 2019-01-10 DIAGNOSIS — E538 Deficiency of other specified B group vitamins: Secondary | ICD-10-CM | POA: Diagnosis not present

## 2019-01-10 DIAGNOSIS — G43839 Menstrual migraine, intractable, without status migrainosus: Secondary | ICD-10-CM | POA: Diagnosis not present

## 2019-01-10 DIAGNOSIS — I10 Essential (primary) hypertension: Secondary | ICD-10-CM | POA: Diagnosis not present

## 2019-01-12 DIAGNOSIS — E559 Vitamin D deficiency, unspecified: Secondary | ICD-10-CM | POA: Diagnosis not present

## 2019-01-12 DIAGNOSIS — Z9181 History of falling: Secondary | ICD-10-CM | POA: Diagnosis not present

## 2019-01-12 DIAGNOSIS — E538 Deficiency of other specified B group vitamins: Secondary | ICD-10-CM | POA: Diagnosis not present

## 2019-01-12 DIAGNOSIS — G43839 Menstrual migraine, intractable, without status migrainosus: Secondary | ICD-10-CM | POA: Diagnosis not present

## 2019-01-12 DIAGNOSIS — Z79899 Other long term (current) drug therapy: Secondary | ICD-10-CM | POA: Diagnosis not present

## 2019-01-12 DIAGNOSIS — I10 Essential (primary) hypertension: Secondary | ICD-10-CM | POA: Diagnosis not present

## 2019-01-12 DIAGNOSIS — E119 Type 2 diabetes mellitus without complications: Secondary | ICD-10-CM | POA: Diagnosis not present

## 2019-01-16 DIAGNOSIS — E119 Type 2 diabetes mellitus without complications: Secondary | ICD-10-CM | POA: Diagnosis not present

## 2019-01-16 DIAGNOSIS — E538 Deficiency of other specified B group vitamins: Secondary | ICD-10-CM | POA: Diagnosis not present

## 2019-01-16 DIAGNOSIS — I10 Essential (primary) hypertension: Secondary | ICD-10-CM | POA: Diagnosis not present

## 2019-01-16 DIAGNOSIS — Z79899 Other long term (current) drug therapy: Secondary | ICD-10-CM | POA: Diagnosis not present

## 2019-01-16 DIAGNOSIS — Z9181 History of falling: Secondary | ICD-10-CM | POA: Diagnosis not present

## 2019-01-16 DIAGNOSIS — G43839 Menstrual migraine, intractable, without status migrainosus: Secondary | ICD-10-CM | POA: Diagnosis not present

## 2019-01-16 DIAGNOSIS — E559 Vitamin D deficiency, unspecified: Secondary | ICD-10-CM | POA: Diagnosis not present

## 2019-01-20 DIAGNOSIS — E559 Vitamin D deficiency, unspecified: Secondary | ICD-10-CM | POA: Diagnosis not present

## 2019-01-20 DIAGNOSIS — Z9181 History of falling: Secondary | ICD-10-CM | POA: Diagnosis not present

## 2019-01-20 DIAGNOSIS — G43839 Menstrual migraine, intractable, without status migrainosus: Secondary | ICD-10-CM | POA: Diagnosis not present

## 2019-01-20 DIAGNOSIS — E538 Deficiency of other specified B group vitamins: Secondary | ICD-10-CM | POA: Diagnosis not present

## 2019-01-20 DIAGNOSIS — I10 Essential (primary) hypertension: Secondary | ICD-10-CM | POA: Diagnosis not present

## 2019-01-20 DIAGNOSIS — E119 Type 2 diabetes mellitus without complications: Secondary | ICD-10-CM | POA: Diagnosis not present

## 2019-01-20 DIAGNOSIS — Z79899 Other long term (current) drug therapy: Secondary | ICD-10-CM | POA: Diagnosis not present

## 2019-01-23 DIAGNOSIS — E538 Deficiency of other specified B group vitamins: Secondary | ICD-10-CM | POA: Diagnosis not present

## 2019-01-23 DIAGNOSIS — E119 Type 2 diabetes mellitus without complications: Secondary | ICD-10-CM | POA: Diagnosis not present

## 2019-01-23 DIAGNOSIS — G43839 Menstrual migraine, intractable, without status migrainosus: Secondary | ICD-10-CM | POA: Diagnosis not present

## 2019-01-23 DIAGNOSIS — Z9181 History of falling: Secondary | ICD-10-CM | POA: Diagnosis not present

## 2019-01-23 DIAGNOSIS — R69 Illness, unspecified: Secondary | ICD-10-CM | POA: Diagnosis not present

## 2019-01-23 DIAGNOSIS — E559 Vitamin D deficiency, unspecified: Secondary | ICD-10-CM | POA: Diagnosis not present

## 2019-01-23 DIAGNOSIS — Z79899 Other long term (current) drug therapy: Secondary | ICD-10-CM | POA: Diagnosis not present

## 2019-01-23 DIAGNOSIS — I10 Essential (primary) hypertension: Secondary | ICD-10-CM | POA: Diagnosis not present

## 2019-01-26 DIAGNOSIS — E559 Vitamin D deficiency, unspecified: Secondary | ICD-10-CM | POA: Diagnosis not present

## 2019-01-26 DIAGNOSIS — E119 Type 2 diabetes mellitus without complications: Secondary | ICD-10-CM | POA: Diagnosis not present

## 2019-01-26 DIAGNOSIS — Z79899 Other long term (current) drug therapy: Secondary | ICD-10-CM | POA: Diagnosis not present

## 2019-01-26 DIAGNOSIS — G43839 Menstrual migraine, intractable, without status migrainosus: Secondary | ICD-10-CM | POA: Diagnosis not present

## 2019-01-26 DIAGNOSIS — E538 Deficiency of other specified B group vitamins: Secondary | ICD-10-CM | POA: Diagnosis not present

## 2019-01-26 DIAGNOSIS — Z9181 History of falling: Secondary | ICD-10-CM | POA: Diagnosis not present

## 2019-01-26 DIAGNOSIS — I10 Essential (primary) hypertension: Secondary | ICD-10-CM | POA: Diagnosis not present

## 2019-01-30 DIAGNOSIS — E119 Type 2 diabetes mellitus without complications: Secondary | ICD-10-CM | POA: Diagnosis not present

## 2019-01-30 DIAGNOSIS — I10 Essential (primary) hypertension: Secondary | ICD-10-CM | POA: Diagnosis not present

## 2019-01-30 DIAGNOSIS — E559 Vitamin D deficiency, unspecified: Secondary | ICD-10-CM | POA: Diagnosis not present

## 2019-01-30 DIAGNOSIS — E538 Deficiency of other specified B group vitamins: Secondary | ICD-10-CM | POA: Diagnosis not present

## 2019-01-30 DIAGNOSIS — Z79899 Other long term (current) drug therapy: Secondary | ICD-10-CM | POA: Diagnosis not present

## 2019-01-30 DIAGNOSIS — G43839 Menstrual migraine, intractable, without status migrainosus: Secondary | ICD-10-CM | POA: Diagnosis not present

## 2019-01-30 DIAGNOSIS — Z9181 History of falling: Secondary | ICD-10-CM | POA: Diagnosis not present

## 2019-02-06 DIAGNOSIS — E119 Type 2 diabetes mellitus without complications: Secondary | ICD-10-CM | POA: Diagnosis not present

## 2019-02-06 DIAGNOSIS — E538 Deficiency of other specified B group vitamins: Secondary | ICD-10-CM | POA: Diagnosis not present

## 2019-02-06 DIAGNOSIS — G43839 Menstrual migraine, intractable, without status migrainosus: Secondary | ICD-10-CM | POA: Diagnosis not present

## 2019-02-06 DIAGNOSIS — Z79899 Other long term (current) drug therapy: Secondary | ICD-10-CM | POA: Diagnosis not present

## 2019-02-06 DIAGNOSIS — E559 Vitamin D deficiency, unspecified: Secondary | ICD-10-CM | POA: Diagnosis not present

## 2019-02-06 DIAGNOSIS — I10 Essential (primary) hypertension: Secondary | ICD-10-CM | POA: Diagnosis not present

## 2019-02-06 DIAGNOSIS — Z9181 History of falling: Secondary | ICD-10-CM | POA: Diagnosis not present

## 2019-02-22 DIAGNOSIS — R809 Proteinuria, unspecified: Secondary | ICD-10-CM | POA: Diagnosis not present

## 2019-02-22 DIAGNOSIS — E1122 Type 2 diabetes mellitus with diabetic chronic kidney disease: Secondary | ICD-10-CM | POA: Diagnosis not present

## 2019-02-22 DIAGNOSIS — N1832 Chronic kidney disease, stage 3b: Secondary | ICD-10-CM | POA: Insufficient documentation

## 2019-02-22 DIAGNOSIS — D631 Anemia in chronic kidney disease: Secondary | ICD-10-CM | POA: Diagnosis not present

## 2019-02-22 DIAGNOSIS — I129 Hypertensive chronic kidney disease with stage 1 through stage 4 chronic kidney disease, or unspecified chronic kidney disease: Secondary | ICD-10-CM | POA: Diagnosis not present

## 2019-03-02 ENCOUNTER — Other Ambulatory Visit: Payer: Self-pay | Admitting: Nephrology

## 2019-03-02 DIAGNOSIS — D631 Anemia in chronic kidney disease: Secondary | ICD-10-CM

## 2019-03-02 DIAGNOSIS — N183 Chronic kidney disease, stage 3 unspecified: Secondary | ICD-10-CM

## 2019-03-02 DIAGNOSIS — I129 Hypertensive chronic kidney disease with stage 1 through stage 4 chronic kidney disease, or unspecified chronic kidney disease: Secondary | ICD-10-CM

## 2019-03-02 DIAGNOSIS — R809 Proteinuria, unspecified: Secondary | ICD-10-CM

## 2019-03-02 DIAGNOSIS — N189 Chronic kidney disease, unspecified: Secondary | ICD-10-CM

## 2019-03-02 DIAGNOSIS — N1831 Chronic kidney disease, stage 3a: Secondary | ICD-10-CM

## 2019-03-09 DIAGNOSIS — G43909 Migraine, unspecified, not intractable, without status migrainosus: Secondary | ICD-10-CM | POA: Diagnosis not present

## 2019-03-09 DIAGNOSIS — H409 Unspecified glaucoma: Secondary | ICD-10-CM | POA: Diagnosis not present

## 2019-03-09 DIAGNOSIS — E118 Type 2 diabetes mellitus with unspecified complications: Secondary | ICD-10-CM | POA: Diagnosis not present

## 2019-03-09 DIAGNOSIS — E782 Mixed hyperlipidemia: Secondary | ICD-10-CM | POA: Diagnosis not present

## 2019-03-09 DIAGNOSIS — I10 Essential (primary) hypertension: Secondary | ICD-10-CM | POA: Diagnosis not present

## 2019-03-09 DIAGNOSIS — L0231 Cutaneous abscess of buttock: Secondary | ICD-10-CM | POA: Diagnosis not present

## 2019-03-09 DIAGNOSIS — M25519 Pain in unspecified shoulder: Secondary | ICD-10-CM | POA: Diagnosis not present

## 2019-03-23 DIAGNOSIS — E118 Type 2 diabetes mellitus with unspecified complications: Secondary | ICD-10-CM | POA: Diagnosis not present

## 2019-03-23 DIAGNOSIS — E785 Hyperlipidemia, unspecified: Secondary | ICD-10-CM | POA: Diagnosis not present

## 2019-03-23 DIAGNOSIS — I10 Essential (primary) hypertension: Secondary | ICD-10-CM | POA: Diagnosis not present

## 2019-03-23 DIAGNOSIS — M25519 Pain in unspecified shoulder: Secondary | ICD-10-CM | POA: Diagnosis not present

## 2019-03-23 DIAGNOSIS — L0231 Cutaneous abscess of buttock: Secondary | ICD-10-CM | POA: Diagnosis not present

## 2019-04-04 DIAGNOSIS — E113213 Type 2 diabetes mellitus with mild nonproliferative diabetic retinopathy with macular edema, bilateral: Secondary | ICD-10-CM | POA: Diagnosis not present

## 2019-04-04 DIAGNOSIS — Z961 Presence of intraocular lens: Secondary | ICD-10-CM | POA: Diagnosis not present

## 2019-04-21 DIAGNOSIS — R29898 Other symptoms and signs involving the musculoskeletal system: Secondary | ICD-10-CM | POA: Diagnosis not present

## 2019-04-21 DIAGNOSIS — G43019 Migraine without aura, intractable, without status migrainosus: Secondary | ICD-10-CM | POA: Diagnosis not present

## 2019-04-21 DIAGNOSIS — M5481 Occipital neuralgia: Secondary | ICD-10-CM | POA: Diagnosis not present

## 2019-04-25 DIAGNOSIS — T380X5A Adverse effect of glucocorticoids and synthetic analogues, initial encounter: Secondary | ICD-10-CM | POA: Diagnosis not present

## 2019-04-25 DIAGNOSIS — H4063X4 Glaucoma secondary to drugs, bilateral, indeterminate stage: Secondary | ICD-10-CM | POA: Diagnosis not present

## 2019-04-25 DIAGNOSIS — R69 Illness, unspecified: Secondary | ICD-10-CM | POA: Diagnosis not present

## 2019-04-25 DIAGNOSIS — H35353 Cystoid macular degeneration, bilateral: Secondary | ICD-10-CM | POA: Diagnosis not present

## 2019-04-25 DIAGNOSIS — E113393 Type 2 diabetes mellitus with moderate nonproliferative diabetic retinopathy without macular edema, bilateral: Secondary | ICD-10-CM | POA: Diagnosis not present

## 2019-06-22 DIAGNOSIS — M109 Gout, unspecified: Secondary | ICD-10-CM | POA: Diagnosis not present

## 2019-06-22 DIAGNOSIS — M19019 Primary osteoarthritis, unspecified shoulder: Secondary | ICD-10-CM | POA: Diagnosis not present

## 2019-06-22 DIAGNOSIS — G43909 Migraine, unspecified, not intractable, without status migrainosus: Secondary | ICD-10-CM | POA: Diagnosis not present

## 2019-06-22 DIAGNOSIS — E559 Vitamin D deficiency, unspecified: Secondary | ICD-10-CM | POA: Diagnosis not present

## 2019-06-22 DIAGNOSIS — D519 Vitamin B12 deficiency anemia, unspecified: Secondary | ICD-10-CM | POA: Diagnosis not present

## 2019-06-22 DIAGNOSIS — E785 Hyperlipidemia, unspecified: Secondary | ICD-10-CM | POA: Diagnosis not present

## 2019-06-22 DIAGNOSIS — D509 Iron deficiency anemia, unspecified: Secondary | ICD-10-CM | POA: Diagnosis not present

## 2019-06-22 DIAGNOSIS — I10 Essential (primary) hypertension: Secondary | ICD-10-CM | POA: Diagnosis not present

## 2019-06-22 DIAGNOSIS — E118 Type 2 diabetes mellitus with unspecified complications: Secondary | ICD-10-CM | POA: Diagnosis not present

## 2019-07-21 DIAGNOSIS — R69 Illness, unspecified: Secondary | ICD-10-CM | POA: Diagnosis not present

## 2019-08-15 DIAGNOSIS — H35372 Puckering of macula, left eye: Secondary | ICD-10-CM | POA: Diagnosis not present

## 2019-08-15 DIAGNOSIS — H4061X3 Glaucoma secondary to drugs, right eye, severe stage: Secondary | ICD-10-CM | POA: Diagnosis not present

## 2019-08-15 DIAGNOSIS — Z961 Presence of intraocular lens: Secondary | ICD-10-CM | POA: Diagnosis not present

## 2019-08-15 DIAGNOSIS — E113313 Type 2 diabetes mellitus with moderate nonproliferative diabetic retinopathy with macular edema, bilateral: Secondary | ICD-10-CM | POA: Diagnosis not present

## 2019-08-15 DIAGNOSIS — H43822 Vitreomacular adhesion, left eye: Secondary | ICD-10-CM | POA: Diagnosis not present

## 2019-09-08 ENCOUNTER — Encounter (HOSPITAL_COMMUNITY): Payer: Self-pay

## 2019-09-20 DIAGNOSIS — R69 Illness, unspecified: Secondary | ICD-10-CM | POA: Diagnosis not present

## 2019-09-21 DIAGNOSIS — M109 Gout, unspecified: Secondary | ICD-10-CM | POA: Diagnosis not present

## 2019-09-21 DIAGNOSIS — D519 Vitamin B12 deficiency anemia, unspecified: Secondary | ICD-10-CM | POA: Diagnosis not present

## 2019-09-21 DIAGNOSIS — E559 Vitamin D deficiency, unspecified: Secondary | ICD-10-CM | POA: Diagnosis not present

## 2019-09-21 DIAGNOSIS — R5383 Other fatigue: Secondary | ICD-10-CM | POA: Diagnosis not present

## 2019-09-21 DIAGNOSIS — K219 Gastro-esophageal reflux disease without esophagitis: Secondary | ICD-10-CM | POA: Diagnosis not present

## 2019-09-21 DIAGNOSIS — Z9884 Bariatric surgery status: Secondary | ICD-10-CM | POA: Diagnosis not present

## 2019-09-21 DIAGNOSIS — E782 Mixed hyperlipidemia: Secondary | ICD-10-CM | POA: Diagnosis not present

## 2019-09-21 DIAGNOSIS — E118 Type 2 diabetes mellitus with unspecified complications: Secondary | ICD-10-CM | POA: Diagnosis not present

## 2019-09-21 DIAGNOSIS — I10 Essential (primary) hypertension: Secondary | ICD-10-CM | POA: Diagnosis not present

## 2019-09-21 DIAGNOSIS — D509 Iron deficiency anemia, unspecified: Secondary | ICD-10-CM | POA: Diagnosis not present

## 2019-10-19 DIAGNOSIS — R69 Illness, unspecified: Secondary | ICD-10-CM | POA: Diagnosis not present

## 2019-11-15 DIAGNOSIS — T380X5A Adverse effect of glucocorticoids and synthetic analogues, initial encounter: Secondary | ICD-10-CM | POA: Diagnosis not present

## 2019-11-15 DIAGNOSIS — H4061X3 Glaucoma secondary to drugs, right eye, severe stage: Secondary | ICD-10-CM | POA: Diagnosis not present

## 2019-11-15 DIAGNOSIS — Z961 Presence of intraocular lens: Secondary | ICD-10-CM | POA: Diagnosis not present

## 2019-11-15 DIAGNOSIS — E113313 Type 2 diabetes mellitus with moderate nonproliferative diabetic retinopathy with macular edema, bilateral: Secondary | ICD-10-CM | POA: Diagnosis not present

## 2019-11-22 DIAGNOSIS — R69 Illness, unspecified: Secondary | ICD-10-CM | POA: Diagnosis not present

## 2019-12-20 DIAGNOSIS — R69 Illness, unspecified: Secondary | ICD-10-CM | POA: Diagnosis not present

## 2019-12-22 DIAGNOSIS — R69 Illness, unspecified: Secondary | ICD-10-CM | POA: Diagnosis not present

## 2019-12-22 DIAGNOSIS — Z13220 Encounter for screening for lipoid disorders: Secondary | ICD-10-CM | POA: Diagnosis not present

## 2019-12-22 DIAGNOSIS — M109 Gout, unspecified: Secondary | ICD-10-CM | POA: Diagnosis not present

## 2019-12-22 DIAGNOSIS — D519 Vitamin B12 deficiency anemia, unspecified: Secondary | ICD-10-CM | POA: Diagnosis not present

## 2019-12-22 DIAGNOSIS — Z23 Encounter for immunization: Secondary | ICD-10-CM | POA: Diagnosis not present

## 2019-12-22 DIAGNOSIS — D509 Iron deficiency anemia, unspecified: Secondary | ICD-10-CM | POA: Diagnosis not present

## 2019-12-22 DIAGNOSIS — I10 Essential (primary) hypertension: Secondary | ICD-10-CM | POA: Diagnosis not present

## 2019-12-22 DIAGNOSIS — E118 Type 2 diabetes mellitus with unspecified complications: Secondary | ICD-10-CM | POA: Diagnosis not present

## 2019-12-22 DIAGNOSIS — E559 Vitamin D deficiency, unspecified: Secondary | ICD-10-CM | POA: Diagnosis not present

## 2020-01-19 DIAGNOSIS — R69 Illness, unspecified: Secondary | ICD-10-CM | POA: Diagnosis not present

## 2020-02-07 DIAGNOSIS — E113313 Type 2 diabetes mellitus with moderate nonproliferative diabetic retinopathy with macular edema, bilateral: Secondary | ICD-10-CM | POA: Diagnosis not present

## 2020-02-07 DIAGNOSIS — Z961 Presence of intraocular lens: Secondary | ICD-10-CM | POA: Diagnosis not present

## 2020-02-13 DIAGNOSIS — T380X5A Adverse effect of glucocorticoids and synthetic analogues, initial encounter: Secondary | ICD-10-CM | POA: Diagnosis not present

## 2020-02-13 DIAGNOSIS — H4311 Vitreous hemorrhage, right eye: Secondary | ICD-10-CM | POA: Diagnosis not present

## 2020-02-13 DIAGNOSIS — E113313 Type 2 diabetes mellitus with moderate nonproliferative diabetic retinopathy with macular edema, bilateral: Secondary | ICD-10-CM | POA: Diagnosis not present

## 2020-02-13 DIAGNOSIS — H4061X3 Glaucoma secondary to drugs, right eye, severe stage: Secondary | ICD-10-CM | POA: Diagnosis not present

## 2020-02-13 DIAGNOSIS — Z961 Presence of intraocular lens: Secondary | ICD-10-CM | POA: Diagnosis not present

## 2020-05-08 DIAGNOSIS — H47239 Glaucomatous optic atrophy, unspecified eye: Secondary | ICD-10-CM | POA: Diagnosis not present

## 2020-05-08 DIAGNOSIS — E113313 Type 2 diabetes mellitus with moderate nonproliferative diabetic retinopathy with macular edema, bilateral: Secondary | ICD-10-CM | POA: Diagnosis not present

## 2020-05-08 DIAGNOSIS — E113219 Type 2 diabetes mellitus with mild nonproliferative diabetic retinopathy with macular edema, unspecified eye: Secondary | ICD-10-CM | POA: Diagnosis not present

## 2020-05-08 DIAGNOSIS — Z961 Presence of intraocular lens: Secondary | ICD-10-CM | POA: Diagnosis not present

## 2020-05-08 DIAGNOSIS — H4311 Vitreous hemorrhage, right eye: Secondary | ICD-10-CM | POA: Diagnosis not present

## 2020-05-08 DIAGNOSIS — H3581 Retinal edema: Secondary | ICD-10-CM | POA: Diagnosis not present

## 2020-06-02 IMAGING — CR DG CHEST 2V
1 series · 2 of 2 positions shown · non-contrast
Comparison: 08/22/2009

CLINICAL DATA: Morbid obesity. Pre-op evaluation for bariatric
surgery.

EXAM:
CHEST - 2 VIEW

[Series 1: dg chest 2 view · 0.14mm/px · 2 of 2 slices shown]
[im 1/2]
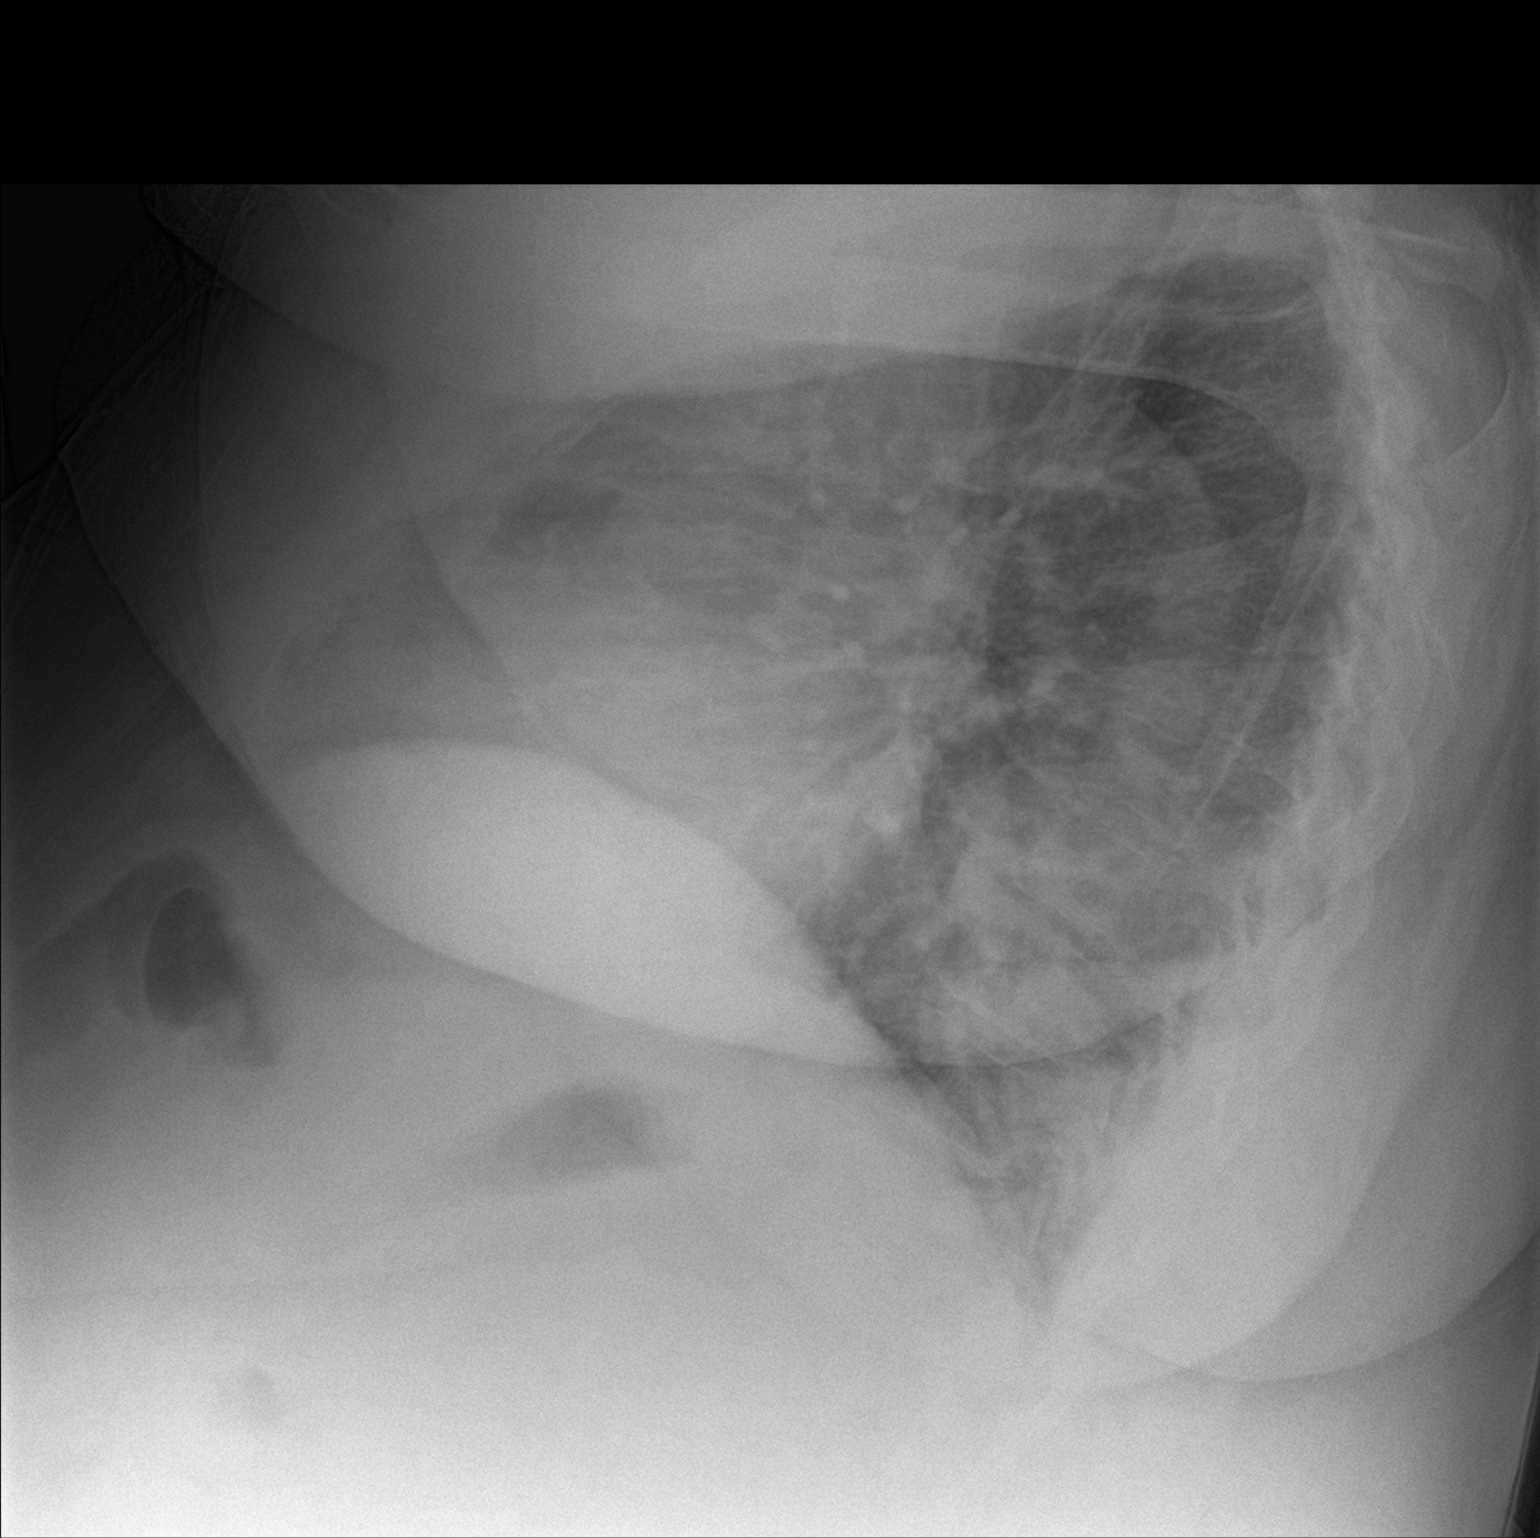
[im 2/2]
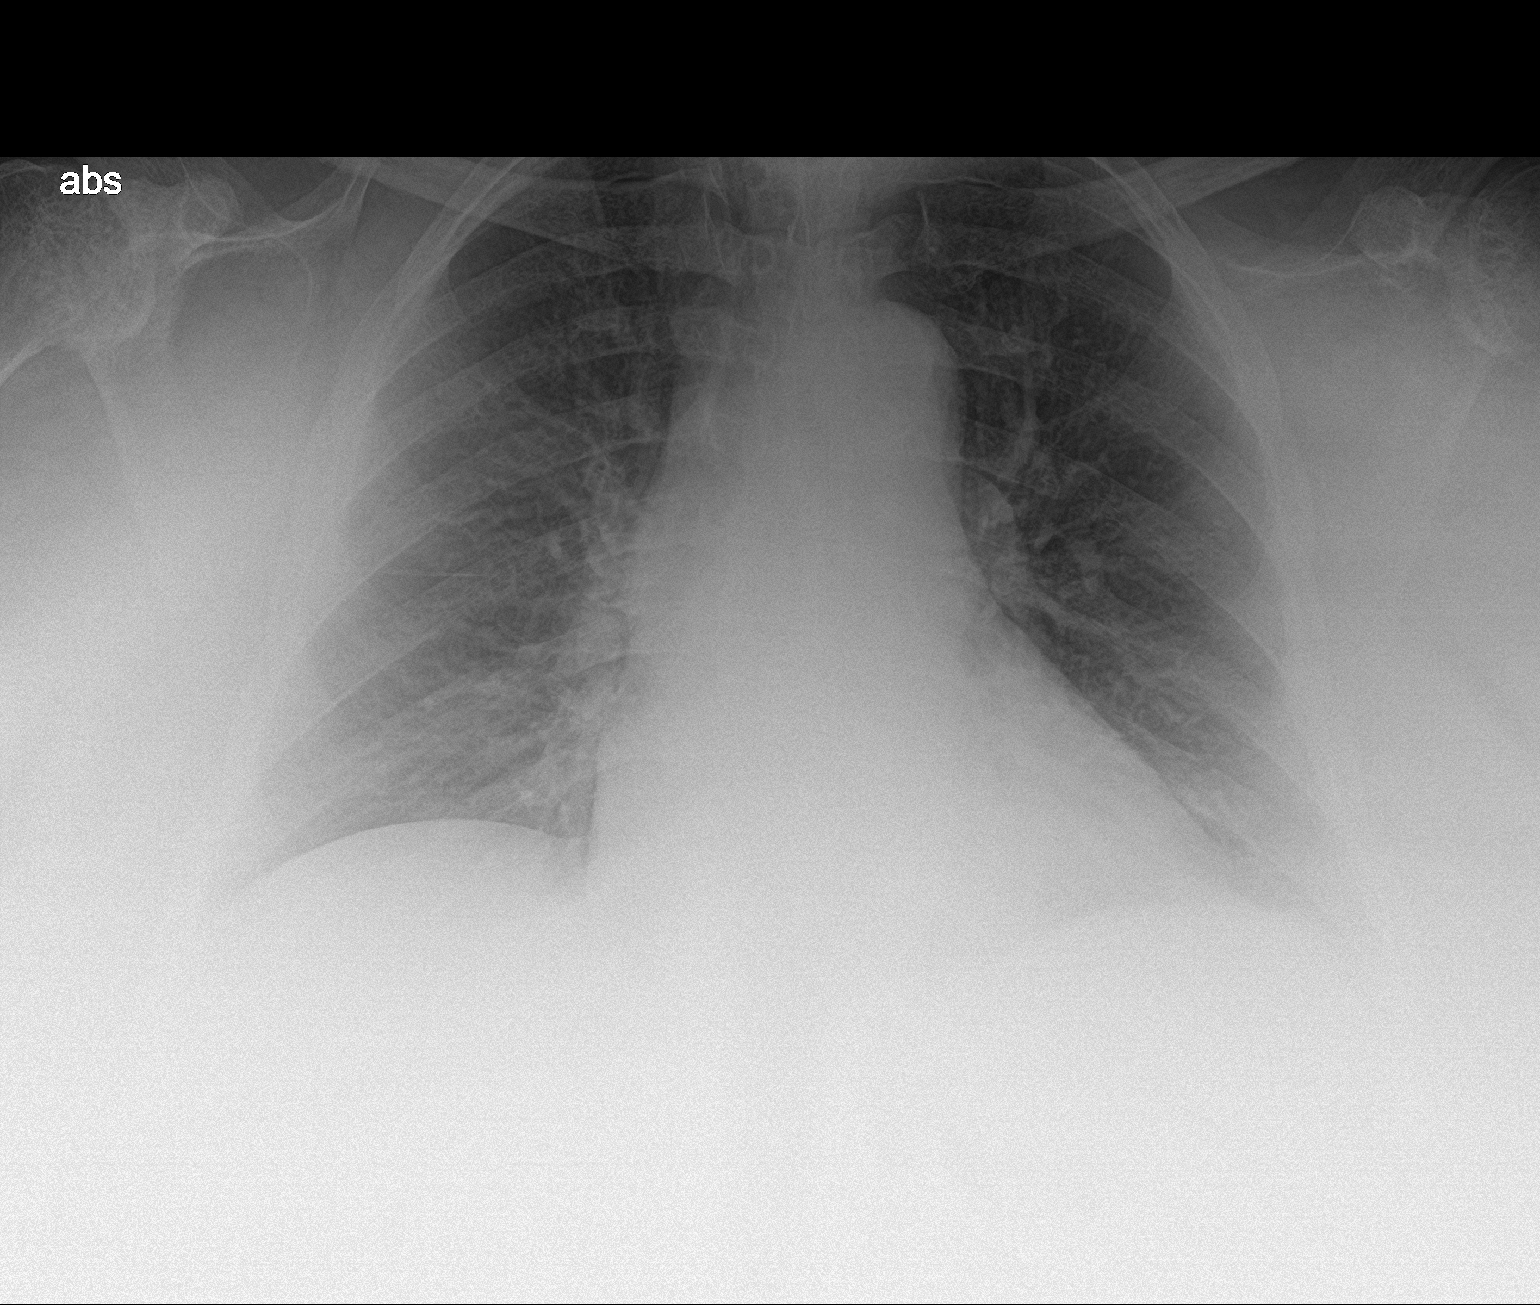

[2 of 2 positions shown; findings below may reference images not displayed]

FINDINGS: The heart size and mediastinal contours are within normal limits.
Both lungs are clear. The visualized skeletal structures are
unremarkable.
IMPRESSION: No active cardiopulmonary disease.

## 2020-06-02 IMAGING — RF DG UGI W/ KUB
10 of 16 series · 12 of 19 positions shown · non-contrast
Comparison: None.

CLINICAL DATA: Morbid obesity.  Preop for gastric bypass.

EXAM:
UPPER GI SERIES WITH KUB
TECHNIQUE: After obtaining a scout radiograph a routine upper GI series was
performed using thin and high density barium.
FLUOROSCOPY TIME:  Fluoroscopy Time:  0.8 minute
Radiation Exposure Index (if provided by the fluoroscopic device):
106.5 mGy
Number of Acquired Spot Images: 0

[Series 1: t abdomen supine · 0.15mm/px · 1 of 1 slices shown (1 of 3)]
[im 1/1]
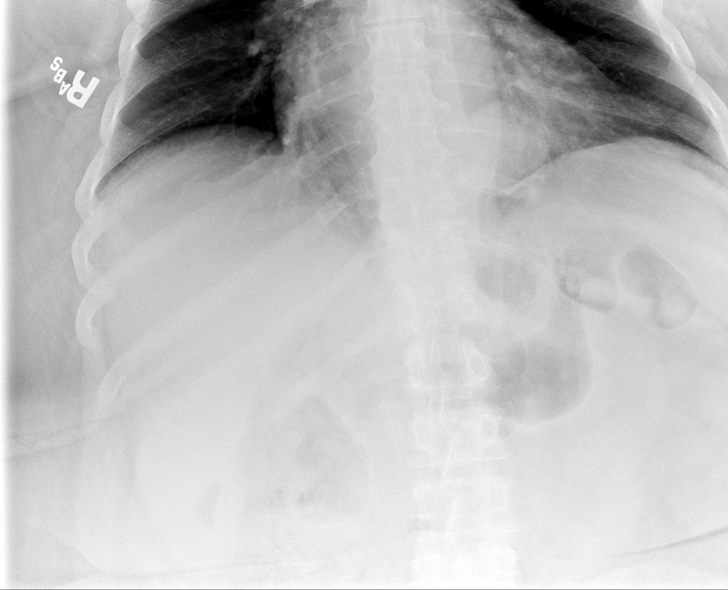

[Series 3: t abdomen supine · 0.15mm/px · 1 of 1 slices shown (2 of 3)]
[im 1/1]
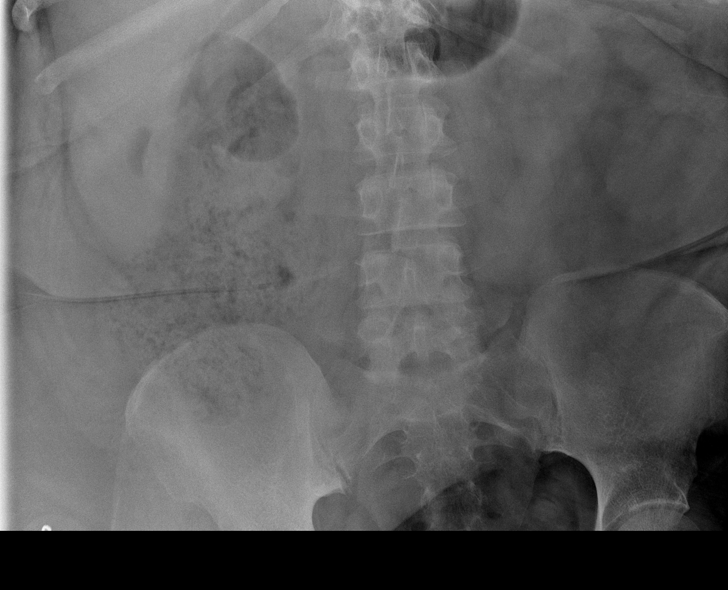

[Series 4: cp_standard · 0.26mm/px · 1 of 1 slices shown (1 of 5)]
[im 1/1]
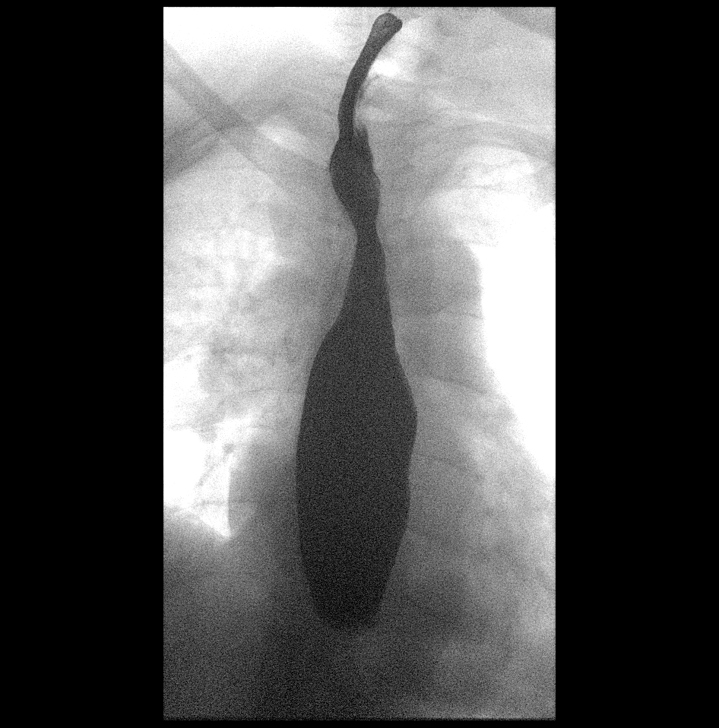

[Series 6: cp_standard · 0.26mm/px · 1 of 1 slices shown (2 of 5)]
[im 1/1]
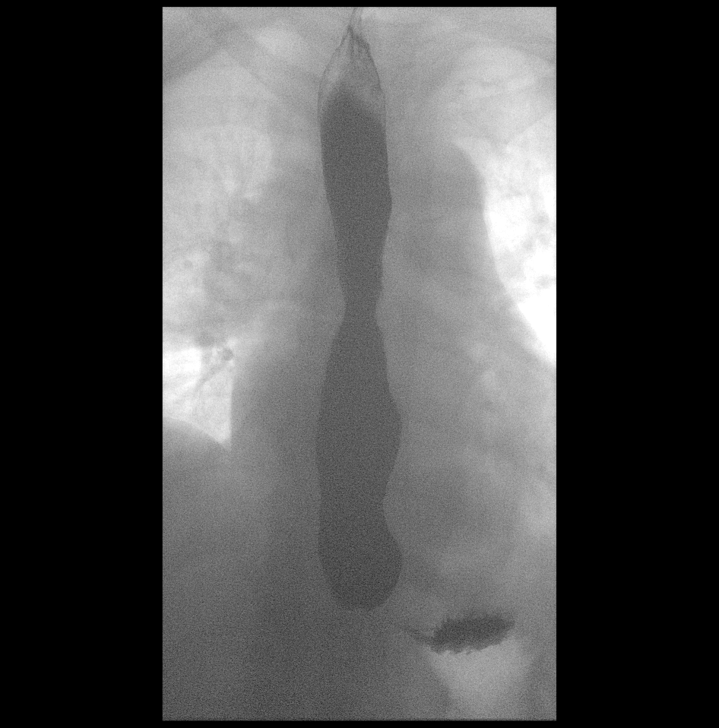

[Series 8: cp_standard · 0.26mm/px · 1 of 1 slices shown (3 of 5)]
[im 1/1]
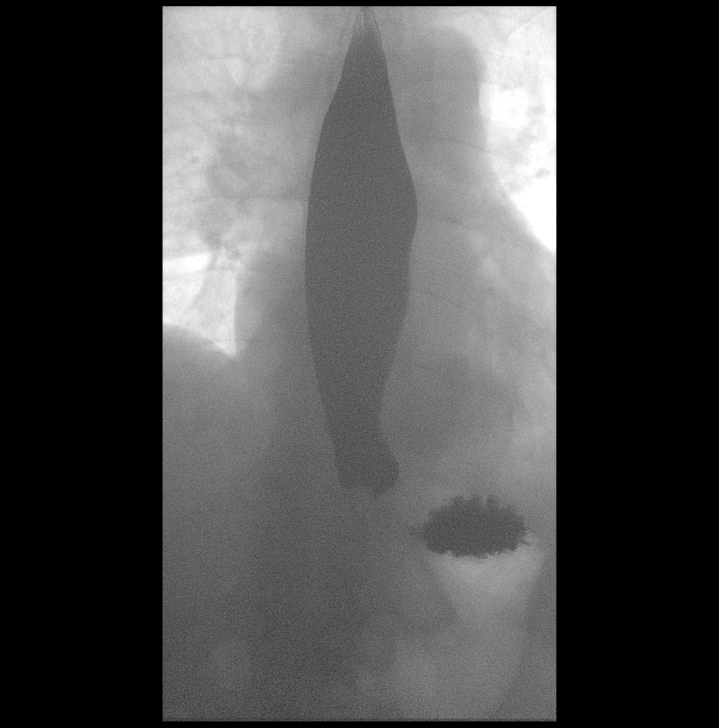

[Series 9: cp_standard · 0.53mm/px · 3 of 31 frames shown (4 of 5)]
[frame 5/31]
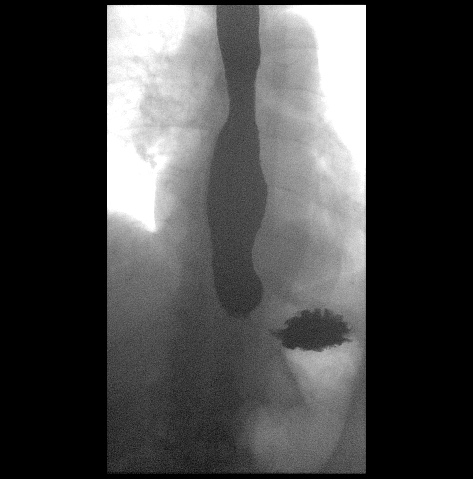
[frame 27/31]
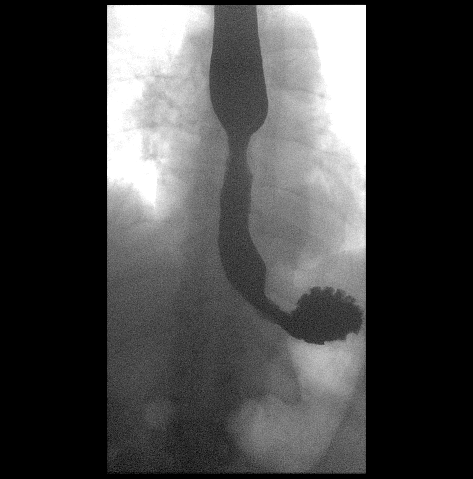
[frame 31/31]
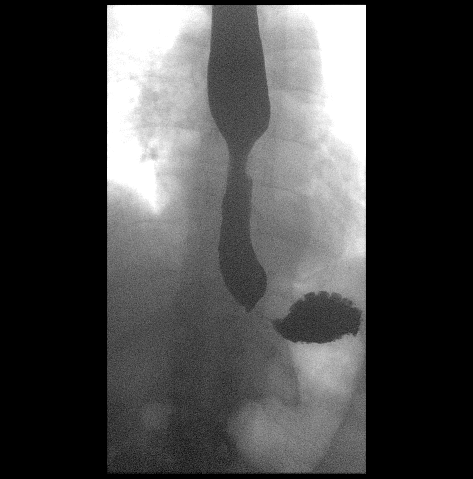

[Series 11: fluoro_barium 2fps_bw · 0.18mm/px · 1 of 1 slices shown (1 of 2)]
[im 1/1]
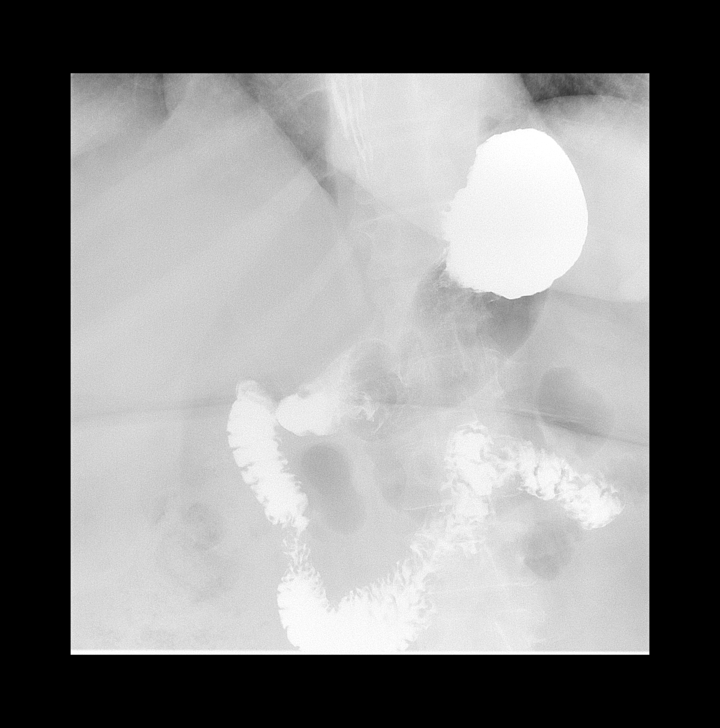

[Series 13: fluoro_barium 2fps_bw · 0.19mm/px · 1 of 1 slices shown (2 of 2)]
[im 1/1]
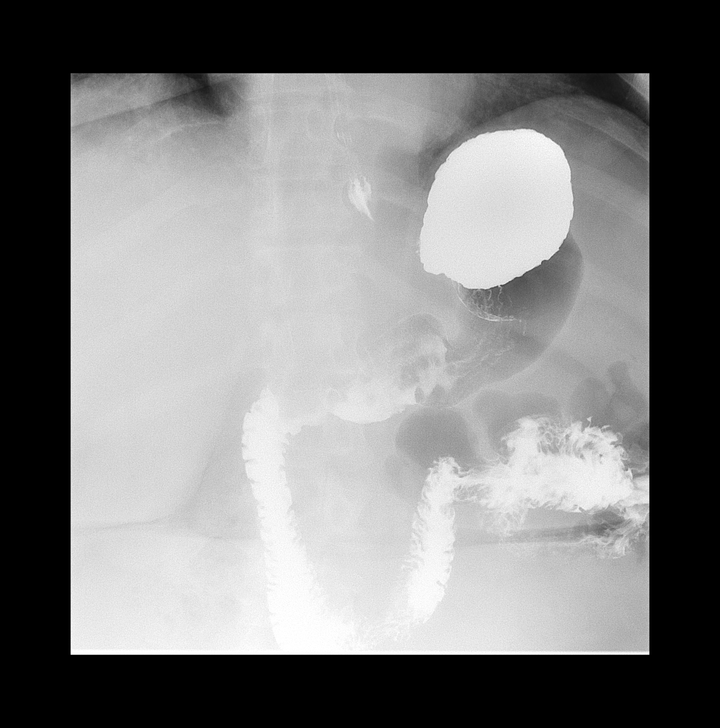

[Series 14: cp_standard · 0.28mm/px · 1 of 1 slices shown (5 of 5)]
[im 1/1]
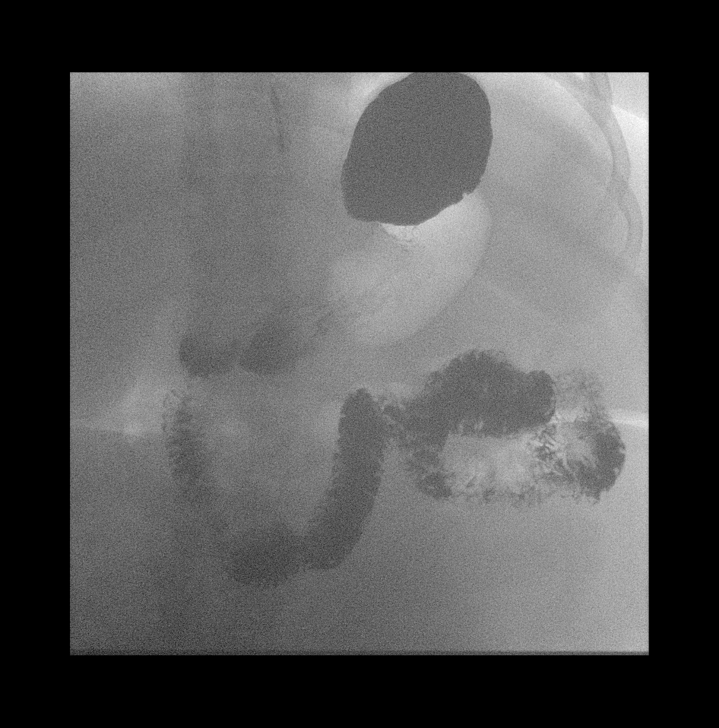

[Series 17: t abdomen supine · 0.15mm/px · 1 of 1 slices shown (3 of 3)]
[im 1/1]
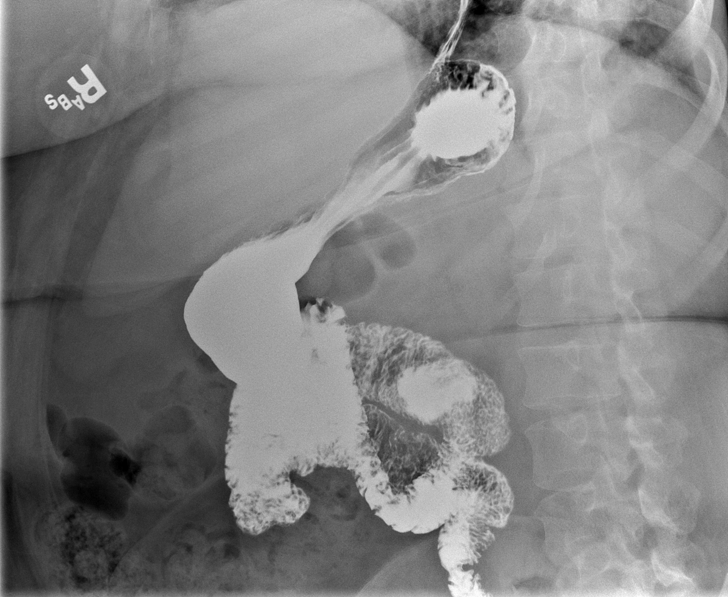

[12 of 19 positions shown; findings below may reference images not displayed]

FINDINGS: KUB:

There is no bowel dilatation to suggest obstruction. There is no
evidence of pneumoperitoneum, portal venous gas or pneumatosis.

There are no pathologic calcifications along the expected course of
the ureters.

The osseous structures are unremarkable.

UPPER GI SERIES:

Limited evaluation secondary to limited patient mobility in the
upright and supine positions.

Examination of the esophagus demonstrated normal esophageal
motility. Normal esophageal morphology without evidence of
esophagitis or ulceration. No esophageal stricture, diverticula, or
mass lesion. No evidence of hiatal hernia. Mild gastroesophageal
reflux.

Examination of the stomach demonstrated normal rugal folds and areae
gastricae. The gastric mucosa appeared unremarkable without evidence
of ulceration, scarring, or mass lesion. Gastric motility and
emptying was normal. Fluoroscopic examination of the duodenum
demonstrates normal motility and morphology without evidence of
ulceration or mass lesion.
IMPRESSION: 1. Mild gastroesophageal reflux.
2. Otherwise normal upper GI.

## 2020-07-31 DIAGNOSIS — H4311 Vitreous hemorrhage, right eye: Secondary | ICD-10-CM | POA: Diagnosis not present

## 2020-07-31 DIAGNOSIS — Z961 Presence of intraocular lens: Secondary | ICD-10-CM | POA: Diagnosis not present

## 2020-07-31 DIAGNOSIS — H47239 Glaucomatous optic atrophy, unspecified eye: Secondary | ICD-10-CM | POA: Diagnosis not present

## 2020-07-31 DIAGNOSIS — E113213 Type 2 diabetes mellitus with mild nonproliferative diabetic retinopathy with macular edema, bilateral: Secondary | ICD-10-CM | POA: Diagnosis not present

## 2020-09-06 ENCOUNTER — Encounter (HOSPITAL_COMMUNITY): Payer: Self-pay | Admitting: *Deleted

## 2020-10-30 DIAGNOSIS — E113311 Type 2 diabetes mellitus with moderate nonproliferative diabetic retinopathy with macular edema, right eye: Secondary | ICD-10-CM | POA: Diagnosis not present

## 2020-10-30 DIAGNOSIS — Z961 Presence of intraocular lens: Secondary | ICD-10-CM | POA: Diagnosis not present

## 2020-10-30 DIAGNOSIS — H40049 Steroid responder, unspecified eye: Secondary | ICD-10-CM | POA: Diagnosis not present

## 2020-10-30 DIAGNOSIS — E113392 Type 2 diabetes mellitus with moderate nonproliferative diabetic retinopathy without macular edema, left eye: Secondary | ICD-10-CM | POA: Diagnosis not present

## 2020-11-01 DIAGNOSIS — E113319 Type 2 diabetes mellitus with moderate nonproliferative diabetic retinopathy with macular edema, unspecified eye: Secondary | ICD-10-CM | POA: Diagnosis not present

## 2020-11-01 DIAGNOSIS — H4063X3 Glaucoma secondary to drugs, bilateral, severe stage: Secondary | ICD-10-CM | POA: Diagnosis not present

## 2020-11-01 DIAGNOSIS — T380X5D Adverse effect of glucocorticoids and synthetic analogues, subsequent encounter: Secondary | ICD-10-CM | POA: Diagnosis not present

## 2020-12-11 DIAGNOSIS — I10 Essential (primary) hypertension: Secondary | ICD-10-CM | POA: Diagnosis not present

## 2020-12-11 DIAGNOSIS — L97229 Non-pressure chronic ulcer of left calf with unspecified severity: Secondary | ICD-10-CM | POA: Diagnosis not present

## 2020-12-11 DIAGNOSIS — Z23 Encounter for immunization: Secondary | ICD-10-CM | POA: Diagnosis not present

## 2020-12-11 DIAGNOSIS — M109 Gout, unspecified: Secondary | ICD-10-CM | POA: Diagnosis not present

## 2020-12-11 DIAGNOSIS — E1169 Type 2 diabetes mellitus with other specified complication: Secondary | ICD-10-CM | POA: Diagnosis not present

## 2020-12-19 DIAGNOSIS — I10 Essential (primary) hypertension: Secondary | ICD-10-CM | POA: Diagnosis not present

## 2020-12-19 DIAGNOSIS — G43009 Migraine without aura, not intractable, without status migrainosus: Secondary | ICD-10-CM | POA: Diagnosis not present

## 2020-12-19 DIAGNOSIS — M19019 Primary osteoarthritis, unspecified shoulder: Secondary | ICD-10-CM | POA: Diagnosis not present

## 2020-12-19 DIAGNOSIS — M25519 Pain in unspecified shoulder: Secondary | ICD-10-CM | POA: Diagnosis not present

## 2020-12-19 DIAGNOSIS — E1169 Type 2 diabetes mellitus with other specified complication: Secondary | ICD-10-CM | POA: Diagnosis not present

## 2020-12-19 DIAGNOSIS — M109 Gout, unspecified: Secondary | ICD-10-CM | POA: Diagnosis not present

## 2020-12-19 DIAGNOSIS — E118 Type 2 diabetes mellitus with unspecified complications: Secondary | ICD-10-CM | POA: Diagnosis not present

## 2020-12-19 DIAGNOSIS — L97229 Non-pressure chronic ulcer of left calf with unspecified severity: Secondary | ICD-10-CM | POA: Diagnosis not present

## 2021-01-05 ENCOUNTER — Other Ambulatory Visit: Payer: Self-pay

## 2021-01-05 ENCOUNTER — Encounter: Payer: Self-pay | Admitting: Internal Medicine

## 2021-01-05 ENCOUNTER — Inpatient Hospital Stay
Admission: EM | Admit: 2021-01-05 | Discharge: 2021-01-09 | DRG: 683 | Disposition: A | Discharge status: Home Health | Payer: Medicare HMO | Attending: Family Medicine | Admitting: Family Medicine

## 2021-01-05 DIAGNOSIS — Z20822 Contact with and (suspected) exposure to covid-19: Secondary | ICD-10-CM | POA: Diagnosis present

## 2021-01-05 DIAGNOSIS — R0902 Hypoxemia: Secondary | ICD-10-CM | POA: Diagnosis not present

## 2021-01-05 DIAGNOSIS — Z6841 Body Mass Index (BMI) 40.0 and over, adult: Secondary | ICD-10-CM

## 2021-01-05 DIAGNOSIS — I83018 Varicose veins of right lower extremity with ulcer other part of lower leg: Secondary | ICD-10-CM | POA: Diagnosis present

## 2021-01-05 DIAGNOSIS — N179 Acute kidney failure, unspecified: Secondary | ICD-10-CM | POA: Diagnosis not present

## 2021-01-05 DIAGNOSIS — R52 Pain, unspecified: Secondary | ICD-10-CM | POA: Diagnosis not present

## 2021-01-05 DIAGNOSIS — D649 Anemia, unspecified: Secondary | ICD-10-CM | POA: Diagnosis not present

## 2021-01-05 DIAGNOSIS — I83028 Varicose veins of left lower extremity with ulcer other part of lower leg: Secondary | ICD-10-CM | POA: Diagnosis present

## 2021-01-05 DIAGNOSIS — L97311 Non-pressure chronic ulcer of right ankle limited to breakdown of skin: Secondary | ICD-10-CM | POA: Diagnosis not present

## 2021-01-05 DIAGNOSIS — E875 Hyperkalemia: Secondary | ICD-10-CM | POA: Diagnosis not present

## 2021-01-05 DIAGNOSIS — E11649 Type 2 diabetes mellitus with hypoglycemia without coma: Secondary | ICD-10-CM | POA: Diagnosis not present

## 2021-01-05 DIAGNOSIS — E876 Hypokalemia: Secondary | ICD-10-CM | POA: Diagnosis not present

## 2021-01-05 DIAGNOSIS — I129 Hypertensive chronic kidney disease with stage 1 through stage 4 chronic kidney disease, or unspecified chronic kidney disease: Secondary | ICD-10-CM | POA: Diagnosis present

## 2021-01-05 DIAGNOSIS — Z880 Allergy status to penicillin: Secondary | ICD-10-CM

## 2021-01-05 DIAGNOSIS — I10 Essential (primary) hypertension: Secondary | ICD-10-CM | POA: Diagnosis not present

## 2021-01-05 DIAGNOSIS — E872 Acidosis, unspecified: Secondary | ICD-10-CM | POA: Diagnosis present

## 2021-01-05 DIAGNOSIS — L97329 Non-pressure chronic ulcer of left ankle with unspecified severity: Secondary | ICD-10-CM | POA: Diagnosis present

## 2021-01-05 DIAGNOSIS — L97919 Non-pressure chronic ulcer of unspecified part of right lower leg with unspecified severity: Secondary | ICD-10-CM | POA: Diagnosis not present

## 2021-01-05 DIAGNOSIS — M109 Gout, unspecified: Secondary | ICD-10-CM | POA: Diagnosis present

## 2021-01-05 DIAGNOSIS — I878 Other specified disorders of veins: Secondary | ICD-10-CM

## 2021-01-05 DIAGNOSIS — G43909 Migraine, unspecified, not intractable, without status migrainosus: Secondary | ICD-10-CM | POA: Diagnosis present

## 2021-01-05 DIAGNOSIS — E1151 Type 2 diabetes mellitus with diabetic peripheral angiopathy without gangrene: Secondary | ICD-10-CM | POA: Diagnosis present

## 2021-01-05 DIAGNOSIS — R001 Bradycardia, unspecified: Secondary | ICD-10-CM | POA: Diagnosis present

## 2021-01-05 DIAGNOSIS — R200 Anesthesia of skin: Secondary | ICD-10-CM | POA: Diagnosis present

## 2021-01-05 DIAGNOSIS — Z833 Family history of diabetes mellitus: Secondary | ICD-10-CM

## 2021-01-05 DIAGNOSIS — N1832 Chronic kidney disease, stage 3b: Secondary | ICD-10-CM | POA: Diagnosis present

## 2021-01-05 DIAGNOSIS — R600 Localized edema: Secondary | ICD-10-CM | POA: Diagnosis present

## 2021-01-05 DIAGNOSIS — Z743 Need for continuous supervision: Secondary | ICD-10-CM | POA: Diagnosis not present

## 2021-01-05 DIAGNOSIS — I2510 Atherosclerotic heart disease of native coronary artery without angina pectoris: Secondary | ICD-10-CM | POA: Diagnosis present

## 2021-01-05 DIAGNOSIS — Z79899 Other long term (current) drug therapy: Secondary | ICD-10-CM

## 2021-01-05 DIAGNOSIS — Z9884 Bariatric surgery status: Secondary | ICD-10-CM

## 2021-01-05 DIAGNOSIS — L97319 Non-pressure chronic ulcer of right ankle with unspecified severity: Secondary | ICD-10-CM | POA: Diagnosis present

## 2021-01-05 DIAGNOSIS — E1122 Type 2 diabetes mellitus with diabetic chronic kidney disease: Secondary | ICD-10-CM | POA: Diagnosis present

## 2021-01-05 DIAGNOSIS — Z791 Long term (current) use of non-steroidal anti-inflammatories (NSAID): Secondary | ICD-10-CM

## 2021-01-05 DIAGNOSIS — Z888 Allergy status to other drugs, medicaments and biological substances status: Secondary | ICD-10-CM

## 2021-01-05 DIAGNOSIS — R609 Edema, unspecified: Secondary | ICD-10-CM | POA: Diagnosis not present

## 2021-01-05 HISTORY — DX: Hyperkalemia: E87.5

## 2021-01-05 LAB — COMPREHENSIVE METABOLIC PANEL
ALT: 17 U/L (ref 0–44)
AST: 22 U/L (ref 15–41)
Albumin: 3.7 g/dL (ref 3.5–5.0)
Alkaline Phosphatase: 45 U/L (ref 38–126)
Anion gap: 3 — ABNORMAL LOW (ref 5–15)
BUN: 43 mg/dL — ABNORMAL HIGH (ref 8–23)
CO2: 16 mmol/L — ABNORMAL LOW (ref 22–32)
Calcium: 9.3 mg/dL (ref 8.9–10.3)
Chloride: 119 mmol/L — ABNORMAL HIGH (ref 98–111)
Creatinine, Ser: 2.11 mg/dL — ABNORMAL HIGH (ref 0.44–1.00)
GFR, Estimated: 26 mL/min — ABNORMAL LOW (ref >60)
Glucose, Bld: 100 mg/dL — ABNORMAL HIGH (ref 70–99)
Potassium: 6.7 mmol/L (ref 3.5–5.1)
Sodium: 138 mmol/L (ref 135–145)
Total Bilirubin: 0.7 mg/dL (ref 0.3–1.2)
Total Protein: 8.1 g/dL (ref 6.5–8.1)

## 2021-01-05 LAB — CBC WITH DIFFERENTIAL/PLATELET
Abs Immature Granulocytes: 0.01 10*3/uL (ref 0.00–0.07)
Basophils Absolute: 0 10*3/uL (ref 0.0–0.1)
Basophils Relative: 1 %
Eosinophils Absolute: 0.3 10*3/uL (ref 0.0–0.5)
Eosinophils Relative: 5 %
HCT: 26.5 % — ABNORMAL LOW (ref 36.0–46.0)
Hemoglobin: 7.9 g/dL — ABNORMAL LOW (ref 12.0–15.0)
Immature Granulocytes: 0 %
Lymphocytes Relative: 21 %
Lymphs Abs: 1.3 10*3/uL (ref 0.7–4.0)
MCH: 24.8 pg — ABNORMAL LOW (ref 26.0–34.0)
MCHC: 29.8 g/dL — ABNORMAL LOW (ref 30.0–36.0)
MCV: 83.1 fL (ref 80.0–100.0)
Monocytes Absolute: 0.4 10*3/uL (ref 0.1–1.0)
Monocytes Relative: 7 %
Neutro Abs: 4.2 10*3/uL (ref 1.7–7.7)
Neutrophils Relative %: 66 %
Platelets: 198 10*3/uL (ref 150–400)
RBC: 3.19 MIL/uL — ABNORMAL LOW (ref 3.87–5.11)
RDW: 16.9 % — ABNORMAL HIGH (ref 11.5–15.5)
WBC: 6.2 10*3/uL (ref 4.0–10.5)
nRBC: 0 % (ref 0.0–0.2)

## 2021-01-05 LAB — BASIC METABOLIC PANEL
Anion gap: 3 — ABNORMAL LOW (ref 5–15)
BUN: 41 mg/dL — ABNORMAL HIGH (ref 8–23)
CO2: 15 mmol/L — ABNORMAL LOW (ref 22–32)
Calcium: 9.7 mg/dL (ref 8.9–10.3)
Chloride: 120 mmol/L — ABNORMAL HIGH (ref 98–111)
Creatinine, Ser: 2.05 mg/dL — ABNORMAL HIGH (ref 0.44–1.00)
GFR, Estimated: 27 mL/min — ABNORMAL LOW (ref >60)
Glucose, Bld: 49 mg/dL — ABNORMAL LOW (ref 70–99)
Potassium: 6.2 mmol/L — ABNORMAL HIGH (ref 3.5–5.1)
Sodium: 138 mmol/L (ref 135–145)

## 2021-01-05 LAB — RESP PANEL BY RT-PCR (FLU A&B, COVID) ARPGX2
Influenza A by PCR: NEGATIVE
Influenza B by PCR: NEGATIVE
SARS Coronavirus 2 by RT PCR: NEGATIVE

## 2021-01-05 LAB — TSH: TSH: 2.023 u[IU]/mL (ref 0.350–4.500)

## 2021-01-05 LAB — TROPONIN I (HIGH SENSITIVITY): Troponin I (High Sensitivity): 32 ng/L — ABNORMAL HIGH (ref <18)

## 2021-01-05 LAB — GLUCOSE, CAPILLARY
Glucose-Capillary: 121 mg/dL — ABNORMAL HIGH (ref 70–99)
Glucose-Capillary: 63 mg/dL — ABNORMAL LOW (ref 70–99)
Glucose-Capillary: 104 mg/dL — ABNORMAL HIGH (ref 70–99)

## 2021-01-05 LAB — MAGNESIUM: Magnesium: 1.9 mg/dL (ref 1.7–2.4)

## 2021-01-05 LAB — CK: Total CK: 413 U/L — ABNORMAL HIGH (ref 38–234)

## 2021-01-05 LAB — PHOSPHORUS: Phosphorus: 3.7 mg/dL (ref 2.5–4.6)

## 2021-01-05 MED ORDER — TOPIRAMATE 100 MG PO TABS
100.0000 mg | ORAL_TABLET | Freq: Every day | ORAL | Status: DC
  Administered 2021-01-06: 100 mg via ORAL
  Filled 2021-01-05: qty 1

## 2021-01-05 MED ORDER — FUROSEMIDE 10 MG/ML IJ SOLN
40.0000 mg | Freq: Once | INTRAMUSCULAR | Status: AC
  Administered 2021-01-05: 40 mg via INTRAVENOUS
  Filled 2021-01-05: qty 4

## 2021-01-05 MED ORDER — INSULIN ASPART 100 UNIT/ML IJ SOLN: 0.0000 [IU] | Freq: Three times a day (TID) | INTRAMUSCULAR | Status: DC

## 2021-01-05 MED ORDER — DEXTROSE 50 % IV SOLN
1.0000 | Freq: Once | INTRAVENOUS | Status: AC
  Administered 2021-01-05: 50 mL via INTRAVENOUS
  Filled 2021-01-05: qty 50

## 2021-01-05 MED ORDER — SODIUM BICARBONATE 8.4 % IV SOLN
50.0000 meq | Freq: Once | INTRAVENOUS | Status: AC
  Administered 2021-01-05: 50 meq via INTRAVENOUS
  Filled 2021-01-05: qty 50

## 2021-01-05 MED ORDER — CALCIUM GLUCONATE-NACL 1-0.675 GM/50ML-% IV SOLN
1.0000 g | Freq: Once | INTRAVENOUS | Status: AC
  Administered 2021-01-05: 1000 mg via INTRAVENOUS
  Filled 2021-01-05: qty 50

## 2021-01-05 MED ORDER — ACETAMINOPHEN 500 MG PO TABS
1000.0000 mg | ORAL_TABLET | Freq: Four times a day (QID) | ORAL | Status: DC | PRN
  Administered 2021-01-06 – 2021-01-08 (×4): 1000 mg via ORAL
  Filled 2021-01-05 (×4): qty 2

## 2021-01-05 MED ORDER — INSULIN ASPART 100 UNIT/ML IJ SOLN
10.0000 [IU] | Freq: Once | INTRAMUSCULAR | Status: AC
  Administered 2021-01-05: 10 [IU] via INTRAVENOUS
  Filled 2021-01-05: qty 1

## 2021-01-05 MED ORDER — SODIUM POLYSTYRENE SULFONATE 15 GM/60ML PO SUSP
30.0000 g | Freq: Once | ORAL | Status: AC
  Administered 2021-01-05: 30 g via ORAL
  Filled 2021-01-05: qty 120

## 2021-01-05 MED ORDER — HEPARIN SODIUM (PORCINE) 5000 UNIT/ML IJ SOLN
5000.0000 [IU] | Freq: Three times a day (TID) | INTRAMUSCULAR | Status: DC
  Administered 2021-01-06 (×2): 5000 [IU] via SUBCUTANEOUS
  Filled 2021-01-05 (×2): qty 1

## 2021-01-05 MED ORDER — ROSUVASTATIN CALCIUM 10 MG PO TABS: 10.0000 mg | ORAL_TABLET | Freq: Every evening | ORAL | Status: DC

## 2021-01-05 MED ORDER — SODIUM ZIRCONIUM CYCLOSILICATE 10 G PO PACK
10.0000 g | PACK | Freq: Once | ORAL | Status: AC
  Administered 2021-01-05: 10 g via ORAL
  Filled 2021-01-05: qty 1

## 2021-01-05 MED ORDER — INSULIN ASPART 100 UNIT/ML IV SOLN
5.0000 [IU] | Freq: Once | INTRAVENOUS | Status: DC
  Filled 2021-01-05: qty 0.05

## 2021-01-05 NOTE — ED Triage Notes (Signed)
Pt comes into the ED via EMS from home with c/o wound to the RLE that started to form today with a hx of the same, a/ox4   99.9 135/79 65 100%RA

## 2021-01-05 NOTE — Plan of Care (Signed)
65 y/o who is wheel chair bound who presented with Right sided leg pain along with new onset right ulcer with her chronic LE edema.  H/o gout on allopurinol, migraines on aimovig, vit d and calcium, and timolol eye drop and dorzolamide eye drop.  At bedside, pt bradycardia to 20-30's with progressed PR interval and wondered if from a vagal episode,  not classic hyperkalemia findings of widened qrs.  She has been completely asymptomatic the whole time.  Her pcp is [facility], seen on 10/27  Pt has pads on currently and asked nurse to put another dose of calcium on.  Awaiting BMP to come back.   HTN on lis/hctz 20/25 total and metoprolol 25mg  BID Bradycardia, bradyed to 30's, asymptomatic the entire time, PR interval, will go ahead and consult cardiology. --hold beta blocker for now --VBG --will preemptively temporize again while consulting cardiology. --keep on telemetry, will go to step down unit for now.

## 2021-01-05 NOTE — H&P (Signed)
History and Physical    Veronica Krueger FIE:[id number] DOB: 08-29-1955 DOA: 01/05/2021  PCP: Mechele Claude, FNP   Patient coming from: home Chief Complaint: right leg pain, new ulcer  HPI: Veronica Krueger is a 65 y.o. female with a pertinent history of  Veronica Krueger is a 65 y.o. female who presents for an ulcer to the right lateral ankle that is nonhealing from home by EMS to [facility] ED.    She presented initially because of new right hip pain which her provider did diclofenac.  new ulceration to right lateral ankle, has chronic wound on left lateral ankle.  Has been on antibiotics for the last 20ish days, augmentin bactrim and now on doxycycline and now has worsening kidney function.  has a referral for wound care in the outpatient but hasn't been yet. States might have been trying out some NSAIDs, started diclofenac bid on 11/10 Pt has history of hyperkalemia, as of 2 years ago  137/64, 98%, 65 HR, RR 20, WBC 6.2, Hgb 7.9, PLT 198, NA 138, K6.7, CO2 16, creatinine 2.11 EKG with some peakin gof the twaves. Bmp improved to 6.2 potassium ED talked with nephrology. I spoke with Veronica Krueger for reassurance and felt that   Review of Systems: As per HPI otherwise 10 point review of systems negative.  Other pertinents as below:  General - denies fevers or chills, denies recent illness HEENT - denies new HA or visual changes Cardio - denies palpitations, cp Resp - denies presyncope, sob or cough GI - denies n/v/d/GI pain GU - denies urinary complaints or dysuria MSK - has weakness in left arm, has LE edema Skin - has ulcers on left and right Neuro - denies new numbness or weknaess Psych -  denies anxiety or depression  Past Medical History:  Diagnosis Date   Anemia    Diabetes mellitus without complication ([place])    type 2    Hypertension    Left hand weakness    related to previous stomach abscess surgery    Lymph edema    legs    Migraine     Past Surgical  History:  Procedure Laterality Date   bilateral cataract surgery      history of stomach surgery      due to left sided abscess of stomach - 2010    LAPAROSCOPIC GASTRIC SLEEVE RESECTION N/A 02/08/2018   Procedure: LAPAROSCOPIC GASTRIC SLEEVE RESECTION WITH HIATAL HERNIA REPAIR AND UPPER ENDO, ERAS Pathway;  Surgeon: Johnathan Hausen, MD;  Location: [facility] ORS;  Service: General;  Laterality: N/A;     reports that she has never smoked. She has never used smokeless tobacco. She reports that she does not drink alcohol and does not use drugs.  Allergies  Allergen Reactions   Penicillins Itching, Other (See Comments) and Swelling    Has patient had a PCN reaction causing immediate rash, facial/tongue/throat swelling, SOB or lightheadedness with hypotension:  Has patient had a PCN reaction causing severe rash involving mucus membranes or skin necrosis:  Has patient had a PCN reaction that required hospitalization: Has patient had a PCN reaction occurring within the last 10 years: No If all of the above answers are "NO", then may proceed with Cephalosporin use.  Other reaction(s): Other (See Comments), Other (See Comments) Has patient had a PCN reaction causing immediate rash, facial/tongue/throat swelling, SOB or lightheadedness with hypotension:  Has patient had a PCN reaction causing severe rash involving mucus membranes or skin necrosis:  Has patient had  a PCN reaction that required hospitalization: Has patient had a PCN reaction occurring within the last 10 years: No If all of the above answers are "NO", then may proceed with Cephalosporin use. Has patient had a PCN reaction causing immediate rash, facial/tongue/throat swelling, SOB or lightheadedness with hypotension:  Has patient had a PCN reaction causing severe rash involving mucus membranes or skin necrosis:  Has patient had a PCN reaction that required hospitalization: Has patient had a PCN reaction occurring within the last 10 years:  No If all of the above answers are "NO", then may proceed with Cephalosporin use.   Gabapentin Other (See Comments) and Itching    Hair loss  Hair loss Other reaction(s): Other (See Comments), Other (See Comments), Other (See Comments) Hair loss Hair loss Hair loss Hair loss Hair loss Hair loss Hair loss Hair loss Hair loss Hair loss     Family History  Problem Relation Age of Onset   Diabetes Sister      Prior to Admission medications   Medication Sig Start Date End Date Taking? Authorizing Provider  allopurinol (ZYLOPRIM) 100 MG tablet Take 100 mg by mouth daily. 03/17/18  Yes [provider]  celecoxib (CELEBREX) 200 MG capsule Take 200 mg by mouth 2 (two) times daily. 12/19/20  Yes [provider]  diclofenac (VOLTAREN) 75 MG EC tablet Take 75 mg by mouth 2 (two) times daily. 01/03/21  Yes [provider]  dorzolamide (TRUSOPT) 2 % ophthalmic solution Place 1 drop into the right eye 2 (two) times daily.  07/20/17  Yes [provider]  doxycycline (VIBRA-TABS) 100 MG tablet Take 100 mg by mouth 2 (two) times daily. 01/02/21  Yes [provider]  ferrous sulfate 325 (65 FE) MG tablet Take 325 mg by mouth daily. 12/11/20  Yes [provider]  latanoprost (XALATAN) 0.005 % ophthalmic solution Place 1 drop into the right eye at bedtime.  06/21/17  Yes [provider]  lisinopril-hydrochlorothiazide (PRINZIDE,ZESTORETIC) 10-12.5 MG tablet Take 1 tablet by mouth 2 (two) times daily. 07/11/17  Yes [provider]  meloxicam (MOBIC) 15 MG tablet Take 15 mg by mouth daily. 09/14/20  Yes [provider]  metoprolol tartrate (LOPRESSOR) 25 MG tablet Take 25 mg by mouth 2 (two) times daily.   Yes [provider]  mupirocin ointment (BACTROBAN) 2 % Apply 1 application topically 2 (two) times daily. 01/02/21  Yes [provider]  rosuvastatin (CRESTOR) 10 MG tablet Take 10 mg by mouth every  evening. 01/01/21  Yes [provider]  timolol (TIMOPTIC) 0.5 % ophthalmic solution Place 1 drop into both eyes 2 (two) times daily.  07/08/17  Yes [provider]  topiramate (TOPAMAX) 100 MG tablet Take 100 mg by mouth at bedtime. 10/25/20  Yes [provider]  Vitamin D, Ergocalciferol, (DRISDOL) 1.25 MG (50000 UT) CAPS capsule Take 50,000 Units by mouth once a week. 03/17/18  Yes [provider]  acetaminophen (TYLENOL) 500 MG tablet Take 1,000 mg by mouth every 6 (six) hours as needed for moderate pain or headache.     [provider]    Physical Exam: Vitals:   01/05/21 1811 01/05/21 1812 01/05/21 1830 01/05/21 1936  BP:   132/67 (!) 118/95  Pulse: (!) 28 (!) 47 (!) 58 66  Resp: 13 15 14  (!) 22  Temp:    97.6 F (36.4 C)  TempSrc:    Oral  SpO2: 100% 100% 100% 100%  Weight:  Height:        Constitutional: NAD, comfortable, severely obese Eyes: pupils equal and reactive to light, anicteric, without injection ENMT: MMM, throat without exudates or erythema Neck: normal, supple, no masses, no thyromegaly noted Respiratory: CTAB, nwob, no bibasilar rales  Cardiovascular: rrr w/o mrg, warm extremities Abdomen: NBS, NT,   Musculoskeletal: moving all 4 extremities, strength grossly intact 5/5 in the UE and LE's, DTR's +4 LE Edema, with signs of venous stasis dermatitis, L >R as per pictures  Skin: no rashes, lesions, ulcers. No induration Neurologic: CN 2-12 grossly intact. Sensation intact Psychiatric: AO appearing, mentation appropriate     Labs on Admission: I have personally reviewed following labs and imaging studies  CBC: Recent Labs  Lab 01/05/21 1400  WBC 6.2  NEUTROABS 4.2  HGB 7.9*  HCT 26.5*  MCV 83.1  PLT 017   Basic Metabolic Panel: Recent Labs  Lab 01/05/21 1400 01/05/21 1800  NA 138 138  K 6.7* 6.2*  CL 119* 120*  CO2 16* 15*  GLUCOSE 100* 49*  BUN 43* 41*  CREATININE 2.11* 2.05*  CALCIUM 9.3 9.7    GFR: Estimated Creatinine Clearance: 45.8 mL/min (A) (by C-G formula based on SCr of 2.05 mg/dL (H)). Liver Function Tests: Recent Labs  Lab 01/05/21 1400  AST 22  ALT 17  ALKPHOS 45  BILITOT 0.7  PROT 8.1  ALBUMIN 3.7   No results for input(s): LIPASE, AMYLASE in the last 168 hours. No results for input(s): AMMONIA in the last 168 hours. Coagulation Profile: No results for input(s): INR, PROTIME in the last 168 hours. Cardiac Enzymes: No results for input(s): CKTOTAL, CKMB, CKMBINDEX, TROPONINI in the last 168 hours. BNP (last 3 results) No results for input(s): PROBNP in the last 8760 hours. HbA1C: No results for input(s): HGBA1C in the last 72 hours. CBG: No results for input(s): GLUCAP in the last 168 hours. Lipid Profile: No results for input(s): CHOL, HDL, LDLCALC, TRIG, CHOLHDL, LDLDIRECT in the last 72 hours. Thyroid Function Tests: No results for input(s): TSH, T4TOTAL, FREET4, T3FREE, THYROIDAB in the last 72 hours. Anemia Panel: No results for input(s): VITAMINB12, FOLATE, FERRITIN, TIBC, IRON, RETICCTPCT in the last 72 hours. Urine analysis:    Component Value Date/Time   COLORURINE YELLOW (A) 08/10/2018 1558   APPEARANCEUR CLEAR (A) 08/10/2018 1558   APPEARANCEUR Hazy 03/05/2013 1707   LABSPEC 1.009 08/10/2018 1558   LABSPEC 1.010 03/05/2013 1707   PHURINE 6.0 08/10/2018 1558   GLUCOSEU NEGATIVE 08/10/2018 1558   GLUCOSEU 50 mg/dL 03/05/2013 1707   HGBUR NEGATIVE 08/10/2018 1558   BILIRUBINUR NEGATIVE 08/10/2018 1558   BILIRUBINUR Negative 03/05/2013 1707   KETONESUR NEGATIVE 08/10/2018 1558   PROTEINUR NEGATIVE 08/10/2018 1558   NITRITE NEGATIVE 08/10/2018 1558   LEUKOCYTESUR NEGATIVE 08/10/2018 1558   LEUKOCYTESUR Negative 03/05/2013 1707    Radiological Exams on Admission: No results found.  EKG: Independently reviewed.   Assessment/Plan Active Problems:   Hyperkalemia   Hyperkalemia-temporized in the emergency department, repeat blood  work soon, suspect from AKI in the setting of ACE inhibitor, history of elevated potassiums in the past, telemetry, EKG with some peaked T waves.  Possibly from bactrim even though last dose was 3-4 days ago.  Also in setting of NSAID diclofenac --renal diet, hold ACE/hctz, will give lasix instead --Denies any constipation so will give kayexalate as well. S/p lokelma --really encouraged compression stockings but she thinks with left hand weakness it is very hard to get these on.  Consider  unna boots and need to get set up for wound management in the outpatient but she states wound care won't see new patients after 2 pm when her ride is able to take her.  I mentioned other services.  Acute on CKD - maybe 1.6-1.8 baseline from 2 years ago and up to 2.1 here  New onset bradycardia int he ED to 28, pr prolongation and was looking closely for mobitz or wernicke's but none there.  L>R wound on lateral ankle, POA, suspect venous insufficiency ulcer and will hold abx for now LE edema - lasix as above --wound management --consider ABI's if not one before --ED sent for wound culture  None anion gap metabolic acidosis, will ctm Suspect AKI to 2.11 from 1.77 to 1.52 years ago Severe obesity - encouraged weight loss Normocytic anemia DM - SSI, not on orals outpatient.  Patient and/or Family completely agreed with the plan, expressed understanding and I answered all questions.  DVT prophylaxis: Heparin SQ Code Status: Full code Family Communication: n/a Disposition Plan: home probably with hh to help with wound care, consider SNF I guess Consults called: cardiology Admission status: inpatient given complexity and new onset bradycardia requiring stay in stepdown       A total of 80 minutes utilized during this admission.  [facility]   If 7PM-7AM, please contact night-coverage [email or web address] Password [facility]  01/05/2021, 7:38 PM

## 2021-01-05 NOTE — ED Notes (Signed)
Pt HR dropped to 28, EKG done and pt placed on pads. EDP to bedside. Pt denies any complaints at this time.

## 2021-01-05 NOTE — Plan of Care (Signed)
Spoke with Dr. Serafina Royals who reassured me.  Said for nurses or providers to text page him with any concerns tonight.  On calcium gluconate, and asked nurse to finish hyperkalemia temporizing measures before bring up to stepdown.  On calcium gluconate, HR's returned to 60's Suspect Type 1 Bradycardia with pr prolongation similar to 2020, was looking closely for type 2.  We agree from potassium and will watch closely in stepdown, following up on other electrolytes.

## 2021-01-05 NOTE — Consult Note (Signed)
[facility] CONSULT NOTE    Date: 01/05/2021                  Patient Name:  Veronica Krueger  MRN: [id number]  DOB: Aug 30, 1955  Age / Sex: 65 y.o., female         PCP: Mechele Claude, FNP                 Service Requesting Consult: [place]                  Reason for Consult: AKI and HYPERKALEMIA.            History of Present Illness: Patient is a 65 y.o. female with a PMHx of diabetes, peripheral vascular disease, nonhealing leg ulcers, hypertension, coronary artery disease, obesity, s/p gastric bypass surgery, chronic kidney disease stage IIIb with anemia and hyperkalemia now admitted with history of nonhealing right leg ulcers.  She had been on antibiotics at home.  She also has been taking nonsteroidal anti-inflammatory drugs at home.  Patient also has been on ACE inhibitor's.  Patient was found to be bradycardic and a potassium of 6.7.  She had hyperkalemia protocol including calcium, sodium bicarbonate, insulin and dextrose.  Patient is asymptomatic and denies any chest pain, shortness of breath or orthopnea.   Medications: Outpatient medications: Medications Prior to Admission  Medication Sig Dispense Refill Last Dose   allopurinol (ZYLOPRIM) 100 MG tablet Take 100 mg by mouth daily.   01/05/2021   celecoxib (CELEBREX) 200 MG capsule Take 200 mg by mouth 2 (two) times daily.   01/05/2021   diclofenac (VOLTAREN) 75 MG EC tablet Take 75 mg by mouth 2 (two) times daily.   01/04/2021   dorzolamide (TRUSOPT) 2 % ophthalmic solution Place 1 drop into the right eye 2 (two) times daily.   99 01/05/2021   doxycycline (VIBRA-TABS) 100 MG tablet Take 100 mg by mouth 2 (two) times daily.   01/04/2021   ferrous sulfate 325 (65 FE) MG tablet Take 325 mg by mouth daily.   01/05/2021   latanoprost (XALATAN) 0.005 % ophthalmic solution Place 1 drop into the right eye at bedtime.    01/04/2021   lisinopril-hydrochlorothiazide (PRINZIDE,ZESTORETIC) 10-12.5 MG  tablet Take 1 tablet by mouth 2 (two) times daily.  3 01/05/2021   meloxicam (MOBIC) 15 MG tablet Take 15 mg by mouth daily.   01/05/2021   metoprolol tartrate (LOPRESSOR) 25 MG tablet Take 25 mg by mouth 2 (two) times daily.   01/05/2021   mupirocin ointment (BACTROBAN) 2 % Apply 1 application topically 2 (two) times daily.   01/05/2021   rosuvastatin (CRESTOR) 10 MG tablet Take 10 mg by mouth every evening.   01/04/2021   timolol (TIMOPTIC) 0.5 % ophthalmic solution Place 1 drop into both eyes 2 (two) times daily.   4 01/05/2021   topiramate (TOPAMAX) 100 MG tablet Take 100 mg by mouth at bedtime.   01/04/2021   Vitamin D, Ergocalciferol, (DRISDOL) 1.25 MG (50000 UT) CAPS capsule Take 50,000 Units by mouth once a week.   Past Week   acetaminophen (TYLENOL) 500 MG tablet Take 1,000 mg by mouth every 6 (six) hours as needed for moderate pain or headache.    prn at unknown    Current medications: Current Facility-Administered Medications  Medication Dose Route Frequency Provider Last Rate Last Admin   acetaminophen (TYLENOL) tablet 1,000 mg  1,000 mg Oral Q6H PRN Sueanne Margarita, DO  heparin injection 5,000 Units  5,000 Units Subcutaneous Q8H Sueanne Margarita, DO       [START ON 01/06/2021] insulin aspart (novoLOG) injection 0-6 Units  0-6 Units Subcutaneous TID WC Sueanne Margarita, DO       insulin aspart (novoLOG) injection 5 Units  5 Units Intravenous Once Sueanne Margarita, DO       rosuvastatin (CRESTOR) tablet 10 mg  10 mg Oral QPM Sueanne Margarita, DO       topiramate (TOPAMAX) tablet 100 mg  100 mg Oral QHS Sueanne Margarita, DO          Allergies: Allergies  Allergen Reactions   Penicillins Itching, Other (See Comments) and Swelling    Has patient had a PCN reaction causing immediate rash, facial/tongue/throat swelling, SOB or lightheadedness with hypotension:  Has patient had a PCN reaction causing severe rash involving mucus membranes or skin necrosis:  Has patient had a PCN reaction  that required hospitalization: Has patient had a PCN reaction occurring within the last 10 years: No If all of the above answers are "NO", then may proceed with Cephalosporin use.  Other reaction(s): Other (See Comments), Other (See Comments) Has patient had a PCN reaction causing immediate rash, facial/tongue/throat swelling, SOB or lightheadedness with hypotension:  Has patient had a PCN reaction causing severe rash involving mucus membranes or skin necrosis:  Has patient had a PCN reaction that required hospitalization: Has patient had a PCN reaction occurring within the last 10 years: No If all of the above answers are "NO", then may proceed with Cephalosporin use. Has patient had a PCN reaction causing immediate rash, facial/tongue/throat swelling, SOB or lightheadedness with hypotension:  Has patient had a PCN reaction causing severe rash involving mucus membranes or skin necrosis:  Has patient had a PCN reaction that required hospitalization: Has patient had a PCN reaction occurring within the last 10 years: No If all of the above answers are "NO", then may proceed with Cephalosporin use.   Gabapentin Other (See Comments) and Itching    Hair loss  Hair loss Other reaction(s): Other (See Comments), Other (See Comments), Other (See Comments) Hair loss Hair loss Hair loss Hair loss Hair loss Hair loss Hair loss Hair loss Hair loss Hair loss       Past Medical History: Past Medical History:  Diagnosis Date   Anemia    Diabetes mellitus without complication ([place])    type 2    Hypertension    Left hand weakness    related to previous stomach abscess surgery    Lymph edema    legs    Migraine      Past Surgical History: Past Surgical History:  Procedure Laterality Date   bilateral cataract surgery      history of stomach surgery      due to left sided abscess of stomach - 2010    LAPAROSCOPIC GASTRIC SLEEVE RESECTION N/A 02/08/2018   Procedure: LAPAROSCOPIC  GASTRIC SLEEVE RESECTION WITH HIATAL HERNIA REPAIR AND UPPER ENDO, ERAS Pathway;  Surgeon: Johnathan Hausen, MD;  Location: [facility];  Service: General;  Laterality: N/A;     Family History: Family History  Problem Relation Age of Onset   Diabetes Sister      Social History: Social History   Socioeconomic History   Marital status: Single    Spouse name: Not on file   Number of children: Not on file   Years of education: Not on file   Highest education level: Not on file  Occupational History   Not on file  Tobacco Use   Smoking status: Never   Smokeless tobacco: Never  Vaping Use   Vaping Use: Never used  Substance and Sexual Activity   Alcohol use: Never   Drug use: Never   Sexual activity: Not on file  Other Topics Concern   Not on file  Social History Narrative   Lives in [place]; with self; used in health care field/disbaled; no smoking or alcohol.   Social Determinants of Health   Financial Resource Strain: Not on file  Food Insecurity: Not on file  Transportation Needs: Not on file  Physical Activity: Not on file  Stress: Not on file  Social Connections: Not on file  Intimate Partner Violence: Not on file     Review of Systems: As per HPI  Vital Signs: Blood pressure (!) 118/95, pulse 66, temperature (!) 97.5 F (36.4 C), temperature source Oral, resp. rate (!) 22, height 5\' 9"  (1.753 m), weight (!) 163 kg, SpO2 100 %.  Weight trends: Danley Danker Weights   01/05/21 1359 01/05/21 2051  Weight: (!) 158.8 kg (!) 163 kg    Physical Exam: Physical Exam: General:  No acute distress  Head:  Normocephalic, atraumatic. Moist oral mucosal membranes  Eyes:  Anicteric  Neck:  Supple  Lungs:   Clear to auscultation, normal effort  Heart:  S1S2 no rubs  Abdomen:   Soft, nontender, bowel sounds present  Extremities:  peripheral edema.  Neurologic:  Awake, alert, following commands  Skin:  No lesions  Access:     Lab results:  Basic Metabolic  Panel: Recent Labs  Lab 01/05/21 1400 01/05/21 1800  NA 138 138  K 6.7* 6.2*  CL 119* 120*  CO2 16* 15*  GLUCOSE 100* 49*  BUN 43* 41*  CREATININE 2.11* 2.05*  CALCIUM 9.3 9.7    CBC: Recent Labs  Lab 01/05/21 1400  WBC 6.2  NEUTROABS 4.2  HGB 7.9*  HCT 26.5*  MCV 83.1  PLT 198    Microbiology: Results for orders placed or performed during the hospital encounter of 01/05/21  Resp Panel by RT-PCR (Flu A&B, Covid) Nasopharyngeal Swab     Status: None   Collection Time: 01/05/21  5:15 PM   Specimen: Nasopharyngeal Swab; Nasopharyngeal(NP) swabs in vial transport medium  Result Value Ref Range Status   SARS Coronavirus 2 by RT PCR NEGATIVE NEGATIVE Final    Comment: (NOTE) SARS-CoV-2 target nucleic acids are NOT DETECTED.  The SARS-CoV-2 RNA is generally detectable in upper respiratory specimens during the acute phase of infection. The lowest concentration of SARS-CoV-2 viral copies this assay can detect is 138 copies/mL. A negative result does not preclude SARS-Cov-2 infection and should not be used as the sole basis for treatment or other patient management decisions. A negative result may occur with  improper specimen collection/handling, submission of specimen other than nasopharyngeal swab, presence of viral mutation(s) within the areas targeted by this assay, and inadequate number of viral copies(<138 copies/mL). A negative result must be combined with clinical observations, patient history, and epidemiological information. The expected result is Negative.  Fact Sheet for Patients:  EntrepreneurPulse.com.au  Fact Sheet for Healthcare Providers:  IncredibleEmployment.be  This test is no t yet approved or cleared by the [place] FDA and  has been authorized for detection and/or diagnosis of SARS-CoV-2 by FDA under an Emergency Use Authorization (EUA). This EUA will remain  in effect (meaning this test can be used) for  the duration of  the COVID-19 declaration under Section 564(b)(1) of the Act, 21 U.S.C.section 360bbb-3(b)(1), unless the authorization is terminated  or revoked sooner.       Influenza A by PCR NEGATIVE NEGATIVE Final   Influenza B by PCR NEGATIVE NEGATIVE Final    Comment: (NOTE) The Xpert Xpress SARS-CoV-2/FLU/RSV plus assay is intended as an aid in the diagnosis of influenza from Nasopharyngeal swab specimens and should not be used as a sole basis for treatment. Nasal washings and aspirates are unacceptable for Xpert Xpress SARS-CoV-2/FLU/RSV testing.  Fact Sheet for Patients: EntrepreneurPulse.com.au  Fact Sheet for Healthcare Providers: IncredibleEmployment.be  This test is not yet approved or cleared by the [place] FDA and has been authorized for detection and/or diagnosis of SARS-CoV-2 by FDA under an Emergency Use Authorization (EUA). This EUA will remain in effect (meaning this test can be used) for the duration of the COVID-19 declaration under Section 564(b)(1) of the Act, 21 U.S.C. section 360bbb-3(b)(1), unless the authorization is terminated or revoked.  Performed at [facility], [address] [place], [place] [address]     Urinalysis: No results for input(s): COLORURINE, LABSPEC, PHURINE, GLUCOSEU, HGBUR, BILIRUBINUR, KETONESUR, PROTEINUR, UROBILINOGEN, NITRITE, LEUKOCYTESUR in the last 72 hours.  Invalid input(s): APPERANCEUR   Imaging:  No results found.   Assessment & Plan:   Active Problems:   Hyperkalemia  65 y.o. female with a PMHx of diabetes, peripheral vascular disease, nonhealing leg ulcers, hypertension, coronary artery disease, obesity, s/p gastric bypass surgery, chronic kidney disease stage IIIb with anemia and hyperkalemia now admitted with history of nonhealing right leg ulcers.   #1: Hyperkalemia: Hyperkalemia is most likely secondary to acute kidney injury on the top of chronic  kidney disease, ACE inhibitor's and nonsteroidal anti-inflammatory drug use.  Hyperkalemia protocol was administered with improvement in potassium.  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  #3: Bradycardia: Patient has asymptomatic bradycardia which has improved with correction of potassium.  #4: Nonhealing leg ulcers: Surgery/vascular evaluation.  Which she had not hold the ACE inhibitor's and also nonsteroidal anti-inflammatory drugs. We will continue to monitor closely during hospitalization.   LOS: 0 Lyla Son, MD [facility]. 11/13/20229:35 PM

## 2021-01-05 NOTE — ED Provider Notes (Signed)
[facility] Emergency Department Provider Note   ____________________________________________   Event Date/Time   First MD Initiated Contact with Patient 01/05/21 1616     (approximate)  I have reviewed the triage vital signs and the nursing notes.   HISTORY  Chief Complaint No chief complaint on file.    HPI Veronica Krueger is a 65 y.o. female who presents for an ulcer to the right lateral ankle that is nonhealing  LOCATION: Right lateral ankle DURATION: 5 days prior to presentation TIMING: Worsening since onset SEVERITY: Mild QUALITY: Ulceration CONTEXT: Patient states that she has been dealing with a chronic wound of the left ankle and has been on antibiotics for the last 20 days.  Patient states that after starting doxycycline approximately 5 days ago she noticed that the next day and another ulcer on the right side popped up MODIFYING FACTORS: Any palpation of this ulcer worsens her pain and states that doxycycline has worsened the ulceration and she denies any relieving factors. 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 Authorizing Provider  acetaminophen (TYLENOL) 500 MG tablet Take 1,000 mg by mouth every 6 (six) hours as needed for moderate pain or headache.     [provider]  allopurinol (ZYLOPRIM) 100 MG tablet  03/17/18   [provider]  dorzolamide (TRUSOPT) 2 % ophthalmic solution Place 1 drop into the right eye 2 (two) times daily.  07/20/17   [provider]  ferrous sulfate 325 (65 FE) MG EC tablet TAKE ONE TABLET IN THE MORNING DAILY 03/17/18   [provider]  glipiZIDE (GLUCOTROL) 5 MG tablet Take 5 mg by mouth daily. 07/11/17   [provider]  latanoprost (XALATAN) 0.005 % ophthalmic solution Place 1 drop into the right eye at bedtime.  06/21/17   [provider]  lisinopril-hydrochlorothiazide (PRINZIDE,ZESTORETIC) 10-12.5 MG tablet Take 1 tablet by mouth 2 (two) times daily. 07/11/17   [provider]  methylPREDNISolone (MEDROL DOSEPAK) 4 MG TBPK tablet TAKE 6 TABLETS ON DAY 1 AS DIRECTED ON PACKAGE AND DECREASE BY 1 TAB EACH DAY FOR A TOTAL OF 6 DAYS 07/19/18   [provider]  metoprolol tartrate (LOPRESSOR) 25 MG tablet Take 25 mg by  mouth 2 (two) times daily.    [provider]  pantoprazole (PROTONIX) 40 MG tablet Take 1 tablet (40 mg total) by mouth daily. 02/09/18   Johnathan Hausen, MD  timolol (TIMOPTIC) 0.5 % ophthalmic solution Place 1 drop into both eyes 2 (two) times daily.  07/08/17   [provider]  Vitamin D, Ergocalciferol, (DRISDOL) 1.25 MG (50000 UT) CAPS capsule Take 50,000 Units by mouth once a week. 03/17/18   [provider]    Allergies Gabapentin and Penicillins  Family History  Problem Relation Age of  Onset   Diabetes Sister     Social History Social History   Tobacco Use   Smoking status: Never   Smokeless tobacco: Never  Vaping Use   Vaping Use: Never used  Substance Use Topics   Alcohol use: Never   Drug use: Never    Review of Systems Constitutional: No fever/chills Eyes: No visual changes. ENT: No sore throat. Cardiovascular: Denies chest pain. Respiratory: Denies shortness of breath. Gastrointestinal: No abdominal pain.  No nausea, no vomiting.  No diarrhea. Genitourinary: Negative for dysuria. Musculoskeletal: Negative for acute arthralgias Skin: Positive for chronic wound to the left lateral ankle and new ulceration to the right lateral ankle Neurological: Negative for headaches, weakness/numbness/paresthesias in any extremity Psychiatric: Negative for suicidal ideation/homicidal ideation   ____________________________________________   PHYSICAL EXAM:  VITAL SIGNS: ED Triage Vitals  Enc Vitals Group     BP 01/05/21 1356 137/64     Pulse Rate 01/05/21 1356 65     Resp 01/05/21 1356 20     Temp 01/05/21 1356 98 F (36.7 C)     Temp Source 01/05/21 1356 Oral     SpO2 01/05/21 1356 98 %     Weight 01/05/21 1359 (!) 350 lb (158.8 kg)     Height 01/05/21 1359 5\' 9"  (1.753 m)     Head Circumference --      Peak Flow --      Pain Score --      Pain Loc --      Pain Edu? --      Excl. in [place]? --    Constitutional: Alert and oriented.  Chronically ill appearing morbidly obese elderly African-American female in no acute distress. Eyes: Conjunctivae are normal. PERRL. Head: Atraumatic. Nose: No congestion/rhinnorhea. Mouth/Throat: Mucous membranes are moist. Neck: No stridor Cardiovascular: Grossly normal heart sounds.  Good peripheral circulation. Respiratory: Normal respiratory effort.  No retractions. Gastrointestinal: Soft and nontender. No distention. Musculoskeletal: No obvious deformities Neurologic:  Normal speech and language. No gross focal  neurologic deficits are appreciated. Skin:  Skin is warm and dry.  Multiple deep ulcerations to the left lateral ankle with wound care dressing in place.  Approximately 3 cm diameter area of ulceration with small amount of weeping fluid from the posterior aspect at the right lateral ankle Psychiatric: Mood and affect are normal. Speech and behavior are normal.  ____________________________________________   LABS (all labs ordered are listed, but only abnormal results are displayed)  Labs Reviewed  CBC WITH DIFFERENTIAL/PLATELET - Abnormal; Notable for the following components:      Result Value   RBC 3.19 (*)    Hemoglobin 7.9 (*)    HCT 26.5 (*)    MCH 24.8 (*)    MCHC 29.8 (*)    RDW 16.9 (*)    All other components within normal limits  COMPREHENSIVE METABOLIC PANEL - Abnormal; Notable for the following components:   Potassium 6.7 (*)  Chloride 119 (*)    CO2 16 (*)    Glucose, Bld 100 (*)    BUN 43 (*)    Creatinine, Ser 2.11 (*)    GFR, Estimated 26 (*)    Anion gap 3 (*)    All other components within normal limits  AEROBIC CULTURE W GRAM STAIN (SUPERFICIAL SPECIMEN)  RESP PANEL BY RT-PCR (FLU A&B, COVID) [id number]   ____________________________________________  EKG  ED ECG REPORT I, Naaman Plummer, the attending physician, personally viewed and interpreted this ECG.  Date: 01/05/2021 EKG Time: 1626 Rate: 60 Rhythm: normal sinus rhythm QRS Axis: normal Intervals: normal ST/T Wave abnormalities: normal Narrative Interpretation: no evidence of acute ischemia  ____________________________________________ PROCEDURES  Procedure(s) performed (including Critical Care):  .1-3 Lead EKG Interpretation Performed by: Naaman Plummer, MD Authorized by: Naaman Plummer, MD     Interpretation: normal     ECG rate:  66   ECG rate assessment: normal     Rhythm: sinus rhythm     Ectopy: none     Conduction: normal    CRITICAL CARE Performed by: Naaman Plummer   Total critical care time: 35 minutes  Critical care time was exclusive of separately billable procedures and treating other patients.  Critical care was necessary to treat or prevent imminent or life-threatening deterioration.  Critical care was time spent personally by me on the following activities: development of treatment plan with patient and/or surrogate as well as nursing, discussions with consultants, evaluation of patient's response to treatment, examination of patient, obtaining history from patient or surrogate, ordering and performing treatments and interventions, ordering and review of laboratory studies, ordering and review of radiographic studies, pulse oximetry and re-evaluation of patient's condition.  ____________________________________________   INITIAL IMPRESSION / ASSESSMENT AND PLAN / ED COURSE  As part of my medical decision making, I reviewed the following data within the electronic medical record, if available:  Nursing notes reviewed and incorporated, Labs reviewed, EKG interpreted, Old chart reviewed, Radiograph reviewed and Notes from prior ED visits reviewed and incorporated     Patient is a 65 year old female who presents for a new ulceration to the right lateral ankle and incidentally found to have a potassium of 6.7 Workup: CBC, BMP, ECG Findings: ECG: shows no concerning T wave morphology, interval prolongation or other findings concerning for severe potassium related changes. Interventions:  Calcium Gluconate 79ml of a 10%  solution over 10 mins with close hemodynamic monitoring for potential osmotic shift. Insulin 10 units (regular) IV with 1 amp D50 Sodium Bicarb 1 amp over 5 min Lasix 40mg    Not secondary to crush or thermal injury. Doubt TLS or DKA.  Dispo: Admit to medicine      ____________________________________________   FINAL CLINICAL IMPRESSION(S) / ED DIAGNOSES  Final diagnoses:  Hyperkalemia  Lower limb ulcer, ankle,  right, limited to breakdown of skin [facility])     ED Discharge Orders     None        Note:  This document was prepared using Dragon voice recognition software and may include unintentional dictation errors.    Naaman Plummer, MD 01/05/21 [phone]

## 2021-01-06 ENCOUNTER — Observation Stay: Payer: Medicare HMO

## 2021-01-06 DIAGNOSIS — N179 Acute kidney failure, unspecified: Secondary | ICD-10-CM | POA: Diagnosis present

## 2021-01-06 DIAGNOSIS — Z743 Need for continuous supervision: Secondary | ICD-10-CM | POA: Diagnosis not present

## 2021-01-06 DIAGNOSIS — I2510 Atherosclerotic heart disease of native coronary artery without angina pectoris: Secondary | ICD-10-CM | POA: Diagnosis not present

## 2021-01-06 DIAGNOSIS — R531 Weakness: Secondary | ICD-10-CM | POA: Diagnosis not present

## 2021-01-06 DIAGNOSIS — M109 Gout, unspecified: Secondary | ICD-10-CM | POA: Diagnosis not present

## 2021-01-06 DIAGNOSIS — L97329 Non-pressure chronic ulcer of left ankle with unspecified severity: Secondary | ICD-10-CM | POA: Diagnosis not present

## 2021-01-06 DIAGNOSIS — D649 Anemia, unspecified: Secondary | ICD-10-CM | POA: Diagnosis not present

## 2021-01-06 DIAGNOSIS — Z7401 Bed confinement status: Secondary | ICD-10-CM | POA: Diagnosis not present

## 2021-01-06 DIAGNOSIS — Z9884 Bariatric surgery status: Secondary | ICD-10-CM | POA: Diagnosis not present

## 2021-01-06 DIAGNOSIS — K279 Peptic ulcer, site unspecified, unspecified as acute or chronic, without hemorrhage or perforation: Secondary | ICD-10-CM | POA: Diagnosis not present

## 2021-01-06 DIAGNOSIS — I83028 Varicose veins of left lower extremity with ulcer other part of lower leg: Secondary | ICD-10-CM | POA: Diagnosis not present

## 2021-01-06 DIAGNOSIS — Z20822 Contact with and (suspected) exposure to covid-19: Secondary | ICD-10-CM | POA: Diagnosis not present

## 2021-01-06 DIAGNOSIS — I10 Essential (primary) hypertension: Secondary | ICD-10-CM | POA: Diagnosis not present

## 2021-01-06 DIAGNOSIS — Z6841 Body Mass Index (BMI) 40.0 and over, adult: Secondary | ICD-10-CM | POA: Diagnosis not present

## 2021-01-06 DIAGNOSIS — R600 Localized edema: Secondary | ICD-10-CM | POA: Diagnosis not present

## 2021-01-06 DIAGNOSIS — R200 Anesthesia of skin: Secondary | ICD-10-CM | POA: Diagnosis not present

## 2021-01-06 DIAGNOSIS — E1122 Type 2 diabetes mellitus with diabetic chronic kidney disease: Secondary | ICD-10-CM | POA: Diagnosis not present

## 2021-01-06 DIAGNOSIS — L97319 Non-pressure chronic ulcer of right ankle with unspecified severity: Secondary | ICD-10-CM | POA: Diagnosis not present

## 2021-01-06 DIAGNOSIS — E875 Hyperkalemia: Secondary | ICD-10-CM | POA: Diagnosis not present

## 2021-01-06 DIAGNOSIS — N1832 Chronic kidney disease, stage 3b: Secondary | ICD-10-CM | POA: Diagnosis not present

## 2021-01-06 DIAGNOSIS — E11649 Type 2 diabetes mellitus with hypoglycemia without coma: Secondary | ICD-10-CM | POA: Diagnosis not present

## 2021-01-06 DIAGNOSIS — I878 Other specified disorders of veins: Secondary | ICD-10-CM | POA: Diagnosis not present

## 2021-01-06 DIAGNOSIS — I959 Hypotension, unspecified: Secondary | ICD-10-CM | POA: Diagnosis not present

## 2021-01-06 DIAGNOSIS — Z833 Family history of diabetes mellitus: Secondary | ICD-10-CM | POA: Diagnosis not present

## 2021-01-06 DIAGNOSIS — I83018 Varicose veins of right lower extremity with ulcer other part of lower leg: Secondary | ICD-10-CM | POA: Diagnosis not present

## 2021-01-06 DIAGNOSIS — G43909 Migraine, unspecified, not intractable, without status migrainosus: Secondary | ICD-10-CM | POA: Diagnosis not present

## 2021-01-06 DIAGNOSIS — E8722 Chronic metabolic acidosis: Secondary | ICD-10-CM | POA: Diagnosis not present

## 2021-01-06 DIAGNOSIS — E872 Acidosis, unspecified: Secondary | ICD-10-CM | POA: Diagnosis not present

## 2021-01-06 DIAGNOSIS — R001 Bradycardia, unspecified: Secondary | ICD-10-CM | POA: Diagnosis not present

## 2021-01-06 DIAGNOSIS — I129 Hypertensive chronic kidney disease with stage 1 through stage 4 chronic kidney disease, or unspecified chronic kidney disease: Secondary | ICD-10-CM | POA: Diagnosis not present

## 2021-01-06 DIAGNOSIS — E1151 Type 2 diabetes mellitus with diabetic peripheral angiopathy without gangrene: Secondary | ICD-10-CM | POA: Diagnosis not present

## 2021-01-06 DIAGNOSIS — I70213 Atherosclerosis of native arteries of extremities with intermittent claudication, bilateral legs: Secondary | ICD-10-CM | POA: Diagnosis not present

## 2021-01-06 HISTORY — DX: Acute kidney failure, unspecified: N17.9

## 2021-01-06 LAB — COMPREHENSIVE METABOLIC PANEL
ALT: 17 U/L (ref 0–44)
AST: 28 U/L (ref 15–41)
Albumin: 3 g/dL — ABNORMAL LOW (ref 3.5–5.0)
Alkaline Phosphatase: 40 U/L (ref 38–126)
Anion gap: 8 (ref 5–15)
BUN: 38 mg/dL — ABNORMAL HIGH (ref 8–23)
CO2: 14 mmol/L — ABNORMAL LOW (ref 22–32)
Calcium: 9.7 mg/dL (ref 8.9–10.3)
Chloride: 122 mmol/L — ABNORMAL HIGH (ref 98–111)
Creatinine, Ser: 1.81 mg/dL — ABNORMAL HIGH (ref 0.44–1.00)
GFR, Estimated: 31 mL/min — ABNORMAL LOW (ref >60)
Glucose, Bld: 89 mg/dL (ref 70–99)
Potassium: 6.1 mmol/L — ABNORMAL HIGH (ref 3.5–5.1)
Sodium: 144 mmol/L (ref 135–145)
Total Bilirubin: 0.4 mg/dL (ref 0.3–1.2)
Total Protein: 6.8 g/dL (ref 6.5–8.1)

## 2021-01-06 LAB — GLUCOSE, CAPILLARY
Glucose-Capillary: 73 mg/dL (ref 70–99)
Glucose-Capillary: 78 mg/dL (ref 70–99)
Glucose-Capillary: 68 mg/dL — ABNORMAL LOW (ref 70–99)
Glucose-Capillary: 68 mg/dL — ABNORMAL LOW (ref 70–99)
Glucose-Capillary: 79 mg/dL (ref 70–99)
Glucose-Capillary: 80 mg/dL (ref 70–99)
Glucose-Capillary: 94 mg/dL (ref 70–99)
Glucose-Capillary: 117 mg/dL — ABNORMAL HIGH (ref 70–99)
Glucose-Capillary: 159 mg/dL — ABNORMAL HIGH (ref 70–99)

## 2021-01-06 LAB — CBC
HCT: 26.9 % — ABNORMAL LOW (ref 36.0–46.0)
Hemoglobin: 8 g/dL — ABNORMAL LOW (ref 12.0–15.0)
MCH: 24.5 pg — ABNORMAL LOW (ref 26.0–34.0)
MCHC: 29.7 g/dL — ABNORMAL LOW (ref 30.0–36.0)
MCV: 82.3 fL (ref 80.0–100.0)
Platelets: 184 10*3/uL (ref 150–400)
RBC: 3.27 MIL/uL — ABNORMAL LOW (ref 3.87–5.11)
RDW: 16.9 % — ABNORMAL HIGH (ref 11.5–15.5)
WBC: 5.8 10*3/uL (ref 4.0–10.5)
nRBC: 0 % (ref 0.0–0.2)

## 2021-01-06 LAB — BLOOD GAS, VENOUS
Acid-base deficit: 7 mmol/L — ABNORMAL HIGH (ref 0.0–2.0)
Bicarbonate: 18.6 mmol/L — ABNORMAL LOW (ref 20.0–28.0)
O2 Saturation: 67.6 %
Patient temperature: 37
pCO2, Ven: 37 mmHg — ABNORMAL LOW (ref 44.0–60.0)
pH, Ven: 7.31 (ref 7.250–7.430)
pO2, Ven: 39 mmHg (ref 32.0–45.0)

## 2021-01-06 LAB — BASIC METABOLIC PANEL
Anion gap: 7 (ref 5–15)
BUN: 34 mg/dL — ABNORMAL HIGH (ref 8–23)
CO2: 18 mmol/L — ABNORMAL LOW (ref 22–32)
Calcium: 9.5 mg/dL (ref 8.9–10.3)
Chloride: 117 mmol/L — ABNORMAL HIGH (ref 98–111)
Creatinine, Ser: 1.72 mg/dL — ABNORMAL HIGH (ref 0.44–1.00)
GFR, Estimated: 33 mL/min — ABNORMAL LOW (ref >60)
Glucose, Bld: 97 mg/dL (ref 70–99)
Potassium: 5.1 mmol/L (ref 3.5–5.1)
Sodium: 142 mmol/L (ref 135–145)

## 2021-01-06 LAB — HEMOGLOBIN A1C
Hgb A1c MFr Bld: 6.1 % — ABNORMAL HIGH (ref 4.8–5.6)
Mean Plasma Glucose: 128 mg/dL

## 2021-01-06 LAB — POTASSIUM: Potassium: 5.9 mmol/L — ABNORMAL HIGH (ref 3.5–5.1)

## 2021-01-06 LAB — C-REACTIVE PROTEIN: CRP: 1.4 mg/dL — ABNORMAL HIGH (ref <1.0)

## 2021-01-06 LAB — TROPONIN I (HIGH SENSITIVITY): Troponin I (High Sensitivity): 23 ng/L — ABNORMAL HIGH (ref <18)

## 2021-01-06 LAB — SEDIMENTATION RATE: Sed Rate: 68 mm/hr — ABNORMAL HIGH (ref 0–30)

## 2021-01-06 LAB — HIV ANTIBODY (ROUTINE TESTING W REFLEX): HIV Screen 4th Generation wRfx: NONREACTIVE

## 2021-01-06 MED ORDER — TIMOLOL MALEATE 0.5 % OP SOLN
1.0000 [drp] | Freq: Two times a day (BID) | OPHTHALMIC | Status: DC
  Administered 2021-01-06 – 2021-01-09 (×7): 1 [drp] via OPHTHALMIC
  Filled 2021-01-06 (×2): qty 5

## 2021-01-06 MED ORDER — ENOXAPARIN SODIUM 80 MG/0.8ML IJ SOSY
0.5000 mg/kg | PREFILLED_SYRINGE | INTRAMUSCULAR | Status: DC
  Filled 2021-01-06: qty 0.82

## 2021-01-06 MED ORDER — SODIUM BICARBONATE 8.4 % IV SOLN: 50.0000 meq | Freq: Once | INTRAVENOUS | Status: DC

## 2021-01-06 MED ORDER — INSULIN ASPART 100 UNIT/ML IJ SOLN: 0.0000 [IU] | Freq: Three times a day (TID) | INTRAMUSCULAR | Status: DC

## 2021-01-06 MED ORDER — FUROSEMIDE 10 MG/ML IJ SOLN
60.0000 mg | Freq: Once | INTRAMUSCULAR | Status: AC
  Administered 2021-01-06: 60 mg via INTRAVENOUS
  Filled 2021-01-06: qty 6

## 2021-01-06 MED ORDER — TOPIRAMATE 100 MG PO TABS
100.0000 mg | ORAL_TABLET | Freq: Every morning | ORAL | Status: DC
  Filled 2021-01-06: qty 1

## 2021-01-06 MED ORDER — TOPIRAMATE 100 MG PO TABS
100.0000 mg | ORAL_TABLET | Freq: Every day | ORAL | Status: DC
  Filled 2021-01-06: qty 1

## 2021-01-06 MED ORDER — CHLORHEXIDINE GLUCONATE CLOTH 2 % EX PADS
6.0000 | MEDICATED_PAD | Freq: Every day | CUTANEOUS | Status: DC
  Administered 2021-01-06 – 2021-01-08 (×3): 6 via TOPICAL

## 2021-01-06 MED ORDER — SODIUM BICARBONATE 8.4 % IV SOLN
INTRAVENOUS | Status: AC
  Filled 2021-01-06: qty 50

## 2021-01-06 MED ORDER — ALLOPURINOL 100 MG PO TABS
100.0000 mg | ORAL_TABLET | Freq: Every day | ORAL | Status: DC
  Administered 2021-01-06 – 2021-01-09 (×4): 100 mg via ORAL
  Filled 2021-01-06 (×4): qty 1

## 2021-01-06 MED ORDER — DORZOLAMIDE HCL 2 % OP SOLN
1.0000 [drp] | Freq: Two times a day (BID) | OPHTHALMIC | Status: DC
  Administered 2021-01-06 – 2021-01-09 (×7): 1 [drp] via OPHTHALMIC
  Filled 2021-01-06: qty 10

## 2021-01-06 MED ORDER — DEXTROSE 50 % IV SOLN
1.0000 | Freq: Once | INTRAVENOUS | Status: DC
  Filled 2021-01-06: qty 50

## 2021-01-06 MED ORDER — FERROUS SULFATE 325 (65 FE) MG PO TABS
325.0000 mg | ORAL_TABLET | Freq: Every day | ORAL | Status: DC
  Administered 2021-01-07 – 2021-01-09 (×3): 325 mg via ORAL
  Filled 2021-01-06 (×3): qty 1

## 2021-01-06 MED ORDER — SODIUM BICARBONATE 8.4 % IV SOLN
100.0000 meq | Freq: Once | INTRAVENOUS | Status: AC
  Administered 2021-01-06: 100 meq via INTRAVENOUS
  Filled 2021-01-06 (×2): qty 50

## 2021-01-06 MED ORDER — INSULIN ASPART 100 UNIT/ML IV SOLN
10.0000 [IU] | Freq: Once | INTRAVENOUS | Status: DC
  Filled 2021-01-06 (×2): qty 0.1

## 2021-01-06 MED ORDER — LATANOPROST 0.005 % OP SOLN
1.0000 [drp] | Freq: Every day | OPHTHALMIC | Status: DC
  Administered 2021-01-06 – 2021-01-08 (×3): 1 [drp] via OPHTHALMIC
  Filled 2021-01-06: qty 2.5

## 2021-01-06 MED ORDER — METOPROLOL TARTRATE 25 MG PO TABS
25.0000 mg | ORAL_TABLET | Freq: Two times a day (BID) | ORAL | Status: DC
  Administered 2021-01-06 – 2021-01-09 (×5): 25 mg via ORAL
  Filled 2021-01-06 (×6): qty 1

## 2021-01-06 MED ORDER — INSULIN ASPART 100 UNIT/ML IJ SOLN: 0.0000 [IU] | Freq: Every day | INTRAMUSCULAR | Status: DC

## 2021-01-06 MED ORDER — TOPIRAMATE 100 MG PO TABS: 100.0000 mg | ORAL_TABLET | Freq: Every day | ORAL | Status: DC

## 2021-01-06 MED ORDER — ENOXAPARIN SODIUM 100 MG/ML IJ SOSY
0.5000 mg/kg | PREFILLED_SYRINGE | INTRAMUSCULAR | Status: DC
  Administered 2021-01-06 – 2021-01-07 (×2): 82.5 mg via SUBCUTANEOUS
  Filled 2021-01-06 (×3): qty 0.82

## 2021-01-06 MED ORDER — TOPIRAMATE 100 MG PO TABS
100.0000 mg | ORAL_TABLET | Freq: Every day | ORAL | Status: DC
  Administered 2021-01-06 – 2021-01-09 (×4): 100 mg via ORAL
  Filled 2021-01-06 (×5): qty 1

## 2021-01-06 MED ORDER — INSULIN ASPART 100 UNIT/ML IJ SOLN: 10.0000 [IU] | Freq: Once | INTRAMUSCULAR | Status: DC

## 2021-01-06 MED ORDER — DEXTROSE 50 % IV SOLN
50.0000 mL | Freq: Once | INTRAVENOUS | Status: AC
  Administered 2021-01-06: 50 mL via INTRAVENOUS

## 2021-01-06 MED ORDER — COLLAGENASE 250 UNIT/GM EX OINT
TOPICAL_OINTMENT | Freq: Every day | CUTANEOUS | Status: DC
  Filled 2021-01-06: qty 30

## 2021-01-06 MED ORDER — SODIUM BICARBONATE 8.4 % IV SOLN
INTRAVENOUS | Status: DC
  Filled 2021-01-06 (×2): qty 1000
  Filled 2021-01-06 (×2): qty 150
  Filled 2021-01-06: qty 1000

## 2021-01-06 MED ORDER — ENOXAPARIN SODIUM 40 MG/0.4ML IJ SOSY: 40.0000 mg | PREFILLED_SYRINGE | INTRAMUSCULAR | Status: DC

## 2021-01-06 MED ORDER — INSULIN ASPART 100 UNIT/ML IV SOLN
5.0000 [IU] | Freq: Once | INTRAVENOUS | Status: AC
  Administered 2021-01-06: 5 [IU] via INTRAVENOUS
  Filled 2021-01-06: qty 0.05

## 2021-01-06 MED ORDER — CALCIUM GLUCONATE-NACL 1-0.675 GM/50ML-% IV SOLN
1.0000 g | Freq: Once | INTRAVENOUS | Status: AC
  Administered 2021-01-06: 1000 mg via INTRAVENOUS
  Filled 2021-01-06: qty 50

## 2021-01-06 NOTE — Progress Notes (Signed)
Patient had bs of 68 was given orange juice came up to 79. Per md ok to give insulin (changed from 10 to 5U) along with the d50.lm

## 2021-01-06 NOTE — Progress Notes (Addendum)
Patient had multiple runs of Idoventricular Rhythm during this RN shift. Patient asymptomatic. Awaiting pending lab results at this time. Pablo Ledger., NP notified. No EKG and CBG ordered and obtained.

## 2021-01-06 NOTE — Progress Notes (Signed)
Called report to [place], Therapist, sports. Pt AOX4, vitals wnl upon transfer to 2c.lm

## 2021-01-06 NOTE — Progress Notes (Signed)
PHARMACIST - PHYSICIAN COMMUNICATION  CONCERNING:  Enoxaparin (Lovenox) for DVT Prophylaxis    RECOMMENDATION: Patient was prescribed enoxaprin 40mg  q24 hours for VTE prophylaxis.   Filed Weights   01/05/21 1359 01/05/21 2051  Weight: (!) 158.8 kg (350 lb) (!) 163 kg (359 lb 5.6 oz)    Body mass index is 53.07 kg/m.  Estimated Creatinine Clearance: 55.4 mL/min (A) (by C-G formula based on SCr of 1.72 mg/dL (H)).   Based on [place] patient is candidate for enoxaparin 0.5mg /kg TBW SQ every 24 hours based on BMI being >30.  DESCRIPTION: Pharmacy has adjusted enoxaparin dose per [facility] policy.  Patient is now receiving enoxaparin 0.5 mg/kg subcutaneously  every 24 hours    Dallie Piles, PharmD Clinical Pharmacist  01/06/2021 7:45 PM

## 2021-01-06 NOTE — Consult Note (Signed)
[place] Nurse wound consult note Consultation was completed by review of records, images and assistance from the bedside nurse/clinical staff.  Reason for Consult: LE wound Patient with right LE wounds treated as an outpatient. Presents with new ulceration left lateral malleolar area  Wound type: suspect venous ulceration  Pressure Injury POA: NA Measurement: appears to be less than 2cm; will ask beside nurses to measure with topical wound care provision today Wound bed: Left lateral: 90% fibrinous/slough/10% pink Right LE: 100% pink, superficial  Drainage (amount, consistency, odor) see nursing flow sheet Periwound: edema; skin changes consistent with venous dermatitis  Dressing procedure/placement/frequency:  Add topical care with enzymatic debridement to the LLE Silicone foam to the RLE Requested ABI's to determine safety of compression therapy  Will follow up with MD and after ABIs obtained will consider Unna's boots  Will need outpatient wound care center follow up in center of patient's choice and/or HHRN for weekly changes.   [place], [place], [place]

## 2021-01-06 NOTE — Evaluation (Signed)
Physical Therapy Evaluation Patient Details Name: Veronica Krueger MRN: [id number] DOB: 28-Mar-1955 Today's Date: 01/06/2021  History of Present Illness  Veronica Krueger is a 20yoF who comes [facility] after noting to have acute weakness and loss of independent transfers correlating with progressed pain and warmth to RLE. In ED new Right ankle ulcer is found. Has chronic Left ankle ulcer. Pt has been on ABX >2 weeks now has reduced kidney function, hyperkalemia. PMH: anemia, PVD, CAD, DM2, HTN, LUE weakness, BLE lymphedema.  Clinical Impression  Pt admitted with above diagnosis. Pt currently with functional limitations due to the deficits listed below (see "PT Problem List"). Upon entry, pt in bed, awake and agreeable to participate. The pt is alert, pleasant, interactive, and able to provide info regarding prior level of function, both in tolerance and independence. Pt performs bed mobility with minA, but reports substantial increase in difficulty and near-maximal effort, ultimately requires minA for some portions to complete. At EOB, pt is steady, but is continuously swimmy-headed which precludes her consent to attempt a squat pivot transfers to chair. Pt also reports her RLE still feels weaker than baseline and she does not trust that should could transfer safely in her current state. Pt has significant baseline limitations, but has chronic, well-developed mobility strategies which has helped her remain independent in ADL for years. Patient's performance this date reveals decreased ability, independence, and tolerance in performing all basic mobility required for performance of activities of daily living. Pt requires additional DME, close physical assistance, and cues for safe participate in mobility. Pt will benefit from skilled PT intervention to increase independence and safety with basic mobility in preparation for discharge to the venue listed below.          Recommendations for follow  up therapy are one component of a multi-disciplinary discharge planning process, led by the attending physician.  Recommendations may be updated based on patient status, additional functional criteria and insurance authorization.  Follow Up Recommendations Skilled nursing-short term rehab (<3 hours/day) (being unable to mobilize without heavy physical assist is the reason she called EMS; she still is unable to do this.)    Assistance Recommended at Discharge Set up Supervision/Assistance  Functional Status Assessment Patient has had a recent decline in their functional status and demonstrates the ability to make significant improvements in function in a reasonable and predictable amount of time.  Equipment Recommendations  None recommended by PT    Recommendations for Other Services       Precautions / Restrictions        Mobility  Bed Mobility Overal bed mobility: Needs Assistance Bed Mobility: Supine to Sit;Sit to Supine     Supine to sit: Min assist (does 90% of this with increased time/effort, but needs help scooting left hemipelvis anteriorly to allow foot to floor, given minA with draw sheet.) Sit to supine:  (Pt has a well established technique, gives author step by step cues, requires mostly setup, then RNx2 pull her up in bed.)   General bed mobility comments: uses a gait belt at baseline to pull each leg individually into bed.    Transfers Overall transfer level:  (pt does not feel confident in attempting due to sustained swimmyheadedness at EOB; VSS)                      Ambulation/Gait                  Stairs  Wheelchair Mobility    Modified Rankin (Stroke Patients Only)       Balance Overall balance assessment: Needs assistance   Sitting balance-Leahy Scale: Good                                       Pertinent Vitals/Pain      Home Living Family/patient expects to be discharged to:: Private  residence Living Arrangements: Alone Available Help at Discharge: Family (a variety of friends/family assist with IADL as needed regularly; pt does not drive.) Type of Home: House Home Access: Ramped entrance       Home Layout: One level Home Equipment: Wheelchair - manual;Toilet riser;BSC/3in1;Grab bars - tub/shower;Hand held shower head;Shower seat - built in;Other (comment) Additional Comments: has a standard unadjustable bed, without rails; does transfets with RUE on HW bed to/from chair; RUE on BSO riser and WC arm; uses arm rails for WC to/from shower seat; has used a hinged AFO on Rt in remote past due to Rt foot drop.    Prior Function Prior Level of Function : Independent/Modified Independent;Needs assist             Mobility Comments: intermittent ADLs Comments: Mod-I bathing, dressing toiletting;     Hand Dominance   Dominant Hand: Right    Extremity/Trunk Assessment                Communication      Cognition Arousal/Alertness: Awake/alert Behavior During Therapy: WFL for tasks assessed/performed Overall Cognitive Status: Within Functional Limits for tasks assessed                                          General Comments      Exercises Other Exercises Other Exercises: sustained sitting at EOB x15 minutes   Assessment/Plan    PT Assessment Patient needs continued PT services  PT Problem List Decreased strength;Decreased activity tolerance;Decreased mobility       PT Treatment Interventions Functional mobility training;Therapeutic activities;Therapeutic exercise;Patient/family education;Balance training;Neuromuscular re-education    PT Goals (Current goals can be found in the Care Plan section)  Acute Rehab PT Goals Patient Stated Goal: regain baseline mobility with transfers PT Goal Formulation: With patient Time For Goal Achievement: 01/20/21 Potential to Achieve Goals: Fair    Frequency Min 2X/week   Barriers to  discharge        Co-evaluation               AM-PAC PT "6 Clicks" Mobility  Outcome Measure Help needed turning from your back to your side while in a flat bed without using bedrails?: A Lot Help needed moving from lying on your back to sitting on the side of a flat bed without using bedrails?: A Lot Help needed moving to and from a bed to a chair (including a wheelchair)?: A Lot Help needed standing up from a chair using your arms (e.g., wheelchair or bedside chair)?: A Lot Help needed to walk in hospital room?: Total Help needed climbing 3-5 steps with a railing? : Total 6 Click Score: 10    End of Session Equipment Utilized During Treatment: Gait belt Activity Tolerance: Patient tolerated treatment well;Patient limited by fatigue;Other (comment) (swimmy headed feelings) Patient left: in bed;with nursing/sitter in room;with call bell/phone within reach Nurse Communication: Mobility status PT Visit  Diagnosis: Other abnormalities of gait and mobility (R26.89);Muscle weakness (generalized) (M62.81)    Time: 7517-0017 PT Time Calculation (min) (ACUTE ONLY): 58 min   Charges:   PT Evaluation $PT Eval High Complexity: 1 High PT Treatments $Therapeutic Activity: 8-22 mins      5:24 PM, 01/06/21 Etta Grandchild, PT, DPT Physical Therapist - [facility]  [phone] ([place])    Osin[place] Navarrette C 01/06/2021, 5:21 PM

## 2021-01-06 NOTE — Progress Notes (Signed)
1700 Transferred to room 229 via bed.

## 2021-01-06 NOTE — Progress Notes (Signed)
[facility]  ROUNDING NOTE   Subjective:   UOP 2515mL.   K 6.1   Hypoglycemic overnight and was given orange juice.   Objective:  Vital signs in last 24 hours:  Temp:  [97.5 F (36.4 C)-98 F (36.7 C)] 97.8 F (36.6 C) (11/14 0800) Pulse Rate:  [28-88] 88 (11/14 1100) Resp:  [13-25] 18 (11/14 1100) BP: (90-137)/(31-100) 113/90 (11/14 1100) SpO2:  [97 %-100 %] 100 % (11/14 1100) Weight:  [158.8 kg-163 kg] 163 kg (11/13 2051)  Weight change:  Filed Weights   01/05/21 1359 01/05/21 2051  Weight: (!) 158.8 kg (!) 163 kg    Intake/Output: I/O last 3 completed shifts: In: -  Out: 2552 [Urine:2550; Stool:2]   Intake/Output this shift:  Total I/O In: 93.3 [I.V.:43.3; IV Piggyback:50] Out: -   Physical Exam: General: NAD,   Head: Normocephalic, atraumatic. Moist oral mucosal membranes  Eyes: Anicteric, PERRL  Neck: Supple, trachea midline  Lungs:  Clear to auscultation  Heart: Regular rate and rhythm  Abdomen:  Soft, nontender, obese  Extremities: peripheral edema.  Neurologic: Nonfocal, moving all four extremities  Skin: Bilateral lower extremity ulcers         Basic Metabolic Panel: Recent Labs  Lab 01/05/21 1400 01/05/21 1800 01/05/21 1837 01/05/21 1854 01/06/21 0540 01/06/21 0853  NA 138 138  --   --  144 142  K 6.7* 6.2* 5.9*  --  6.1* 5.1  CL 119* 120*  --   --  122* 117*  CO2 16* 15*  --   --  14* 18*  GLUCOSE 100* 49*  --   --  89 97  BUN 43* 41*  --   --  38* 34*  CREATININE 2.11* 2.05*  --   --  1.81* 1.72*  CALCIUM 9.3 9.7  --   --  9.7 9.5  MG  --   --   --  1.9  --   --   PHOS  --   --   --  3.7  --   --     Liver Function Tests: Recent Labs  Lab 01/05/21 1400 01/06/21 0540  AST 22 28  ALT 17 17  ALKPHOS 45 40  BILITOT 0.7 0.4  PROT 8.1 6.8  ALBUMIN 3.7 3.0*   No results for input(s): LIPASE, AMYLASE in the last 168 hours. No results for input(s): AMMONIA in the last 168 hours.  CBC: Recent Labs  Lab  01/05/21 1400 01/06/21 0853  WBC 6.2 5.8  NEUTROABS 4.2  --   HGB 7.9* 8.0*  HCT 26.5* 26.9*  MCV 83.1 82.3  PLT 198 184    Cardiac Enzymes: Recent Labs  Lab 01/05/21 1854  CKTOTAL 413*    BNP: Invalid input(s): POCBNP  CBG: Recent Labs  Lab 01/06/21 0626 01/06/21 0715 01/06/21 0719 01/06/21 0740 01/06/21 0818  GLUCAP 78 68* 68* 65 80    Microbiology: Results for orders placed or performed during the hospital encounter of 01/05/21  Aerobic Culture w Gram Stain (superficial specimen)     Status: None (Preliminary result)   Collection Time: 01/05/21  4:39 PM   Specimen: Wound  Result Value Ref Range Status   Specimen Description   Final    WOUND Performed at [facility], [address], [place], [place] [address]    Special Requests   Final    Normal Performed at [facility], [address] [place], [place] [address]    Gram Stain  Final    NO WBC SEEN NO ORGANISMS SEEN Performed at [facility], [place] [address], [place], [place] [address]    Culture PENDING  Incomplete   Report Status PENDING  Incomplete  Resp Panel by RT-PCR (Flu A&B, Covid) Nasopharyngeal Swab     Status: None   Collection Time: 01/05/21  5:15 PM   Specimen: Nasopharyngeal Swab; Nasopharyngeal(NP) swabs in vial transport medium  Result Value Ref Range Status   SARS Coronavirus 2 by RT PCR NEGATIVE NEGATIVE Final    Comment: (NOTE) SARS-CoV-2 target nucleic acids are NOT DETECTED.  The SARS-CoV-2 RNA is generally detectable in upper respiratory specimens during the acute phase of infection. The lowest concentration of SARS-CoV-2 viral copies this assay can detect is 138 copies/mL. A negative result does not preclude SARS-Cov-2 infection and should not be used as the sole basis for treatment or other patient management decisions. A negative result may occur with  improper specimen collection/handling, submission of specimen other than nasopharyngeal swab,  presence of viral mutation(s) within the areas targeted by this assay, and inadequate number of viral copies(<138 copies/mL). A negative result must be combined with clinical observations, patient history, and epidemiological information. The expected result is Negative.  Fact Sheet for Patients:  EntrepreneurPulse.com.au  Fact Sheet for Healthcare Providers:  IncredibleEmployment.be  This test is no t yet approved or cleared by the [place] FDA and  has been authorized for detection and/or diagnosis of SARS-CoV-2 by FDA under an Emergency Use Authorization (EUA). This EUA will remain  in effect (meaning this test can be used) for the duration of the COVID-19 declaration under Section 564(b)(1) of the Act, 21 U.S.C.section 360bbb-3(b)(1), unless the authorization is terminated  or revoked sooner.       Influenza A by PCR NEGATIVE NEGATIVE Final   Influenza B by PCR NEGATIVE NEGATIVE Final    Comment: (NOTE) The Xpert Xpress SARS-CoV-2/FLU/RSV plus assay is intended as an aid in the diagnosis of influenza from Nasopharyngeal swab specimens and should not be used as a sole basis for treatment. Nasal washings and aspirates are unacceptable for Xpert Xpress SARS-CoV-2/FLU/RSV testing.  Fact Sheet for Patients: EntrepreneurPulse.com.au  Fact Sheet for Healthcare Providers: IncredibleEmployment.be  This test is not yet approved or cleared by the [place] FDA and has been authorized for detection and/or diagnosis of SARS-CoV-2 by FDA under an Emergency Use Authorization (EUA). This EUA will remain in effect (meaning this test can be used) for the duration of the COVID-19 declaration under Section 564(b)(1) of the Act, 21 U.S.C. section 360bbb-3(b)(1), unless the authorization is terminated or revoked.  Performed at [facility], [address] [place], [place] [address]   CULTURE, BLOOD  (ROUTINE X 2) w Reflex to ID Panel     Status: None (Preliminary result)   Collection Time: 01/05/21  9:26 PM   Specimen: BLOOD  Result Value Ref Range Status   Specimen Description BLOOD RIGHT ANTECUBITAL  Final   Special Requests   Final    BOTTLES DRAWN AEROBIC AND ANAEROBIC Blood Culture results may not be optimal due to an inadequate volume of blood received in culture bottles   Culture   Final    NO GROWTH < 12 HOURS Performed at [facility], [address] [place], [place] [address]    Report Status PENDING  Incomplete    Coagulation Studies: No results for input(s): LABPROT, INR in the last 72 hours.  Urinalysis: No results for input(s): COLORURINE, LABSPEC,  PHURINE, GLUCOSEU, HGBUR, BILIRUBINUR, KETONESUR, PROTEINUR, UROBILINOGEN, NITRITE, LEUKOCYTESUR in the last 72 hours.  Invalid input(s): APPERANCEUR    Imaging: No results found.   Medications:    sodium bicarbonate 150 mEq in D5W infusion 75 mL/hr at 01/06/21 1124    Chlorhexidine Gluconate Cloth  6 each Topical Daily   collagenase   Topical Daily   dextrose  1 ampule Intravenous Once   dorzolamide  1 drop Right Eye BID   heparin  5,000 Units Subcutaneous Q8H   insulin aspart  0-6 Units Subcutaneous TID WC   latanoprost  1 drop Right Eye QHS   rosuvastatin  10 mg Oral QPM   sodium bicarbonate       timolol  1 drop Both Eyes BID   [START ON 01/07/2021] topiramate  100 mg Oral Daily   acetaminophen  Assessment/ Plan:  Ms. Brayli Klingbeil is a 65 y.o. black female with diabetes mellitus type II, peripheral vascular disease, nonhealing leg ulcers, hypertension, coronary artery disease, obesity status post bariatric surgery, gout, migraines who is admitted to [facility] 01/05/2021 for Hyperkalemia [E87.5] Lower limb ulcer, ankle, right, limited to breakdown of skin ([place]) [L97.311]  #1: Acute kidney injury on chronic kidney disease stage IIIB with hyperkalemia and metabolic acidosis:  Baseline  creatinine of 1.77, GFR of 30 on 08/10/2018.  - Continue to hold lisinopril. - Low potassium diet. Avoid orange juice please.  - Avoid nonsteroidal anti-inflammatory agents. Apparently patient was on meloxicam and celecoxib at home. - continue sodium bicarbonate infusion.   - IV furosemide 60mg  x 1  #2. Hypertension: with chronic kidney disease:  home regimen of lisinopril, hydrochlorothiazide, and metoprolol. Holding all agents.   #3. Diabetes mellitus type II with chronic kidney disease: history of proteinuria. Noninsulin dependent.  Now with hypoglycemia - Continue dextrose infusion.    LOS: 0 Isaul Landi 11/14/202212:04 PM

## 2021-01-06 NOTE — Progress Notes (Addendum)
PROGRESS NOTE    Veronica Krueger  MBW:[id number] DOB: [birth year] DOA: 01/05/2021 PCP: Mechele Claude, FNP  [phone]   Assessment & Plan:   Active Problems:   Hyperkalemia   AKI (acute kidney injury) ([place])   Veronica Krueger is a 65 y.o. female with a pertinent history of  Veronica Krueger is a 65 y.o. female who presents for an ulcer to the right lateral ankle that is nonhealing from home by EMS to [facility] ED.     She presented initially because of new right hip pain which her provider did diclofenac.  new ulceration to right lateral ankle, has chronic wound on left lateral ankle.  Has been on antibiotics for the last 20ish days, augmentin bactrim and now on doxycycline and now has worsening kidney function.  has a referral for wound care in the outpatient but hasn't been yet. States might have been trying out some NSAIDs, started diclofenac bid on 11/10 Pt has history of hyperkalemia, as of 2 years ago.  Hyperkalemia, POA, resolved -suspect from AKI in the setting of ACE inhibitor, history of elevated potassiums in the past. --temporized, and given insulin/D50, lokelma and Kayexalate.  Had a large BM. --nephrology consulted. Plan: --Low potassium diet.  Avoid orange juice.  AKI on CKD 3B None anion gap metabolic acidosis --Cr 2.1 on presentation.  Baseline 1.6-1.8. --nephrology consulted Plan: --cont to hold Lisinopril --avoid NSAIDs --cont bicarb gtt at reduced rate of 75 ml/hr --IV lasix 60 mg x1, per nephrology   bradycardia  --brief episode in the ED to 28, PR prolongation.  Pt takes Lopressor at home. --HR trending up with Lopressor held Plan: --resume home Lopressor   Venous stasis ulcer --wound care consulted, rec ABI which showed at least moderate underlying peripheral arterial disease, therefore, unable to apply Unna boots. Plan: --ACE wrap for now --no need for abx   Morbid obesity, BMI 53 - encouraged weight loss  Normocytic anemia --Hgb  8's --anemia workup  Hx of DM  --not on hypoglycemics at home --ACHS and SSI for now  Hypoglycemia --maybe due to insulin administration for hyperkalemia --cont bicarb D5 infusion @75  --ACHS BG checks  Hx of migraine --cont home Topamax --can take home Aimovig if brought in from home   DVT prophylaxis: Lovenox SQ Code Status: Full code  Family Communication:  Level of care: Med-Surg Dispo:   The patient is from: home Anticipated d/c is to: PT rec SNF Anticipated d/c date is: likely tomorrow if pt declines SNF Patient currently is not medically ready to d/c due to: IVF for AKI   Subjective and Interval History:  Denied pain with her leg ulcer.    Had large BM last night.  Hyperkalemia resolved this morning.   Objective: Vitals:   01/06/21 1400 01/06/21 1500 01/06/21 1600 01/06/21 1703  BP: (!) 155/140  (!) 123/52 (!) 145/119  Pulse: 80 82 (!) 131 75  Resp: 18 17 14 18   Temp:   98.3 F (36.8 C) 98.6 F (37 C)  TempSrc:    Oral  SpO2: 100% 100% 100% 100%  Weight:      Height:        Intake/Output Summary (Last 24 hours) at 01/06/2021 1920 Last data filed at 01/06/2021 1600 Gross per 24 hour  Intake 93.25 ml  Output 3752 ml  Net -3658.75 ml   Filed Weights   01/05/21 1359 01/05/21 2051  Weight: (!) 158.8 kg (!) 163 kg    Examination:   Constitutional: NAD, AAOx3 HEENT:  conjunctivae and lids normal, EOMI CV: No cyanosis.   RESP: normal respiratory effort, on RA Extremities: non-pitting edema in BLE SKIN: warm, dry.  Quarter-sized superficial ulcer over left lower leg. Neuro: II - XII grossly intact.   Psych: Normal mood and affect.  Appropriate judgement and reason   Data Reviewed: I have personally reviewed following labs and imaging studies  CBC: Recent Labs  Lab 01/05/21 1400 01/06/21 0853  WBC 6.2 5.8  NEUTROABS 4.2  --   HGB 7.9* 8.0*  HCT 26.5* 26.9*  MCV 83.1 82.3  PLT 198 967   Basic Metabolic Panel: Recent Labs  Lab  01/05/21 1400 01/05/21 1800 01/05/21 1837 01/05/21 1854 01/06/21 0540 01/06/21 0853  NA 138 138  --   --  144 142  K 6.7* 6.2* 5.9*  --  6.1* 5.1  CL 119* 120*  --   --  122* 117*  CO2 16* 15*  --   --  14* 18*  GLUCOSE 100* 49*  --   --  89 97  BUN 43* 41*  --   --  38* 34*  CREATININE 2.11* 2.05*  --   --  1.81* 1.72*  CALCIUM 9.3 9.7  --   --  9.7 9.5  MG  --   --   --  1.9  --   --   PHOS  --   --   --  3.7  --   --    GFR: Estimated Creatinine Clearance: 55.4 mL/min (A) (by C-G formula based on SCr of 1.72 mg/dL (H)). Liver Function Tests: Recent Labs  Lab 01/05/21 1400 01/06/21 0540  AST 22 28  ALT 17 17  ALKPHOS 45 40  BILITOT 0.7 0.4  PROT 8.1 6.8  ALBUMIN 3.7 3.0*   No results for input(s): LIPASE, AMYLASE in the last 168 hours. No results for input(s): AMMONIA in the last 168 hours. Coagulation Profile: No results for input(s): INR, PROTIME in the last 168 hours. Cardiac Enzymes: Recent Labs  Lab 01/05/21 1854  CKTOTAL 413*   BNP (last 3 results) No results for input(s): PROBNP in the last 8760 hours. HbA1C: No results for input(s): HGBA1C in the last 72 hours. CBG: Recent Labs  Lab 01/06/21 0719 01/06/21 0740 01/06/21 0818 01/06/21 1231 01/06/21 1619  GLUCAP 68* 79 80 94 117*   Lipid Profile: No results for input(s): CHOL, HDL, LDLCALC, TRIG, CHOLHDL, LDLDIRECT in the last 72 hours. Thyroid Function Tests: Recent Labs    01/05/21 2128  TSH 2.023   Anemia Panel: No results for input(s): VITAMINB12, FOLATE, FERRITIN, TIBC, IRON, RETICCTPCT in the last 72 hours. Sepsis Labs: No results for input(s): PROCALCITON, LATICACIDVEN in the last 168 hours.  Recent Results (from the past 240 hour(s))  Aerobic Culture w Gram Stain (superficial specimen)     Status: None (Preliminary result)   Collection Time: 01/05/21  4:39 PM   Specimen: Wound  Result Value Ref Range Status   Specimen Description   Final    WOUND Performed at [facility], [address], [place], [place] [address]    Special Requests   Final    Normal Performed at [facility], [address] [place], [place] [address]    Gram Stain NO WBC SEEN NO ORGANISMS SEEN   Final   Culture   Final    CULTURE REINCUBATED FOR BETTER GROWTH Performed at [facility], [place] [address] [place], [place] [address]    Report  Status PENDING  Incomplete  Resp Panel by RT-PCR (Flu A&B, Covid) Nasopharyngeal Swab     Status: None   Collection Time: 01/05/21  5:15 PM   Specimen: Nasopharyngeal Swab; Nasopharyngeal(NP) swabs in vial transport medium  Result Value Ref Range Status   SARS Coronavirus 2 by RT PCR NEGATIVE NEGATIVE Final    Comment: (NOTE) SARS-CoV-2 target nucleic acids are NOT DETECTED.  The SARS-CoV-2 RNA is generally detectable in upper respiratory specimens during the acute phase of infection. The lowest concentration of SARS-CoV-2 viral copies this assay can detect is 138 copies/mL. A negative result does not preclude SARS-Cov-2 infection and should not be used as the sole basis for treatment or other patient management decisions. A negative result may occur with  improper specimen collection/handling, submission of specimen other than nasopharyngeal swab, presence of viral mutation(s) within the areas targeted by this assay, and inadequate number of viral copies(<138 copies/mL). A negative result must be combined with clinical observations, patient history, and epidemiological information. The expected result is Negative.  Fact Sheet for Patients:  EntrepreneurPulse.com.au  Fact Sheet for Healthcare Providers:  IncredibleEmployment.be  This test is no t yet approved or cleared by the [place] FDA and  has been authorized for detection and/or diagnosis of SARS-CoV-2 by FDA under an Emergency Use Authorization (EUA). This EUA will remain  in effect (meaning this test can be used) for  the duration of the COVID-19 declaration under Section 564(b)(1) of the Act, 21 U.S.C.section 360bbb-3(b)(1), unless the authorization is terminated  or revoked sooner.       Influenza A by PCR NEGATIVE NEGATIVE Final   Influenza B by PCR NEGATIVE NEGATIVE Final    Comment: (NOTE) The Xpert Xpress SARS-CoV-2/FLU/RSV plus assay is intended as an aid in the diagnosis of influenza from Nasopharyngeal swab specimens and should not be used as a sole basis for treatment. Nasal washings and aspirates are unacceptable for Xpert Xpress SARS-CoV-2/FLU/RSV testing.  Fact Sheet for Patients: EntrepreneurPulse.com.au  Fact Sheet for Healthcare Providers: IncredibleEmployment.be  This test is not yet approved or cleared by the [place] FDA and has been authorized for detection and/or diagnosis of SARS-CoV-2 by FDA under an Emergency Use Authorization (EUA). This EUA will remain in effect (meaning this test can be used) for the duration of the COVID-19 declaration under Section 564(b)(1) of the Act, 21 U.S.C. section 360bbb-3(b)(1), unless the authorization is terminated or revoked.  Performed at [facility], [address] [place], [place] [address]   CULTURE, BLOOD (ROUTINE X 2) w Reflex to ID Panel     Status: None (Preliminary result)   Collection Time: 01/05/21  9:26 PM   Specimen: BLOOD  Result Value Ref Range Status   Specimen Description BLOOD RIGHT ANTECUBITAL  Final   Special Requests   Final    BOTTLES DRAWN AEROBIC AND ANAEROBIC Blood Culture results may not be optimal due to an inadequate volume of blood received in culture bottles   Culture   Final    NO GROWTH < 12 HOURS Performed at [facility], [address] [place], [place] [address]    Report Status PENDING  Incomplete      Radiology Studies: US ARTERIAL ABI (SCREENING LOWER EXTREMITY)  Result Date: 01/06/2021 CLINICAL DATA:  Claudication EXAM:  NONINVASIVE PHYSIOLOGIC VASCULAR STUDY OF BILATERAL LOWER EXTREMITIES TECHNIQUE: Evaluation of both lower extremities were performed at rest, including calculation of ankle-brachial indices with single level Doppler, pressure and pulse volume recording. COMPARISON:  None.  FINDINGS: Right ABI:  0.78 Left ABI:  0.75 Right Lower Extremity:  Abnormal monophasic arterial waveforms. Left Lower Extremity: Abnormal monophasic and biphasic arterial waveforms. 0.5-0.79 Moderate PAD IMPRESSION: Abnormal bilateral resting ankle-brachial indices consistent with at least moderate underlying peripheral arterial disease. Signed, Criselda Peaches, MD, [facility] Radiology Electronically Signed   By: Jacqulynn Cadet M.D.   On: 01/06/2021 12:35     Scheduled Meds:  allopurinol  100 mg Oral Daily   Chlorhexidine Gluconate Cloth  6 each Topical Daily   collagenase   Topical Daily   dextrose  1 ampule Intravenous Once   dorzolamide  1 drop Right Eye BID   [START ON 01/07/2021] ferrous sulfate  325 mg Oral Q breakfast   heparin  5,000 Units Subcutaneous Q8H   insulin aspart  0-6 Units Subcutaneous TID WC   latanoprost  1 drop Right Eye QHS   metoprolol tartrate  25 mg Oral BID   rosuvastatin  10 mg Oral QPM   sodium bicarbonate       timolol  1 drop Both Eyes BID   topiramate  100 mg Oral Daily   Continuous Infusions:  sodium bicarbonate 150 mEq in D5W infusion 75 mL/hr at 01/06/21 1124     LOS: 0 days     Enzo Bi, MD [facility] If 7PM-7AM, please contact night-coverage 01/06/2021, 7:20 PM

## 2021-01-07 ENCOUNTER — Inpatient Hospital Stay: Payer: Medicare HMO

## 2021-01-07 LAB — BASIC METABOLIC PANEL WITH GFR
Anion gap: 7 (ref 5–15)
BUN: 31 mg/dL — ABNORMAL HIGH (ref 8–23)
CO2: 24 mmol/L (ref 22–32)
Calcium: 8.6 mg/dL — ABNORMAL LOW (ref 8.9–10.3)
Chloride: 111 mmol/L (ref 98–111)
Creatinine, Ser: 1.73 mg/dL — ABNORMAL HIGH (ref 0.44–1.00)
GFR, Estimated: 33 mL/min — ABNORMAL LOW
Glucose, Bld: 100 mg/dL — ABNORMAL HIGH (ref 70–99)
Potassium: 4.3 mmol/L (ref 3.5–5.1)
Sodium: 142 mmol/L (ref 135–145)

## 2021-01-07 LAB — CBC
HCT: 23.8 % — ABNORMAL LOW (ref 36.0–46.0)
Hemoglobin: 7.1 g/dL — ABNORMAL LOW (ref 12.0–15.0)
MCH: 23.8 pg — ABNORMAL LOW (ref 26.0–34.0)
MCHC: 29.8 g/dL — ABNORMAL LOW (ref 30.0–36.0)
MCV: 79.9 fL — ABNORMAL LOW (ref 80.0–100.0)
Platelets: 192 10*3/uL (ref 150–400)
RBC: 2.98 MIL/uL — ABNORMAL LOW (ref 3.87–5.11)
RDW: 16.8 % — ABNORMAL HIGH (ref 11.5–15.5)
WBC: 5.4 10*3/uL (ref 4.0–10.5)
nRBC: 0 % (ref 0.0–0.2)

## 2021-01-07 LAB — IRON AND TIBC
Iron: 31 ug/dL (ref 28–170)
Saturation Ratios: 15 % (ref 10.4–31.8)
TIBC: 203 ug/dL — ABNORMAL LOW (ref 250–450)
UIBC: 172 ug/dL

## 2021-01-07 LAB — GLUCOSE, CAPILLARY
Glucose-Capillary: 97 mg/dL (ref 70–99)
Glucose-Capillary: 128 mg/dL — ABNORMAL HIGH (ref 70–99)

## 2021-01-07 LAB — AEROBIC CULTURE W GRAM STAIN (SUPERFICIAL SPECIMEN)
Gram Stain: NONE SEEN
Special Requests: NORMAL

## 2021-01-07 LAB — VITAMIN B12: Vitamin B-12: 295 pg/mL (ref 180–914)

## 2021-01-07 LAB — FOLATE: Folate: 41 ng/mL (ref >5.9)

## 2021-01-07 LAB — MAGNESIUM: Magnesium: 1.6 mg/dL — ABNORMAL LOW (ref 1.7–2.4)

## 2021-01-07 MED ORDER — VITAMIN B-12 1000 MCG PO TABS
1000.0000 ug | ORAL_TABLET | Freq: Every day | ORAL | Status: DC
  Administered 2021-01-08 – 2021-01-09 (×2): 1000 ug via ORAL
  Filled 2021-01-07 (×2): qty 1

## 2021-01-07 MED ORDER — MAGNESIUM SULFATE 2 GM/50ML IV SOLN
2.0000 g | Freq: Once | INTRAVENOUS | Status: AC
  Administered 2021-01-07: 2 g via INTRAVENOUS
  Filled 2021-01-07: qty 50

## 2021-01-07 NOTE — Evaluation (Signed)
Occupational Therapy Evaluation Patient Details Name: Veronica Krueger MRN: [id number] DOB: June 19, 1955 Today's Date: 01/07/2021   History of Present Illness Veronica Krueger is a 16yoF who [facility] on [facility]21 after noting to have acute weakness and loss of independent transfers correlating with progressed pain and warmth to RLE. In ED new Right ankle ulcer is found. Has chronic Left ankle ulcer. Pt has been on ABX >2 weeks now has reduced kidney function, hyperkalemia. PMH: anemia, PVD, CAD, DM2, HTN, LUE weakness, BLE lymphedema.   Clinical Impression   Pt seen for OT evaluation this date. Prior to admission, pt was mod-independent for ADLs and functional transfers (using hemi-walker to transfer to/from wheelchair/BSC), living alone in a 1-story home alone. Pt reports that family assists with IADLs at baseline. Pt currently presents with decreased RLE sensation, decreased strength, and decreased activity tolerance. Due to these functional impairments, pt requires MIN A for bed mobility, SUPERVISION/SET-UP for seated UB ADLs, MAX A for seated LB dressing, and MAX A+2 for sit>stand transfers. During session, pt able to maintain static standing balance for 90 sec with MIN A+2 and hemi-walker however was unable to take lateral steps to perform step pivot transfer to recliner/BSC. Pt would benefit from additional skilled OT services to maximize return to PLOF and minimize risk of future falls, injury, caregiver burden, and readmission. Upon discharge, recommend SNF.      Recommendations for follow up therapy are one component of a multi-disciplinary discharge planning process, led by the attending physician.  Recommendations may be updated based on patient status, additional functional criteria and insurance authorization.   Follow Up Recommendations  Skilled nursing-short term rehab (<3 hours/day)    Assistance Recommended at Discharge Frequent or constant Supervision/Assistance  Functional  Status Assessment  Patient has had a recent decline in their functional status and demonstrates the ability to make significant improvements in function in a reasonable and predictable amount of time.  Equipment Recommendations  Other (comment) (defer to next venue of care)       Precautions / Restrictions Precautions Precautions: Fall Restrictions Weight Bearing Restrictions: No      Mobility Bed Mobility Overal bed mobility: Needs Assistance Bed Mobility: Supine to Sit;Sit to Supine     Supine to sit: Min guard Sit to supine: Min assist   General bed mobility comments: uses a gait belt at baseline to pull LLE into bed.    Transfers Overall transfer level: Needs assistance Equipment used: Hemi-walker Transfers: Sit to/from Stand Sit to Stand: Max assist;+2 physical assistance;From elevated surface           General transfer comment: Requires MAX A+2 for clearing hips from bed and steadying upon standing.      Balance Overall balance assessment: Needs assistance Sitting-balance support: No upper extremity supported;Feet supported Sitting balance-Leahy Scale: Good Sitting balance - Comments: Good sitting balance reaching within BOS at EOB   Standing balance support: Bilateral upper extremity supported;During functional activity Standing balance-Leahy Scale: Poor Standing balance comment: Requires MIN A+2 to maintain static standing balance for 90sec                           ADL either performed or assessed with clinical judgement   ADL Overall ADL's : Needs assistance/impaired  General ADL Comments: SUPERVISION/SET-UP for seated UB ADLs and MAX A for seated LB dressing     Vision Baseline Vision/History: 1 Wears glasses              Pertinent Vitals/Pain Pain Assessment: No/denies pain     Hand Dominance Right   Extremity/Trunk Assessment Upper Extremity Assessment Upper Extremity  Assessment: RUE deficits/detail;LUE deficits/detail RUE Deficits / Details: Unable to flex shoulder >45 degrees d/t arthritis. Strength elbow flex/ext 4/5 and grip strength 5/5 RUE Sensation: WNL LUE Deficits / Details: Unable to flex shoulder at baseline. Elbow flex/ext 3/5 and grip strength 5/5   Lower Extremity Assessment Lower Extremity Assessment: Generalized weakness (pt reporting no sensation in R thigh)       Communication Communication Communication: No difficulties   Cognition Arousal/Alertness: Awake/alert Behavior During Therapy: WFL for tasks assessed/performed Overall Cognitive Status: Within Functional Limits for tasks assessed                                       General Comments  b/l LE lymphadema, chronic left ankel ulcer, and new right ankle ulcer    Exercises Other Exercises Other Exercises: Educated pt on role of OT, POC, and d/c recommendations        Home Living Family/patient expects to be discharged to:: Private residence Living Arrangements: Alone Available Help at Discharge: Family (a variety of friends/family assist with IADL as needed regularly; pt does not drive.) Type of Home: House Home Access: Ramped entrance     Home Layout: One level     Bathroom Shower/Tub: Walk-in shower;Curtain (~3" step over)         Home Equipment: Wheelchair - manual;Toilet riser;BSC/3in1;Grab bars - tub/shower;Hand held shower head;Shower seat - built in;Other (comment)   Additional Comments: has a standard unadjustable bed, without rails; does transfets with RUE on HW bed to/from chair; RUE on BSO riser and WC arm; uses arm rails for WC to/from shower seat; has used a hinged AFO on Rt in remote past due to Rt foot drop.      Prior Functioning/Environment Prior Level of Function : Independent/Modified Independent;Needs assist             Mobility Comments: intermittent physical assist for transfers ADLs Comments: Mod-I bathing,  dressing, toileting. Performs grooming tasks seated        OT Problem List: Impaired balance (sitting and/or standing);Decreased activity tolerance;Impaired sensation      OT Treatment/Interventions: Self-care/ADL training;Therapeutic exercise;Energy conservation;DME and/or AE instruction;Therapeutic activities;Patient/family education;Balance training    OT Goals(Current goals can be found in the care plan section) Acute Rehab OT Goals Patient Stated Goal: to return home OT Goal Formulation: With patient Time For Goal Achievement: 01/21/21 Potential to Achieve Goals: Fair ADL Goals Pt Will Perform Upper Body Dressing: with set-up;sitting Pt Will Perform Lower Body Dressing: with min assist;with adaptive equipment;sitting/lateral leans Pt Will Transfer to Toilet: with mod assist;with +2 assist;stand pivot transfer;bedside commode  OT Frequency: Min 2X/week    AM-PAC OT "6 Clicks" Daily Activity     Outcome Measure Help from another person eating meals?: None Help from another person taking care of personal grooming?: A Little Help from another person toileting, which includes using toliet, bedpan, or urinal?: A Lot Help from another person bathing (including washing, rinsing, drying)?: A Lot Help from another person to put on and taking off regular upper body clothing?: A Little Help  from another person to put on and taking off regular lower body clothing?: A Lot 6 Click Score: 16   End of Session Equipment Utilized During Treatment: Gait belt;Other (comment) (hemi walker) Nurse Communication: Mobility status  Activity Tolerance: Patient tolerated treatment well Patient left: in bed;with call bell/phone within reach;with bed alarm set  OT Visit Diagnosis: Unsteadiness on feet (R26.81);Muscle weakness (generalized) (M62.81)                Time: 1137 (provider in room during session)-1225 OT Time Calculation (min): 48 min Charges:  OT General Charges $OT Visit: 1 Visit OT  Evaluation $OT Eval Moderate Complexity: 1 Mod OT Treatments $Self Care/Home Management : 23-37 mins  Fredirick Maudlin, OTR/L [place]

## 2021-01-07 NOTE — TOC Initial Note (Signed)
Transition of Care [facility]) - Initial/Assessment Note    Patient Details  Name: Veronica Krueger MRN: [id number] Date of Birth: 06-Aug-1955  Transition of Care [facility]) CM/SW Contact:    Eileen Stanford, LCSW Phone Number: 01/07/2021, 3:12 PM  Clinical Narrative:  CSW spoke with pt. Pt has decided she does not want to go to SNF and she would rather do therapy at home. Pt lives home alone. Pt is aware PT is recommending SNF. Pt states she would like a new wheelchair. Per PT we can not get the wheelchair she needs from [place]. Pt is declining a lift and any other DME.    [facility] will service.              Expected Discharge Plan: [place] Barriers to Discharge: No Barriers Identified   Patient Goals and CMS Choice Patient states their goals for this hospitalization and ongoing recovery are:: to go home   Choice offered to / list presented to : Patient  Expected Discharge Plan and Services Expected Discharge Plan: [place] Acute Care Choice: [place] arrangements for the past 2 months: Single Family Home                           HH Arranged: PT, OT HH Agency: [facility] Date [place]: 01/07/21 Time HH Agency Contacted: 22 Representative spoke with at [place]: cheryl  Prior Living Arrangements/Services Living arrangements for the past 2 months: [place] Lives with:: Self Patient language and need for interpreter reviewed:: Yes Do you feel safe going back to the place where you live?: Yes      Need for Family Participation in Patient Care: Yes (Comment) Care giver support system in place?: Yes (comment)   Criminal Activity/Legal Involvement Pertinent to Current Situation/Hospitalization: No - Comment as needed  Activities of Daily Living Home Assistive Devices/Equipment: Wheelchair, Grab bars in shower, Raised toilet seat with rails, Eyeglasses ADL Screening (condition at time of  admission) Patient's cognitive ability adequate to safely complete daily activities?: Yes Is the patient deaf or have difficulty hearing?: No Does the patient have difficulty seeing, even when wearing glasses/contacts?: No Does the patient have difficulty concentrating, remembering, or making decisions?: No Patient able to express need for assistance with ADLs?: Yes Does the patient have difficulty dressing or bathing?: No Independently performs ADLs?: Yes (appropriate for developmental age) Does the patient have difficulty walking or climbing stairs?: Yes Weakness of Legs: Both Weakness of Arms/Hands: Left  Permission Sought/Granted Permission sought to share information with : Family Supports    Share Information with NAME: ester     Permission granted to share info w Relationship: sister     Emotional Assessment Appearance:: Appears stated age Attitude/Demeanor/Rapport: Engaged Affect (typically observed): Accepting Orientation: : Oriented to Self, Oriented to Place, Oriented to  Time, Oriented to Situation Alcohol / Substance Use: Not Applicable Psych Involvement: No (comment)  Admission diagnosis:  Hyperkalemia [E87.5] Lower limb ulcer, ankle, right, limited to breakdown of skin ([place]) [L97.311] AKI (acute kidney injury) ([place]) [N17.9] Patient Active Problem List   Diagnosis Date Noted   AKI (acute kidney injury) ([place]) 01/06/2021   Hyperkalemia 01/05/2021   Microcytic anemia 02/09/2018   Diabetes ([place]) 02/08/2018   Essential hypertension, benign 02/08/2018   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 [place] [address] Phone: [phone] Fax: [phone]     Social Determinants of Health (SDOH) Interventions    Readmission Risk Interventions No flowsheet data found.

## 2021-01-07 NOTE — Progress Notes (Signed)
[facility]  ROUNDING NOTE   Subjective:   Moved out of ICU.  Potassium 4.3   Creatinine 1.73 (1.72)  UOP 1851mL - status post IV furosemide 60mg  x 1  Patient is no longer drinking orange juice.   Objective:  Vital signs in last 24 hours:  Temp:  [97.6 F (36.4 C)-98.8 F (37.1 C)] 97.6 F (36.4 C) (11/15 0759) Pulse Rate:  [73-131] 76 (11/15 0759) Resp:  [14-20] 18 (11/15 0759) BP: (112-155)/(41-140) 120/42 (11/15 0759) SpO2:  [99 %-100 %] 100 % (11/15 0759)  Weight change:  Filed Weights   01/05/21 1359 01/05/21 2051  Weight: (!) 158.8 kg (!) 163 kg    Intake/Output: I/O last 3 completed shifts: In: 1417.4 [P.O.:180; I.V.:1187.4; IV Piggyback:50] Out: 4402 [Urine:4400; Stool:2]   Intake/Output this shift:  No intake/output data recorded.  Physical Exam: General: NAD, laying in bed  Head: Normocephalic, atraumatic. Moist oral mucosal membranes  Eyes: Anicteric, PERRL  Neck: Supple, trachea midline  Lungs:  Clear to auscultation  Heart: Regular rate and rhythm  Abdomen:  Soft, nontender, obese  Extremities: peripheral edema.  Neurologic: Nonfocal, moving all four extremities  Skin: Bilateral lower extremity ulcers         Basic Metabolic Panel: Recent Labs  Lab 01/05/21 1400 01/05/21 1800 01/05/21 1837 01/05/21 1854 01/06/21 0540 01/06/21 0853 01/07/21 0807  NA 138 138  --   --  144 142 142  K 6.7* 6.2* 5.9*  --  6.1* 5.1 4.3  CL 119* 120*  --   --  122* 117* 111  CO2 16* 15*  --   --  14* 18* 24  GLUCOSE 100* 49*  --   --  89 97 100*  BUN 43* 41*  --   --  38* 34* 31*  CREATININE 2.11* 2.05*  --   --  1.81* 1.72* 1.73*  CALCIUM 9.3 9.7  --   --  9.7 9.5 8.6*  MG  --   --   --  1.9  --   --  1.6*  PHOS  --   --   --  3.7  --   --   --      Liver Function Tests: Recent Labs  Lab 01/05/21 1400 01/06/21 0540  AST 22 28  ALT 17 17  ALKPHOS 45 40  BILITOT 0.7 0.4  PROT 8.1 6.8  ALBUMIN 3.7 3.0*    No results for  input(s): LIPASE, AMYLASE in the last 168 hours. No results for input(s): AMMONIA in the last 168 hours.  CBC: Recent Labs  Lab 01/05/21 1400 01/06/21 0853 01/07/21 0807  WBC 6.2 5.8 5.4  NEUTROABS 4.2  --   --   HGB 7.9* 8.0* 7.1*  HCT 26.5* 26.9* 23.8*  MCV 83.1 82.3 79.9*  PLT 198 184 192     Cardiac Enzymes: Recent Labs  Lab 01/05/21 1854  CKTOTAL 413*     BNP: Invalid input(s): POCBNP  CBG: Recent Labs  Lab 01/06/21 1231 01/06/21 1619 01/06/21 1955 01/07/21 0801 01/07/21 1123  GLUCAP 94 117* 159* 90 128*     Microbiology: Results for orders placed or performed during the hospital encounter of 01/05/21  Aerobic Culture w Gram Stain (superficial specimen)     Status: Abnormal   Collection Time: 01/05/21  4:39 PM   Specimen: Wound  Result Value Ref Range Status   Specimen Description   Final    WOUND Performed at [facility], [place],  [place], [place] [address]    Special Requests   Final    Normal Performed at [facility], [place], [place] [address]    Gram Stain NO WBC SEEN NO ORGANISMS SEEN   Final   Culture (A)  Final    MULTIPLE ORGANISMS PRESENT, NONE PREDOMINANT NO GROUP A STREP (S.PYOGENES) ISOLATED NO STAPHYLOCOCCUS AUREUS ISOLATED Performed at [facility], [place] [address], [place], [place] [address]    Report Status 01/07/2021 FINAL  Final  Resp Panel by RT-PCR (Flu A&B, Covid) Nasopharyngeal Swab     Status: None   Collection Time: 01/05/21  5:15 PM   Specimen: Nasopharyngeal Swab; Nasopharyngeal(NP) swabs in vial transport medium  Result Value Ref Range Status   SARS Coronavirus 2 by RT PCR NEGATIVE NEGATIVE Final    Comment: (NOTE) SARS-CoV-2 target nucleic acids are NOT DETECTED.  The SARS-CoV-2 RNA is generally detectable in upper respiratory specimens during the acute phase of infection. The lowest concentration of SARS-CoV-2 viral copies this assay can detect is 138  copies/mL. A negative result does not preclude SARS-Cov-2 infection and should not be used as the sole basis for treatment or other patient management decisions. A negative result may occur with  improper specimen collection/handling, submission of specimen other than nasopharyngeal swab, presence of viral mutation(s) within the areas targeted by this assay, and inadequate number of viral copies(<138 copies/mL). A negative result must be combined with clinical observations, patient history, and epidemiological information. The expected result is Negative.  Fact Sheet for Patients:  EntrepreneurPulse.com.au  Fact Sheet for Healthcare Providers:  IncredibleEmployment.be  This test is no t yet approved or cleared by the [place] FDA and  has been authorized for detection and/or diagnosis of SARS-CoV-2 by FDA under an Emergency Use Authorization (EUA). This EUA will remain  in effect (meaning this test can be used) for the duration of the COVID-19 declaration under Section 564(b)(1) of the Act, 21 U.S.C.section 360bbb-3(b)(1), unless the authorization is terminated  or revoked sooner.       Influenza A by PCR NEGATIVE NEGATIVE Final   Influenza B by PCR NEGATIVE NEGATIVE Final    Comment: (NOTE) The Xpert Xpress SARS-CoV-2/FLU/RSV plus assay is intended as an aid in the diagnosis of influenza from Nasopharyngeal swab specimens and should not be used as a sole basis for treatment. Nasal washings and aspirates are unacceptable for Xpert Xpress SARS-CoV-2/FLU/RSV testing.  Fact Sheet for Patients: EntrepreneurPulse.com.au  Fact Sheet for Healthcare Providers: IncredibleEmployment.be  This test is not yet approved or cleared by the [place] FDA and has been authorized for detection and/or diagnosis of SARS-CoV-2 by FDA under an Emergency Use Authorization (EUA). This EUA will remain in effect (meaning  this test can be used) for the duration of the COVID-19 declaration under Section 564(b)(1) of the Act, 21 U.S.C. section 360bbb-3(b)(1), unless the authorization is terminated or revoked.  Performed at [facility], [address] [place], [place] [address]   CULTURE, BLOOD (ROUTINE X 2) w Reflex to ID Panel     Status: None (Preliminary result)   Collection Time: 01/05/21  9:26 PM   Specimen: BLOOD  Result Value Ref Range Status   Specimen Description BLOOD RIGHT ANTECUBITAL  Final   Special Requests   Final    BOTTLES DRAWN AEROBIC AND ANAEROBIC Blood Culture results may not be optimal due to an inadequate volume of blood received in culture bottles   Culture   Final  NO GROWTH < 12 HOURS Performed at [facility], [address] [place], [place] [address]    Report Status PENDING  Incomplete    Coagulation Studies: No results for input(s): LABPROT, INR in the last 72 hours.  Urinalysis: No results for input(s): COLORURINE, LABSPEC, PHURINE, GLUCOSEU, HGBUR, BILIRUBINUR, KETONESUR, PROTEINUR, UROBILINOGEN, NITRITE, LEUKOCYTESUR in the last 72 hours.  Invalid input(s): APPERANCEUR    Imaging: US ARTERIAL ABI (SCREENING LOWER EXTREMITY)  Result Date: 01/06/2021 CLINICAL DATA:  Claudication EXAM: NONINVASIVE PHYSIOLOGIC VASCULAR STUDY OF BILATERAL LOWER EXTREMITIES TECHNIQUE: Evaluation of both lower extremities were performed at rest, including calculation of ankle-brachial indices with single level Doppler, pressure and pulse volume recording. COMPARISON:  None. FINDINGS: Right ABI:  0.78 Left ABI:  0.75 Right Lower Extremity:  Abnormal monophasic arterial waveforms. Left Lower Extremity: Abnormal monophasic and biphasic arterial waveforms. 0.5-0.79 Moderate PAD IMPRESSION: Abnormal bilateral resting ankle-brachial indices consistent with at least moderate underlying peripheral arterial disease. Signed, Criselda Peaches, MD, [facility] Radiology Electronically Signed   By: Jacqulynn Cadet M.D.   On: 01/06/2021 12:35     Medications:      allopurinol  100 mg Oral Daily   Chlorhexidine Gluconate Cloth  6 each Topical Daily   collagenase   Topical Daily   dextrose  1 ampule Intravenous Once   dorzolamide  1 drop Right Eye BID   enoxaparin (LOVENOX) injection  0.5 mg/kg Subcutaneous Q24H   ferrous sulfate  325 mg Oral Q breakfast   latanoprost  1 drop Right Eye QHS   metoprolol tartrate  25 mg Oral BID   timolol  1 drop Both Eyes BID   topiramate  100 mg Oral Daily   acetaminophen  Assessment/ Plan:  Ms. Veronica Krueger is a 65 y.o. black female with diabetes mellitus type II, peripheral vascular disease, nonhealing leg ulcers, hypertension, coronary artery disease, obesity status post bariatric surgery, gout, migraines who is admitted to [facility] 01/05/2021 for Hyperkalemia [E87.5] Lower limb ulcer, ankle, right, limited to breakdown of skin ([place]) [L97.311] AKI (acute kidney injury) ([place]) [N17.9]  #1: Acute kidney injury on chronic kidney disease stage IIIB with hyperkalemia and metabolic acidosis:  Baseline creatinine of 1.77, GFR of 30 on 08/10/2018.  - Continue to hold lisinopril. - Low potassium diet. Avoid orange juice.  - Avoid nonsteroidal anti-inflammatory agents. Apparently patient was on meloxicam and celecoxib at home. - Discontinue bicarb infusion - hold diuretics for today.   #2. Hypertension: 120/42. with chronic kidney disease:  home regimen of lisinopril, hydrochlorothiazide, and metoprolol. Holding lisinopril and hydrochlorothiazide.  - Continue metoprolol.    #3. Diabetes mellitus type II with chronic kidney disease: history of proteinuria. Noninsulin dependent.  Hypoglycemia has resolved.    LOS: 1 Delia Slatten 11/15/20221:06 PM

## 2021-01-07 NOTE — Progress Notes (Signed)
PROGRESS NOTE    Veronica Krueger  ASN:[id number] DOB: [birth year] DOA: 01/05/2021 PCP: Mechele Claude, FNP  [phone]   Assessment & Plan:   Active Problems:   Hyperkalemia   AKI (acute kidney injury) ([place])   Veronica Krueger is a 65 y.o. female with a pertinent history of  Veronica Krueger is a 65 y.o. female who presents for an ulcer to the right lateral ankle that is nonhealing from home by EMS to [facility] ED.     She presented initially because of new right hip pain which her provider did diclofenac.  new ulceration to right lateral ankle, has chronic wound on left lateral ankle.  Has been on antibiotics for the last 20ish days, augmentin bactrim and now on doxycycline and now has worsening kidney function.  has a referral for wound care in the outpatient but hasn't been yet. States might have been trying out some NSAIDs, started diclofenac bid on 11/10 Pt has history of hyperkalemia, as of 2 years ago.  Hyperkalemia, POA, resolved -suspect from AKI in the setting of ACE inhibitor, history of elevated potassiums in the past. --temporized, and given insulin/D50, lokelma and Kayexalate.  Had a large BM. --nephrology consulted. Plan: --Low potassium diet.  Avoid orange juice.  AKI on CKD 3B None anion gap metabolic acidosis --Cr 2.1 on presentation.  Baseline 1.6-1.8. --started on bicarb D5 gtt --nephrology consulted Plan: --cont to hold Lisinopril --avoid NSAIDs --d/c MIVF   Bradycardia, resolved --brief episode in the ED to 28, PR prolongation.  Pt takes Lopressor at home. --HR trending up with Lopressor held. Plan: --cont home Lopressor   Venous stasis ulcer --1 superficial venous stasis ulcer on each lower leg.  Pt reported having taking abx for >20 days for the ulcer.  Ulcers do not looked infected. --wound care consulted, rec ABI which showed at least moderate underlying peripheral arterial disease, therefore, unable to apply Unna boots. Plan: --ACE wrap  for now --No need for abx   Morbid obesity, BMI 53 - encouraged weight loss  Chronic Normocytic anemia --Hgb trended down to 7.1 this morning. --anemia workup neg for iron and folate def.  Vit B12 on the low side at 295 --Pt neither refused nor accepted blood transfusion, despite me asking multiple times. Plan: --start Vit B12 suppl --Monitor Hgb  Hx of DM  --A1c 6.1.  not on hypoglycemics at home --BG within inpatient goal  --d/c BG checks and SSI  Hypoglycemia, resolved --maybe due to insulin administration for hyperkalemia --d/c D5 gtt today --d/c BG checks  Hx of migraine --cont home Topamax --can take home Aimovig if brought in from home  Right thigh numbness --Today, pt said was her main reason for presenting to the ED, however, this was not a problem mentioned by any provider until today, including myself. --given pt's seemingly acute weakness, will rule out stroke. Plan: --MRI brain    DVT prophylaxis: Lovenox SQ Code Status: Full code  Family Communication:  Level of care: Med-Surg Dispo:   The patient is from: home Anticipated d/c is to: home with [facility] PT/OT (pt refused SNF currently)  Anticipated d/c date is: likely tomorrow if Hgb stable  Patient currently is not medically ready to d/c due to: need to ensure Hgb doesn't continue to drop <7, and MRI brain pending   Subjective and Interval History:  Pt complained of right thigh numbness, which pt said was her main reason for presenting to the ED, however, this was not a problem mentioned by any  provider until today.  Pt is not back to her baseline and unable to stand and transfer from her wheelchair, however, currently pt is refusing SNF.  Blood transfusion offered today, however, pt did not accept.   Objective: Vitals:   01/06/21 1943 01/07/21 0412 01/07/21 0759 01/07/21 1512  BP: (!) 150/60 (!) 112/41 (!) 120/42 (!) 119/49  Pulse: 89 73 76 83  Resp: 18 20 18 18   Temp: 97.8 F (36.6 C) 98.8 F  (37.1 C) 97.6 F (36.4 C) 98.1 F (36.7 C)  TempSrc: Oral Oral    SpO2: 100% 99% 100% 98%  Weight:      Height:        Intake/Output Summary (Last 24 hours) at 01/07/2021 1644 Last data filed at 01/07/2021 1500 Gross per 24 hour  Intake 1324.11 ml  Output 1150 ml  Net 174.11 ml   Filed Weights   01/05/21 1359 01/05/21 2051  Weight: (!) 158.8 kg (!) 163 kg    Examination:   Constitutional: NAD, AAOx3 HEENT: conjunctivae and lids normal, EOMI CV: No cyanosis.   RESP: normal respiratory effort, on RA Extremities: non-pitting edema in BLE SKIN: warm, dry. One quarter-sized superficial ulcer over left lower leg.  One unroofed blister over the right lower leg. Neuro: II - XII grossly intact.     Data Reviewed: I have personally reviewed following labs and imaging studies  CBC: Recent Labs  Lab 01/05/21 1400 01/06/21 0853 01/07/21 0807  WBC 6.2 5.8 5.4  NEUTROABS 4.2  --   --   HGB 7.9* 8.0* 7.1*  HCT 26.5* 26.9* 23.8*  MCV 83.1 82.3 79.9*  PLT 198 184 562   Basic Metabolic Panel: Recent Labs  Lab 01/05/21 1400 01/05/21 1800 01/05/21 1837 01/05/21 1854 01/06/21 0540 01/06/21 0853 01/07/21 0807  NA 138 138  --   --  144 142 142  K 6.7* 6.2* 5.9*  --  6.1* 5.1 4.3  CL 119* 120*  --   --  122* 117* 111  CO2 16* 15*  --   --  14* 18* 24  GLUCOSE 100* 49*  --   --  89 97 100*  BUN 43* 41*  --   --  38* 34* 31*  CREATININE 2.11* 2.05*  --   --  1.81* 1.72* 1.73*  CALCIUM 9.3 9.7  --   --  9.7 9.5 8.6*  MG  --   --   --  1.9  --   --  1.6*  PHOS  --   --   --  3.7  --   --   --    GFR: Estimated Creatinine Clearance: 55.1 mL/min (A) (by C-G formula based on SCr of 1.73 mg/dL (H)). Liver Function Tests: Recent Labs  Lab 01/05/21 1400 01/06/21 0540  AST 22 28  ALT 17 17  ALKPHOS 45 40  BILITOT 0.7 0.4  PROT 8.1 6.8  ALBUMIN 3.7 3.0*   No results for input(s): LIPASE, AMYLASE in the last 168 hours. No results for input(s): AMMONIA in the last 168  hours. Coagulation Profile: No results for input(s): INR, PROTIME in the last 168 hours. Cardiac Enzymes: Recent Labs  Lab 01/05/21 1854  CKTOTAL 413*   BNP (last 3 results) No results for input(s): PROBNP in the last 8760 hours. HbA1C: Recent Labs    01/05/21 1854  HGBA1C 6.1*   CBG: Recent Labs  Lab 01/06/21 1231 01/06/21 1619 01/06/21 1955 01/07/21 0801 01/07/21 1123  GLUCAP 94 117* 159*  97 128*   Lipid Profile: No results for input(s): CHOL, HDL, LDLCALC, TRIG, CHOLHDL, LDLDIRECT in the last 72 hours. Thyroid Function Tests: Recent Labs    01/05/21 2128  TSH 2.023   Anemia Panel: Recent Labs    01/07/21 0807  VITAMINB12 295  FOLATE 41.0  TIBC 203*  IRON 31   Sepsis Labs: No results for input(s): PROCALCITON, LATICACIDVEN in the last 168 hours.  Recent Results (from the past 240 hour(s))  Aerobic Culture w Gram Stain (superficial specimen)     Status: Abnormal   Collection Time: 01/05/21  4:39 PM   Specimen: Wound  Result Value Ref Range Status   Specimen Description   Final    WOUND Performed at [facility], [address] [place], [place] [address]    Special Requests   Final    Normal Performed at [facility], [place], [place] [address]    Gram Stain NO WBC SEEN NO ORGANISMS SEEN   Final   Culture (A)  Final    MULTIPLE ORGANISMS PRESENT, NONE PREDOMINANT NO GROUP A STREP (S.PYOGENES) ISOLATED NO STAPHYLOCOCCUS AUREUS ISOLATED Performed at [facility], [place] [address] [place], [place] [address]    Report Status 01/07/2021 FINAL  Final  Resp Panel by RT-PCR (Flu A&B, Covid) Nasopharyngeal Swab     Status: None   Collection Time: 01/05/21  5:15 PM   Specimen: Nasopharyngeal Swab; Nasopharyngeal(NP) swabs in vial transport medium  Result Value Ref Range Status   SARS Coronavirus 2 by RT PCR NEGATIVE NEGATIVE Final    Comment: (NOTE) SARS-CoV-2 target nucleic acids are NOT DETECTED.  The  SARS-CoV-2 RNA is generally detectable in upper respiratory specimens during the acute phase of infection. The lowest concentration of SARS-CoV-2 viral copies this assay can detect is 138 copies/mL. A negative result does not preclude SARS-Cov-2 infection and should not be used as the sole basis for treatment or other patient management decisions. A negative result may occur with  improper specimen collection/handling, submission of specimen other than nasopharyngeal swab, presence of viral mutation(s) within the areas targeted by this assay, and inadequate number of viral copies(<138 copies/mL). A negative result must be combined with clinical observations, patient history, and epidemiological information. The expected result is Negative.  Fact Sheet for Patients:  EntrepreneurPulse.com.au  Fact Sheet for Healthcare Providers:  IncredibleEmployment.be  This test is no t yet approved or cleared by the [place] FDA and  has been authorized for detection and/or diagnosis of SARS-CoV-2 by FDA under an Emergency Use Authorization (EUA). This EUA will remain  in effect (meaning this test can be used) for the duration of the COVID-19 declaration under Section 564(b)(1) of the Act, 21 U.S.C.section 360bbb-3(b)(1), unless the authorization is terminated  or revoked sooner.       Influenza A by PCR NEGATIVE NEGATIVE Final   Influenza B by PCR NEGATIVE NEGATIVE Final    Comment: (NOTE) The Xpert Xpress SARS-CoV-2/FLU/RSV plus assay is intended as an aid in the diagnosis of influenza from Nasopharyngeal swab specimens and should not be used as a sole basis for treatment. Nasal washings and aspirates are unacceptable for Xpert Xpress SARS-CoV-2/FLU/RSV testing.  Fact Sheet for Patients: EntrepreneurPulse.com.au  Fact Sheet for Healthcare Providers: IncredibleEmployment.be  This test is not yet approved or  cleared by the [place] FDA and has been authorized for detection and/or diagnosis of SARS-CoV-2 by FDA under an Emergency Use Authorization (EUA). This EUA will remain in  effect (meaning this test can be used) for the duration of the COVID-19 declaration under Section 564(b)(1) of the Act, 21 U.S.C. section 360bbb-3(b)(1), unless the authorization is terminated or revoked.  Performed at [facility], [address] [place], [place] [address]   CULTURE, BLOOD (ROUTINE X 2) w Reflex to ID Panel     Status: None (Preliminary result)   Collection Time: 01/05/21  9:26 PM   Specimen: BLOOD  Result Value Ref Range Status   Specimen Description BLOOD RIGHT ANTECUBITAL  Final   Special Requests   Final    BOTTLES DRAWN AEROBIC AND ANAEROBIC Blood Culture results may not be optimal due to an inadequate volume of blood received in culture bottles   Culture   Final    NO GROWTH 2 DAYS Performed at [facility], [address] [place], [place] [address]    Report Status PENDING  Incomplete  CULTURE, BLOOD (ROUTINE X 2) w Reflex to ID Panel     Status: None (Preliminary result)   Collection Time: 01/06/21  1:42 PM   Specimen: BLOOD  Result Value Ref Range Status   Specimen Description BLOOD RIGHT ANTECUBITAL  Final   Special Requests   Final    BOTTLES DRAWN AEROBIC AND ANAEROBIC Blood Culture adequate volume   Culture   Final    NO GROWTH 1 DAY Performed at [facility], [address] [place], [place] [address]    Report Status PENDING  Incomplete      Radiology Studies: US ARTERIAL ABI (SCREENING LOWER EXTREMITY)  Result Date: 01/06/2021 CLINICAL DATA:  Claudication EXAM: NONINVASIVE PHYSIOLOGIC VASCULAR STUDY OF BILATERAL LOWER EXTREMITIES TECHNIQUE: Evaluation of both lower extremities were performed at rest, including calculation of ankle-brachial indices with single level Doppler, pressure and pulse volume recording. COMPARISON:  None.  FINDINGS: Right ABI:  0.78 Left ABI:  0.75 Right Lower Extremity:  Abnormal monophasic arterial waveforms. Left Lower Extremity: Abnormal monophasic and biphasic arterial waveforms. 0.5-0.79 Moderate PAD IMPRESSION: Abnormal bilateral resting ankle-brachial indices consistent with at least moderate underlying peripheral arterial disease. Signed, Criselda Peaches, MD, [place] [facility] Radiology Electronically Signed   By: Jacqulynn Cadet M.D.   On: 01/06/2021 12:35     Scheduled Meds:  allopurinol  100 mg Oral Daily   Chlorhexidine Gluconate Cloth  6 each Topical Daily   collagenase   Topical Daily   dextrose  1 ampule Intravenous Once   dorzolamide  1 drop Right Eye BID   enoxaparin (LOVENOX) injection  0.5 mg/kg Subcutaneous Q24H   ferrous sulfate  325 mg Oral Q breakfast   latanoprost  1 drop Right Eye QHS   metoprolol tartrate  25 mg Oral BID   timolol  1 drop Both Eyes BID   topiramate  100 mg Oral Daily   Continuous Infusions:     LOS: 1 day     Enzo Bi, MD [facility] If 7PM-7AM, please contact night-coverage 01/07/2021, 4:44 PM

## 2021-01-08 DIAGNOSIS — N179 Acute kidney failure, unspecified: Principal | ICD-10-CM

## 2021-01-08 DIAGNOSIS — D649 Anemia, unspecified: Secondary | ICD-10-CM

## 2021-01-08 LAB — CBC
HCT: 22.6 % — ABNORMAL LOW (ref 36.0–46.0)
Hemoglobin: 6.8 g/dL — ABNORMAL LOW (ref 12.0–15.0)
MCH: 24.3 pg — ABNORMAL LOW (ref 26.0–34.0)
MCHC: 30.1 g/dL (ref 30.0–36.0)
MCV: 80.7 fL (ref 80.0–100.0)
Platelets: 198 10*3/uL (ref 150–400)
RBC: 2.8 MIL/uL — ABNORMAL LOW (ref 3.87–5.11)
RDW: 16.6 % — ABNORMAL HIGH (ref 11.5–15.5)
WBC: 6.1 10*3/uL (ref 4.0–10.5)
nRBC: 0 % (ref 0.0–0.2)

## 2021-01-08 LAB — HEMOGLOBIN A1C
Hgb A1c MFr Bld: 5.1 % (ref 4.8–5.6)
Mean Plasma Glucose: 100 mg/dL

## 2021-01-08 LAB — BASIC METABOLIC PANEL
Anion gap: 5 (ref 5–15)
BUN: 38 mg/dL — ABNORMAL HIGH (ref 8–23)
CO2: 23 mmol/L (ref 22–32)
Calcium: 8.5 mg/dL — ABNORMAL LOW (ref 8.9–10.3)
Chloride: 113 mmol/L — ABNORMAL HIGH (ref 98–111)
Creatinine, Ser: 2.42 mg/dL — ABNORMAL HIGH (ref 0.44–1.00)
GFR, Estimated: 22 mL/min — ABNORMAL LOW (ref >60)
Glucose, Bld: 99 mg/dL (ref 70–99)
Potassium: 4.3 mmol/L (ref 3.5–5.1)
Sodium: 141 mmol/L (ref 135–145)

## 2021-01-08 LAB — HEMOGLOBIN AND HEMATOCRIT, BLOOD
HCT: 28.2 % — ABNORMAL LOW (ref 36.0–46.0)
Hemoglobin: 8.5 g/dL — ABNORMAL LOW (ref 12.0–15.0)

## 2021-01-08 LAB — MAGNESIUM: Magnesium: 1.9 mg/dL (ref 1.7–2.4)

## 2021-01-08 LAB — PREPARE RBC (CROSSMATCH)

## 2021-01-08 MED ORDER — SODIUM CHLORIDE 0.9% IV SOLUTION: Freq: Once | INTRAVENOUS | Status: AC

## 2021-01-08 NOTE — Progress Notes (Signed)
Provider notified via secure chat regarding patients BP being low in both right and left extremities, patient was asymptomatic after assessing. Per provider continue to monitor patient and if she becomes symptomatic notify her. Will continue to monitor patient.

## 2021-01-08 NOTE — Progress Notes (Signed)
Physical Therapy Treatment Patient Details Name: Veronica Krueger MRN: [id number] DOB: August 31, 1955 Today's Date: 01/08/2021   History of Present Illness Veronica Krueger is a 42yoF who comes to [facility] on 01/05/21 after noting to have acute weakness and loss of independent transfers correlating with progressed pain and warmth to RLE. In ED new Right ankle ulcer is found. Has chronic Left ankle ulcer. Pt has been on ABX >2 weeks now has reduced kidney function, hyperkalemia. PMH: anemia, PVD, CAD, DM2, HTN, LUE weakness, BLE lymphedema.    PT Comments    Pt in bed upon arrival, agreeable to session. MinA to EOB, elevated surface, setup, and supervision for STS from EOB x3. Recliner is too low compared to her WC at home, also not appropriate for habitus, hence SPT not performed this date. Pt reports confidence in her ability to stand pivot transfer at home based on her performance today. Significant improvement in strength this date. Left at EOB at EOS, encouraged to sit up. Issued a gait belt to allow pt to perform back to supine per her normal mobility patterns at home.     Recommendations for follow up therapy are one component of a multi-disciplinary discharge planning process, led by the attending physician.  Recommendations may be updated based on patient status, additional functional criteria and insurance authorization.  Follow Up Recommendations  Home health PT     Assistance Recommended at Discharge Intermittent Supervision/Assistance  Equipment Recommendations  None recommended by PT    Recommendations for Other Services       Precautions / Restrictions Precautions Precautions: Fall     Mobility  Bed Mobility Overal bed mobility: Needs Assistance Bed Mobility: Supine to Sit     Supine to sit: Min assist     General bed mobility comments: needs help due to poor friction coefficient on sheets her v home setup    Transfers Overall transfer level: Needs  assistance Equipment used: Hemi-walker Transfers: Sit to/from Stand Sit to Stand: Supervision;From elevated surface           General transfer comment: elevated bed, Left hand on recliner armrest, Rt hand on hemiwalker.    Ambulation/Gait                   Stairs             Wheelchair Mobility    Modified Rankin (Stroke Patients Only)       Balance                                            Cognition                                                Exercises      General Comments        Pertinent Vitals/Pain Pain Assessment: No/denies pain    Home Living                          Prior Function            PT Goals (current goals can now be found in the care plan section) Acute Rehab PT Goals Patient Stated Goal: regain baseline mobility with transfers PT Goal  Formulation: With patient Time For Goal Achievement: 01/20/21 Potential to Achieve Goals: Fair Progress towards PT goals: Progressing toward goals    Frequency    Min 2X/week      PT Plan Current plan remains appropriate    Co-evaluation              AM-PAC PT "6 Clicks" Mobility   Outcome Measure  Help needed turning from your back to your side while in a flat bed without using bedrails?: A Lot Help needed moving from lying on your back to sitting on the side of a flat bed without using bedrails?: A Lot Help needed moving to and from a bed to a chair (including a wheelchair)?: A Lot Help needed standing up from a chair using your arms (e.g., wheelchair or bedside chair)?: A Lot Help needed to walk in hospital room?: A Little Help needed climbing 3-5 steps with a railing? : Total 6 Click Score: 12    End of Session Equipment Utilized During Treatment: Gait belt Activity Tolerance: Patient tolerated treatment well;Patient limited by fatigue;Other (comment) Patient left: in bed;with call bell/phone within reach;Other  (comment) (Rt EOB) Nurse Communication: Mobility status PT Visit Diagnosis: Other abnormalities of gait and mobility (R26.89);Muscle weakness (generalized) (M62.81)     Time: 0177-9390 PT Time Calculation (min) (ACUTE ONLY): 34 min  Charges:  $Therapeutic Activity: 23-37 mins                    2:31 PM, 01/08/21 Etta Grandchild, PT, DPT Physical Therapist - [facility]  [phone] ([place])     Sanjuanita Condrey C 01/08/2021, 2:29 PM

## 2021-01-08 NOTE — Progress Notes (Signed)
[facility]  ROUNDING NOTE   Subjective:   No complaints this morning.   Creatinine 2.42 (1.73)  Hemoglobin 6.8  UOP recorded at 531mL  Objective:  Vital signs in last 24 hours:  Temp:  [98.1 F (36.7 C)-98.5 F (36.9 C)] 98.1 F (36.7 C) (11/16 0805) Pulse Rate:  [68-90] 68 (11/16 0805) Resp:  [18-20] 19 (11/16 0805) BP: (87-127)/(37-49) 127/47 (11/16 0805) SpO2:  [98 %-100 %] 100 % (11/16 0805)  Weight change:  Filed Weights   01/05/21 1359 01/05/21 2051  Weight: (!) 158.8 kg (!) 163 kg    Intake/Output: I/O last 3 completed shifts: In: 1564.1 [P.O.:420; I.V.:1144.1] Out: 1150 [Urine:1150]   Intake/Output this shift:  Total I/O In: 120 [P.O.:120] Out: -   Physical Exam: General: NAD, laying in bed  Head: Normocephalic, atraumatic. Moist oral mucosal membranes  Eyes: Anicteric, PERRL  Neck: Supple, trachea midline  Lungs:  Clear to auscultation  Heart: Regular rate and rhythm  Abdomen:  Soft, nontender, obese  Extremities: Bilateral lower extremity wounds. Bilateral lower extremity nonpitting edema  Neurologic: Nonfocal, moving all four extremities  Skin: Bilateral lower extremity ulcers         Basic Metabolic Panel: Recent Labs  Lab 01/05/21 1800 01/05/21 1837 01/05/21 1854 01/06/21 0540 01/06/21 0853 01/07/21 0807 01/08/21 0509  NA 138  --   --  144 142 142 141  K 6.2* 5.9*  --  6.1* 5.1 4.3 4.3  CL 120*  --   --  122* 117* 111 113*  CO2 15*  --   --  14* 18* 24 23  GLUCOSE 49*  --   --  89 97 100* 99  BUN 41*  --   --  38* 34* 31* 38*  CREATININE 2.05*  --   --  1.81* 1.72* 1.73* 2.42*  CALCIUM 9.7  --   --  9.7 9.5 8.6* 8.5*  MG  --   --  1.9  --   --  1.6* 1.9  PHOS  --   --  3.7  --   --   --   --      Liver Function Tests: Recent Labs  Lab 01/05/21 1400 01/06/21 0540  AST 22 28  ALT 17 17  ALKPHOS 45 40  BILITOT 0.7 0.4  PROT 8.1 6.8  ALBUMIN 3.7 3.0*    No results for input(s): LIPASE, AMYLASE in the  last 168 hours. No results for input(s): AMMONIA in the last 168 hours.  CBC: Recent Labs  Lab 01/05/21 1400 01/06/21 0853 01/07/21 0807 01/08/21 0509  WBC 6.2 5.8 5.4 6.1  NEUTROABS 4.2  --   --   --   HGB 7.9* 8.0* 7.1* 6.8*  HCT 26.5* 26.9* 23.8* 22.6*  MCV 83.1 82.3 79.9* 80.7  PLT 198 184 192 198     Cardiac Enzymes: Recent Labs  Lab 01/05/21 1854  CKTOTAL 413*     BNP: Invalid input(s): POCBNP  CBG: Recent Labs  Lab 01/06/21 1231 01/06/21 1619 01/06/21 1955 01/07/21 0801 01/07/21 1123  GLUCAP 94 117* 159* 69 128*     Microbiology: Results for orders placed or performed during the hospital encounter of 01/05/21  Aerobic Culture w Gram Stain (superficial specimen)     Status: Abnormal   Collection Time: 01/05/21  4:39 PM   Specimen: Wound  Result Value Ref Range Status   Specimen Description   Final    WOUND Performed at [facility], [address] [place], [place] [address]    Special Requests   Final    Normal Performed at [facility], [place], [place] [address]    Gram Stain NO WBC SEEN NO ORGANISMS SEEN   Final   Culture (A)  Final    MULTIPLE ORGANISMS PRESENT, NONE PREDOMINANT NO GROUP A STREP (S.PYOGENES) ISOLATED NO STAPHYLOCOCCUS AUREUS ISOLATED Performed at [facility], [place] [address], [place], [place] [address]    Report Status 01/07/2021 FINAL  Final  Resp Panel by RT-PCR (Flu A&B, Covid) Nasopharyngeal Swab     Status: None   Collection Time: 01/05/21  5:15 PM   Specimen: Nasopharyngeal Swab; Nasopharyngeal(NP) swabs in vial transport medium  Result Value Ref Range Status   SARS Coronavirus 2 by RT PCR NEGATIVE NEGATIVE Final    Comment: (NOTE) SARS-CoV-2 target nucleic acids are NOT DETECTED.  The SARS-CoV-2 RNA is generally detectable in upper respiratory specimens during the acute phase of infection. The lowest concentration of SARS-CoV-2 viral copies this assay can detect  is 138 copies/mL. A negative result does not preclude SARS-Cov-2 infection and should not be used as the sole basis for treatment or other patient management decisions. A negative result may occur with  improper specimen collection/handling, submission of specimen other than nasopharyngeal swab, presence of viral mutation(s) within the areas targeted by this assay, and inadequate number of viral copies(<138 copies/mL). A negative result must be combined with clinical observations, patient history, and epidemiological information. The expected result is Negative.  Fact Sheet for Patients:  EntrepreneurPulse.com.au  Fact Sheet for Healthcare Providers:  IncredibleEmployment.be  This test is no t yet approved or cleared by the [place] FDA and  has been authorized for detection and/or diagnosis of SARS-CoV-2 by FDA under an Emergency Use Authorization (EUA). This EUA will remain  in effect (meaning this test can be used) for the duration of the COVID-19 declaration under Section 564(b)(1) of the Act, 21 U.S.C.section 360bbb-3(b)(1), unless the authorization is terminated  or revoked sooner.       Influenza A by PCR NEGATIVE NEGATIVE Final   Influenza B by PCR NEGATIVE NEGATIVE Final    Comment: (NOTE) The Xpert Xpress SARS-CoV-2/FLU/RSV plus assay is intended as an aid in the diagnosis of influenza from Nasopharyngeal swab specimens and should not be used as a sole basis for treatment. Nasal washings and aspirates are unacceptable for Xpert Xpress SARS-CoV-2/FLU/RSV testing.  Fact Sheet for Patients: EntrepreneurPulse.com.au  Fact Sheet for Healthcare Providers: IncredibleEmployment.be  This test is not yet approved or cleared by the [place] FDA and has been authorized for detection and/or diagnosis of SARS-CoV-2 by FDA under an Emergency Use Authorization (EUA). This EUA will remain in effect  (meaning this test can be used) for the duration of the COVID-19 declaration under Section 564(b)(1) of the Act, 21 U.S.C. section 360bbb-3(b)(1), unless the authorization is terminated or revoked.  Performed at [facility], [address] [place], [place] [address]   CULTURE, BLOOD (ROUTINE X 2) w Reflex to ID Panel     Status: None (Preliminary result)   Collection Time: 01/05/21  9:26 PM   Specimen: BLOOD  Result Value Ref Range Status   Specimen Description BLOOD RIGHT ANTECUBITAL  Final   Special Requests   Final    BOTTLES DRAWN AEROBIC AND ANAEROBIC Blood Culture results may not be optimal due to an inadequate volume of blood received in culture bottles   Culture   Final  NO GROWTH 3 DAYS Performed at [facility], [address] [place], [place] [address]    Report Status PENDING  Incomplete  CULTURE, BLOOD (ROUTINE X 2) w Reflex to ID Panel     Status: None (Preliminary result)   Collection Time: 01/06/21  1:42 PM   Specimen: BLOOD  Result Value Ref Range Status   Specimen Description BLOOD RIGHT ANTECUBITAL  Final   Special Requests   Final    BOTTLES DRAWN AEROBIC AND ANAEROBIC Blood Culture adequate volume   Culture   Final    NO GROWTH 2 DAYS Performed at [facility], [address] [place],  [address]    Report Status PENDING  Incomplete    Coagulation Studies: No results for input(s): LABPROT, INR in the last 72 hours.  Urinalysis: No results for input(s): COLORURINE, LABSPEC, PHURINE, GLUCOSEU, HGBUR, BILIRUBINUR, KETONESUR, PROTEINUR, UROBILINOGEN, NITRITE, LEUKOCYTESUR in the last 72 hours.  Invalid input(s): APPERANCEUR    Imaging: MR BRAIN WO CONTRAST  Result Date: 01/07/2021 CLINICAL DATA:  Right thigh numbness and weakness. EXAM: MRI HEAD WITHOUT CONTRAST TECHNIQUE: Multiplanar, multiecho pulse sequences of the brain and surrounding structures were obtained without intravenous contrast. COMPARISON:  None.  FINDINGS: Brain: No acute infarct, mass effect or extra-axial collection. No acute or chronic hemorrhage. Normal white matter signal, parenchymal volume and CSF spaces. The midline structures are normal. Vascular: Major flow voids are preserved. Skull and upper cervical spine: Normal calvarium and skull base. Visualized upper cervical spine and soft tissues are normal. Sinuses/Orbits:No paranasal sinus fluid levels or advanced mucosal thickening. No mastoid or middle ear effusion. Normal orbits. IMPRESSION: Normal brain MRI. Electronically Signed   By: Ulyses Jarred M.D.   On: 01/07/2021 23:56   US ARTERIAL ABI (SCREENING LOWER EXTREMITY)  Result Date: 01/06/2021 CLINICAL DATA:  Claudication EXAM: NONINVASIVE PHYSIOLOGIC VASCULAR STUDY OF BILATERAL LOWER EXTREMITIES TECHNIQUE: Evaluation of both lower extremities were performed at rest, including calculation of ankle-brachial indices with single level Doppler, pressure and pulse volume recording. COMPARISON:  None. FINDINGS: Right ABI:  0.78 Left ABI:  0.75 Right Lower Extremity:  Abnormal monophasic arterial waveforms. Left Lower Extremity: Abnormal monophasic and biphasic arterial waveforms. 0.5-0.79 Moderate PAD IMPRESSION: Abnormal bilateral resting ankle-brachial indices consistent with at least moderate underlying peripheral arterial disease. Signed, Criselda Peaches, MD, [facility] Radiology Electronically Signed   By: Jacqulynn Cadet M.D.   On: 01/06/2021 12:35     Medications:      sodium chloride   Intravenous Once   allopurinol  100 mg Oral Daily   Chlorhexidine Gluconate Cloth  6 each Topical Daily   collagenase   Topical Daily   dextrose  1 ampule Intravenous Once   dorzolamide  1 drop Right Eye BID   ferrous sulfate  325 mg Oral Q breakfast   latanoprost  1 drop Right Eye QHS   metoprolol tartrate  25 mg Oral BID   timolol  1 drop Both Eyes BID   topiramate  100 mg Oral  Daily   vitamin B-12  1,000 mcg Oral Daily   acetaminophen  Assessment/ Plan:  Veronica Krueger is a 65 y.o. black female with diabetes mellitus type II, peripheral vascular disease, nonhealing leg ulcers, hypertension, coronary artery disease, obesity status post bariatric surgery, gout, migraines who is admitted to [facility] 01/05/2021 for Hyperkalemia [E87.5] Lower limb ulcer, ankle, right, limited to breakdown of skin ([place]) [L97.311] AKI (acute kidney injury) ([place]) [N17.9]  #  1: Acute kidney injury on chronic kidney disease stage IIIB with hyperkalemia and metabolic acidosis:  Baseline creatinine of 1.77, GFR of 30 on 08/10/2018.  - Continue to hold lisinopril. - Low potassium diet. Avoid orange juice.  - Avoid nonsteroidal anti-inflammatory agents. Apparently patient was on meloxicam and celecoxib at home. - Holding diuretics.  - if creatinine continues to rise, will restart IV fluids. Encourage PO intake.    #2. Hypertension: 127/47. with chronic kidney disease:  home regimen of lisinopril, hydrochlorothiazide, and metoprolol. Holding lisinopril and hydrochlorothiazide.  - Continue metoprolol.    #3. Diabetes mellitus type II with chronic kidney disease: history of proteinuria. Noninsulin dependent.  Hypoglycemia has resolved.    LOS: 2 Shannara Winbush 11/16/202211:48 AM

## 2021-01-08 NOTE — Progress Notes (Signed)
PROGRESS NOTE   HPI was taken from Dr. Francesco Sor: Veronica Krueger is a 65 y.o. female with a pertinent history of  Veronica Krueger is a 65 y.o. female who presents for an ulcer to the right lateral ankle that is nonhealing from home by EMS to [facility] ED.     She presented initially because of new right hip pain which her provider did diclofenac.  new ulceration to right lateral ankle, has chronic wound on left lateral ankle.  Has been on antibiotics for the last 20ish days, augmentin bactrim and now on doxycycline and now has worsening kidney function.  has a referral for wound care in the outpatient but hasn't been yet. States might have been trying out some NSAIDs, started diclofenac bid on 11/10 Pt has history of hyperkalemia, as of 2 years ago   137/64, 98%, 65 HR, RR 20, WBC 6.2, Hgb 7.9, PLT 198, NA 138, K6.7, CO2 16, creatinine 2.11 EKG with some peakin gof the twaves. Bmp improved to 6.2 potassium ED talked with nephrology. I spoke with Veronica Krueger for reassurance and felt that    Veronica Krueger  WSF:[id number] DOB: January 18, 1956 DOA: 01/05/2021 PCP: Mechele Claude, FNP  Assessment & Plan:   Active Problems:   Hyperkalemia   AKI (acute kidney injury) ([place])   Hyperkalemia: likely secondary to AKI in the setting of ACE inhibitor, & hx of elevated potassiums in the past. Resolved    AKI on CKDIIIb: Cr is trending up from day prior. Avoid nephrotoxic meds. Nephro following and recs apprec   None anion gap metabolic acidosis: resolved    Bradycardia: resolved. Restarted home dose of BB   Venous stasis ulcer: grossly does not look infect. Ulcer cx is growing multiple species, none predominant. No abxs needed acutely. Pt reported having taking abx for >20 days for the ulcer.  Continue w/ wound care   PAD: recent ABI which showed at least moderate underlying peripheral arterial disease, therefore, unable to apply Unna boots.   Morbid obesity: BMI 53. Complicates overall  care & prognosis    Chronic normocytic anemia: will transfuse 1 unit of pRBCs today as Hb 6.8. Repeat H&H 4 hours post transfusion. Vitamin B12 on low end of normal, continue on vitamin B12 supplement    DM2: well controlled, HbA1c 6.1. Diet controlled    Hypoglycemia: resolved   Hx of migraine: continue on home dose of topiramate    Right thigh numbness: etiology unclear. MRI brain is normal. PT recs HH     DVT prophylaxis: holding DVT prophylaxis as pt is receiving pRBCs transfusion today  Code Status: full  Family Communication:  Disposition Plan: likely d/c back home   Level of care: Med-Surg  Status is: Inpatient  Remains inpatient appropriate because: severity of illness, requiring 1 unit of pRBCs today as Hb 6.8. Cr is trending up as well but metabolic acidosis resolved   Consultants:  Nephro   Procedures:  Antimicrobials:    Subjective: Pt c/o malaise   Objective: Vitals:   01/07/21 2002 01/07/21 2003 01/08/21 0426 01/08/21 0805  BP: (!) 89/40 (!) 87/37 (!) 116/42 (!) 127/47  Pulse: 90 88 72 68  Resp: 20  20 19   Temp: 98.3 F (36.8 C)  98.5 F (36.9 C) 98.1 F (36.7 C)  TempSrc: Oral  Oral Oral  SpO2: 98%  100% 100%  Weight:      Height:        Intake/Output Summary (Last 24 hours) at 01/08/2021 0856 Last data  filed at 01/07/2021 2020 Gross per 24 hour  Intake 240 ml  Output 500 ml  Net -260 ml   Filed Weights   01/05/21 1359 01/05/21 2051  Weight: (!) 158.8 kg (!) 163 kg    Examination:  General exam: Appears calm and comfortable  Respiratory system: Clear to auscultation. Respiratory effort normal. Cardiovascular system: S1 & S2 +. No rubs, gallops or clicks.  Gastrointestinal system: Abdomen is obese, soft and nontender.  Normal bowel sounds heard. Central nervous system: Alert and oriented. Moves all extremities  Psychiatry: Judgement and insight appear normal. Flat mood and affect    Data Reviewed: I have personally reviewed  following labs and imaging studies  CBC: Recent Labs  Lab 01/05/21 1400 01/06/21 0853 01/07/21 0807 01/08/21 0509  WBC 6.2 5.8 5.4 6.1  NEUTROABS 4.2  --   --   --   HGB 7.9* 8.0* 7.1* 6.8*  HCT 26.5* 26.9* 23.8* 22.6*  MCV 83.1 82.3 79.9* 80.7  PLT 198 184 192 644   Basic Metabolic Panel: Recent Labs  Lab 01/05/21 1800 01/05/21 1837 01/05/21 1854 01/06/21 0540 01/06/21 0853 01/07/21 0807 01/08/21 0509  NA 138  --   --  144 142 142 141  K 6.2* 5.9*  --  6.1* 5.1 4.3 4.3  CL 120*  --   --  122* 117* 111 113*  CO2 15*  --   --  14* 18* 24 23  GLUCOSE 49*  --   --  89 97 100* 99  BUN 41*  --   --  38* 34* 31* 38*  CREATININE 2.05*  --   --  1.81* 1.72* 1.73* 2.42*  CALCIUM 9.7  --   --  9.7 9.5 8.6* 8.5*  MG  --   --  1.9  --   --  1.6* 1.9  PHOS  --   --  3.7  --   --   --   --    GFR: Estimated Creatinine Clearance: 39.4 mL/min (A) (by C-G formula based on SCr of 2.42 mg/dL (H)). Liver Function Tests: Recent Labs  Lab 01/05/21 1400 01/06/21 0540  AST 22 28  ALT 17 17  ALKPHOS 45 40  BILITOT 0.7 0.4  PROT 8.1 6.8  ALBUMIN 3.7 3.0*   No results for input(s): LIPASE, AMYLASE in the last 168 hours. No results for input(s): AMMONIA in the last 168 hours. Coagulation Profile: No results for input(s): INR, PROTIME in the last 168 hours. Cardiac Enzymes: Recent Labs  Lab 01/05/21 1854  CKTOTAL 413*   BNP (last 3 results) No results for input(s): PROBNP in the last 8760 hours. HbA1C: Recent Labs    01/05/21 1854  HGBA1C 6.1*   CBG: Recent Labs  Lab 01/06/21 1231 01/06/21 1619 01/06/21 1955 01/07/21 0801 01/07/21 1123  GLUCAP 94 117* 159* 97 128*   Lipid Profile: No results for input(s): CHOL, HDL, LDLCALC, TRIG, CHOLHDL, LDLDIRECT in the last 72 hours. Thyroid Function Tests: Recent Labs    01/05/21 2128  TSH 2.023   Anemia Panel: Recent Labs    01/07/21 0807  VITAMINB12 295  FOLATE 41.0  TIBC 203*  IRON 31   Sepsis Labs: No  results for input(s): PROCALCITON, LATICACIDVEN in the last 168 hours.  Recent Results (from the past 240 hour(s))  Aerobic Culture w Gram Stain (superficial specimen)     Status: Abnormal   Collection Time: 01/05/21  4:39 PM   Specimen: Wound  Result Value Ref Range Status  Specimen Description   Final    WOUND Performed at [facility], [address] [place], [place] [address]    Special Requests   Final    Normal Performed at [facility], [place], [place] [address]    Gram Stain NO WBC SEEN NO ORGANISMS SEEN   Final   Culture (A)  Final    MULTIPLE ORGANISMS PRESENT, NONE PREDOMINANT NO GROUP A STREP (S.PYOGENES) ISOLATED NO STAPHYLOCOCCUS AUREUS ISOLATED Performed at [facility], [place] [address] [place], [place] [address]    Report Status 01/07/2021 FINAL  Final  Resp Panel by RT-PCR (Flu A&B, Covid) Nasopharyngeal Swab     Status: None   Collection Time: 01/05/21  5:15 PM   Specimen: Nasopharyngeal Swab; Nasopharyngeal(NP) swabs in vial transport medium  Result Value Ref Range Status   SARS Coronavirus 2 by RT PCR NEGATIVE NEGATIVE Final    Comment: (NOTE) SARS-CoV-2 target nucleic acids are NOT DETECTED.  The SARS-CoV-2 RNA is generally detectable in upper respiratory specimens during the acute phase of infection. The lowest concentration of SARS-CoV-2 viral copies this assay can detect is 138 copies/mL. A negative result does not preclude SARS-Cov-2 infection and should not be used as the sole basis for treatment or other patient management decisions. A negative result may occur with  improper specimen collection/handling, submission of specimen other than nasopharyngeal swab, presence of viral mutation(s) within the areas targeted by this assay, and inadequate number of viral copies(<138 copies/mL). A negative result must be combined with clinical observations, patient history, and epidemiological information. The  expected result is Negative.  Fact Sheet for Patients:  EntrepreneurPulse.com.au  Fact Sheet for Healthcare Providers:  IncredibleEmployment.be  This test is no t yet approved or cleared by the [place] FDA and  has been authorized for detection and/or diagnosis of SARS-CoV-2 by FDA under an Emergency Use Authorization (EUA). This EUA will remain  in effect (meaning this test can be used) for the duration of the COVID-19 declaration under Section 564(b)(1) of the Act, 21 U.S.C.section 360bbb-3(b)(1), unless the authorization is terminated  or revoked sooner.       Influenza A by PCR NEGATIVE NEGATIVE Final   Influenza B by PCR NEGATIVE NEGATIVE Final    Comment: (NOTE) The Xpert Xpress SARS-CoV-2/FLU/RSV plus assay is intended as an aid in the diagnosis of influenza from Nasopharyngeal swab specimens and should not be used as a sole basis for treatment. Nasal washings and aspirates are unacceptable for Xpert Xpress SARS-CoV-2/FLU/RSV testing.  Fact Sheet for Patients: EntrepreneurPulse.com.au  Fact Sheet for Healthcare Providers: IncredibleEmployment.be  This test is not yet approved or cleared by the [place] FDA and has been authorized for detection and/or diagnosis of SARS-CoV-2 by FDA under an Emergency Use Authorization (EUA). This EUA will remain in effect (meaning this test can be used) for the duration of the COVID-19 declaration under Section 564(b)(1) of the Act, 21 U.S.C. section 360bbb-3(b)(1), unless the authorization is terminated or revoked.  Performed at [facility], [address] [place], [place] [address]   CULTURE, BLOOD (ROUTINE X 2) w Reflex to ID Panel     Status: None (Preliminary result)   Collection Time: 01/05/21  9:26 PM   Specimen: BLOOD  Result Value Ref Range Status   Specimen Description BLOOD RIGHT ANTECUBITAL  Final   Special Requests   Final     BOTTLES DRAWN AEROBIC AND ANAEROBIC Blood Culture results may not be optimal due  to an inadequate volume of blood received in culture bottles   Culture   Final    NO GROWTH 3 DAYS Performed at [facility], [address] [place], [place] [address]    Report Status PENDING  Incomplete  CULTURE, BLOOD (ROUTINE X 2) w Reflex to ID Panel     Status: None (Preliminary result)   Collection Time: 01/06/21  1:42 PM   Specimen: BLOOD  Result Value Ref Range Status   Specimen Description BLOOD RIGHT ANTECUBITAL  Final   Special Requests   Final    BOTTLES DRAWN AEROBIC AND ANAEROBIC Blood Culture adequate volume   Culture   Final    NO GROWTH 2 DAYS Performed at [facility], [address], [place], [place] [address]    Report Status PENDING  Incomplete         Radiology Studies: MR BRAIN WO CONTRAST  Result Date: 01/07/2021 CLINICAL DATA:  Right thigh numbness and weakness. EXAM: MRI HEAD WITHOUT CONTRAST TECHNIQUE: Multiplanar, multiecho pulse sequences of the brain and surrounding structures were obtained without intravenous contrast. COMPARISON:  None. FINDINGS: Brain: No acute infarct, mass effect or extra-axial collection. No acute or chronic hemorrhage. Normal white matter signal, parenchymal volume and CSF spaces. The midline structures are normal. Vascular: Major flow voids are preserved. Skull and upper cervical spine: Normal calvarium and skull base. Visualized upper cervical spine and soft tissues are normal. Sinuses/Orbits:No paranasal sinus fluid levels or advanced mucosal thickening. No mastoid or middle ear effusion. Normal orbits. IMPRESSION: Normal brain MRI. Electronically Signed   By: Ulyses Jarred M.D.   On: 01/07/2021 23:56   US ARTERIAL ABI (SCREENING LOWER EXTREMITY)  Result Date: 01/06/2021 CLINICAL DATA:  Claudication EXAM: NONINVASIVE PHYSIOLOGIC VASCULAR STUDY OF BILATERAL LOWER EXTREMITIES TECHNIQUE: Evaluation of both lower extremities  were performed at rest, including calculation of ankle-brachial indices with single level Doppler, pressure and pulse volume recording. COMPARISON:  None. FINDINGS: Right ABI:  0.78 Left ABI:  0.75 Right Lower Extremity:  Abnormal monophasic arterial waveforms. Left Lower Extremity: Abnormal monophasic and biphasic arterial waveforms. 0.5-0.79 Moderate PAD IMPRESSION: Abnormal bilateral resting ankle-brachial indices consistent with at least moderate underlying peripheral arterial disease. Signed, Criselda Peaches, MD, [facility] Electronically Signed   By: Jacqulynn Cadet M.D.   On: 01/06/2021 12:35        Scheduled Meds:  allopurinol  100 mg Oral Daily   Chlorhexidine Gluconate Cloth  6 each Topical Daily   collagenase   Topical Daily   dextrose  1 ampule Intravenous Once   dorzolamide  1 drop Right Eye BID   enoxaparin (LOVENOX) injection  0.5 mg/kg Subcutaneous Q24H   ferrous sulfate  325 mg Oral Q breakfast   latanoprost  1 drop Right Eye QHS   metoprolol tartrate  25 mg Oral BID   timolol  1 drop Both Eyes BID   topiramate  100 mg Oral Daily   vitamin B-12  1,000 mcg Oral Daily   Continuous Infusions:   LOS: 2 days    Time spent: 34 mins     Wyvonnia Dusky, MD [facility] Pager [phone]  If 7PM-7AM, please contact night-coverage 01/08/2021, 8:56 AM

## 2021-01-08 NOTE — Plan of Care (Signed)

## 2021-01-08 NOTE — Progress Notes (Signed)
PT Cancellation Note  Patient Details Name: Veronica Krueger MRN: [id number] DOB: 1955-10-17   Cancelled Treatment:      Three attempts to see patient this date for treatment, pt unavailable each time. Will continue to follow as able. Discussed with Dr. Billie Ruddy and CSW.   2:34 PM, 01/08/21 Etta Grandchild, PT, DPT Physical Therapist - [facility]  [phone] ([place])     [place] [facility] 01/08/2021, 2:33 PM

## 2021-01-08 NOTE — Plan of Care (Signed)
  Problem: Education: Goal: Knowledge of General Education information will improve Description: Including pain rating scale, medication(s)/side effects and non-pharmacologic comfort measures Outcome: Progressing   Problem: Health Behavior/Discharge Planning: Goal: Ability to manage health-related needs will improve Outcome: Progressing   Problem: Nutrition: Goal: Adequate nutrition will be maintained Outcome: Progressing   Problem: Elimination: Goal: Will not experience complications related to urinary retention Outcome: Progressing   Problem: Pain Managment: Goal: General experience of comfort will improve Outcome: Progressing   Problem: Safety: Goal: Ability to remain free from injury will improve Outcome: Progressing   Problem: Skin Integrity: Goal: Risk for impaired skin integrity will decrease Outcome: Progressing

## 2021-01-09 DIAGNOSIS — I878 Other specified disorders of veins: Secondary | ICD-10-CM

## 2021-01-09 LAB — TYPE AND SCREEN
ABO/RH(D): A POS
Antibody Screen: NEGATIVE
Unit division: 0

## 2021-01-09 LAB — CBC
HCT: 26.9 % — ABNORMAL LOW (ref 36.0–46.0)
Hemoglobin: 8.2 g/dL — ABNORMAL LOW (ref 12.0–15.0)
MCH: 25.2 pg — ABNORMAL LOW (ref 26.0–34.0)
MCHC: 30.5 g/dL (ref 30.0–36.0)
MCV: 82.5 fL (ref 80.0–100.0)
Platelets: 197 10*3/uL (ref 150–400)
RBC: 3.26 MIL/uL — ABNORMAL LOW (ref 3.87–5.11)
RDW: 16.2 % — ABNORMAL HIGH (ref 11.5–15.5)
WBC: 4.8 10*3/uL (ref 4.0–10.5)
nRBC: 0 % (ref 0.0–0.2)

## 2021-01-09 LAB — BASIC METABOLIC PANEL
Anion gap: 10 (ref 5–15)
BUN: 41 mg/dL — ABNORMAL HIGH (ref 8–23)
CO2: 20 mmol/L — ABNORMAL LOW (ref 22–32)
Calcium: 8.7 mg/dL — ABNORMAL LOW (ref 8.9–10.3)
Chloride: 110 mmol/L (ref 98–111)
Creatinine, Ser: 2 mg/dL — ABNORMAL HIGH (ref 0.44–1.00)
GFR, Estimated: 28 mL/min — ABNORMAL LOW (ref >60)
Glucose, Bld: 98 mg/dL (ref 70–99)
Potassium: 4.3 mmol/L (ref 3.5–5.1)
Sodium: 140 mmol/L (ref 135–145)

## 2021-01-09 LAB — BPAM RBC
Blood Product Expiration Date: 202212142359
ISSUE DATE / TIME: 202211161812
Unit Type and Rh: 6200

## 2021-01-09 LAB — MAGNESIUM: Magnesium: 1.8 mg/dL (ref 1.7–2.4)

## 2021-01-09 MED ORDER — CYANOCOBALAMIN 1000 MCG PO TABS: 1000.0000 ug | ORAL_TABLET | Freq: Every day | ORAL | 0 refills | Status: DC

## 2021-01-09 NOTE — Plan of Care (Signed)

## 2021-01-09 NOTE — Discharge Summary (Signed)
Physician Discharge Summary  Veronica Krueger JIR:[id number] DOB: 1956-02-19 DOA: 01/05/2021  PCP: Mechele Claude, FNP  Admit date: 01/05/2021 Discharge date: 01/09/2021  Time spent: 60 minutes  Recommendations for Outpatient Follow-up:  Follow-up PCP in 2 weeks  Discharge Diagnoses:  Active Problems:   Hyperkalemia   AKI (acute kidney injury) [facility])   Discharge Condition: Stable  Diet recommendation: Heart healthy diet  Filed Weights   01/05/21 1359 01/05/21 2051  Weight: (!) 158.8 kg (!) 163 kg    History of present illness:  65 year old female with history of constant right lateral ankle, presented with right hip pain, new ulceration of right lateral ankle, chronic wound on left lateral ankle.  Has been on antibiotics for past 20 days with Augmentin, Bactrim and doxycycline presents with worsening renal function.  She has referral to wound care center as outpatient.  In the ED she was found to be hyperkalemic with potassium of 6.2.  Nephrology was consulted.  Hospital Course:   Hyperkalemia -Resolved, likely acute kidney injury in setting of ACE inhibitor use  Acute kidney injury on CKD stage IIIb -Nephrology was consulted -Baseline creatinine has improved to 2.0 -At baseline  Venous stasis ulcer -Wound care was consulted during hospital stay -Continue wound care as outpatient with home health RN -She already has an appointment with wound care center outpatient  Peripheral arterial disease -Recent ABI showed moderate underlying peripheral arterial disease -UNNA boots could not be applied  Chronic normocytic anemia -S/p 1 unit PRBC for hemoglobin of 6.8 -Started on B12 supplementation for B12 level of 295 -We will continue B12 supplementation for 30 days  Diabetes mellitus type 2 -Hemoglobin A1c 6.1 -Diet controlled -Hypoglycemia has been resolved  History of migraine -Continue Topamax  Right thigh numbness -MRI brain was normal -PT recommends home  health PT   Procedures: None  Consultations: Nephrology  Discharge Exam: Vitals:   01/09/21 0522 01/09/21 0851  BP: (!) 107/48 (!) 133/54  Pulse: 68 74  Resp: 20 20  Temp: 98.2 F (36.8 C) (!) 97.5 F (36.4 C)  SpO2: 99% 100%    General: Appears in no acute distress Cardiovascular: S1-S2, regular, no murmur auscultated Respiratory: Clear to auscultation bilaterally  Discharge Instructions    Allergies as of 01/09/2021       Reactions   Penicillins Itching, Other (See Comments), Swelling   Has patient had a PCN reaction causing immediate rash, facial/tongue/throat swelling, SOB or lightheadedness with hypotension:  Has patient had a PCN reaction causing severe rash involving mucus membranes or skin necrosis:  Has patient had a PCN reaction that required hospitalization: Has patient had a PCN reaction occurring within the last 10 years: No If all of the above answers are "NO", then may proceed with Cephalosporin use. Other reaction(s): Other (See Comments), Other (See Comments) Has patient had a PCN reaction causing immediate rash, facial/tongue/throat swelling, SOB or lightheadedness with hypotension:  Has patient had a PCN reaction causing severe rash involving mucus membranes or skin necrosis:  Has patient had a PCN reaction that required hospitalization: Has patient had a PCN reaction occurring within the last 10 years: No If all of the above answers are "NO", then may proceed with Cephalosporin use. Has patient had a PCN reaction causing immediate rash, facial/tongue/throat swelling, SOB or lightheadedness with hypotension:  Has patient had a PCN reaction causing severe rash involving mucus membranes or skin necrosis:  Has patient had a PCN reaction that required hospitalization: Has patient had a PCN reaction occurring  within the last 10 years: No If all of the above answers are "NO", then may proceed with Cephalosporin use.   Gabapentin Other (See Comments),  Itching   Hair loss Hair loss Other reaction(s): Other (See Comments), Other (See Comments), Other (See Comments) Hair loss Hair loss Hair loss Hair loss Hair loss Hair loss Hair loss Hair loss Hair loss Hair loss        Medication List     STOP taking these medications    diclofenac 75 MG EC tablet Commonly known as: VOLTAREN   doxycycline 100 MG tablet Commonly known as: VIBRA-TABS   lisinopril-hydrochlorothiazide 10-12.5 MG tablet Commonly known as: ZESTORETIC       TAKE these medications    acetaminophen 500 MG tablet Commonly known as: TYLENOL Take 1,000 mg by mouth every 6 (six) hours as needed for moderate pain or headache.   Aimovig (140 MG Dose) 70 MG/ML Soaj Generic drug: Erenumab-aooe Inject 140 mg into the skin every 28 (twenty-eight) days.   allopurinol 100 MG tablet Commonly known as: ZYLOPRIM Take 100 mg by mouth daily.   calcium carbonate 1250 (500 Ca) MG chewable tablet Commonly known as: OS-CAL Chew 1 tablet by mouth 3 (three) times daily.   cyanocobalamin 1000 MCG tablet Take 1 tablet (1,000 mcg total) by mouth daily. Start taking on: January 10, 2021   dorzolamide 2 % ophthalmic solution Commonly known as: TRUSOPT Place 1 drop into the right eye 2 (two) times daily.   ferrous sulfate 325 (65 FE) MG tablet Take 325 mg by mouth daily.   latanoprost 0.005 % ophthalmic solution Commonly known as: XALATAN Place 1 drop into the right eye at bedtime.   metoprolol tartrate 25 MG tablet Commonly known as: LOPRESSOR Take 25 mg by mouth 2 (two) times daily.   rosuvastatin 10 MG tablet Commonly known as: CRESTOR Take 10 mg by mouth every evening. Patient admits to noncompliance with this medication   timolol 0.5 % ophthalmic solution Commonly known as: TIMOPTIC Place 1 drop into both eyes 2 (two) times daily.   topiramate 25 MG tablet Commonly known as: TOPAMAX Take 25 mg by mouth 2 (two) times daily.   Vitamin D  (Ergocalciferol) 1.25 MG (50000 UNIT) Caps capsule Commonly known as: DRISDOL Take 50,000 Units by mouth once a week.       Allergies  Allergen Reactions   Penicillins Itching, Other (See Comments) and Swelling    Has patient had a PCN reaction causing immediate rash, facial/tongue/throat swelling, SOB or lightheadedness with hypotension:  Has patient had a PCN reaction causing severe rash involving mucus membranes or skin necrosis:  Has patient had a PCN reaction that required hospitalization: Has patient had a PCN reaction occurring within the last 10 years: No If all of the above answers are "NO", then may proceed with Cephalosporin use.  Other reaction(s): Other (See Comments), Other (See Comments) Has patient had a PCN reaction causing immediate rash, facial/tongue/throat swelling, SOB or lightheadedness with hypotension:  Has patient had a PCN reaction causing severe rash involving mucus membranes or skin necrosis:  Has patient had a PCN reaction that required hospitalization: Has patient had a PCN reaction occurring within the last 10 years: No If all of the above answers are "NO", then may proceed with Cephalosporin use. Has patient had a PCN reaction causing immediate rash, facial/tongue/throat swelling, SOB or lightheadedness with hypotension:  Has patient had a PCN reaction causing severe rash involving mucus membranes or skin necrosis:  Has  patient had a PCN reaction that required hospitalization: Has patient had a PCN reaction occurring within the last 10 years: No If all of the above answers are "NO", then may proceed with Cephalosporin use.   Gabapentin Other (See Comments) and Itching    Hair loss  Hair loss Other reaction(s): Other (See Comments), Other (See Comments), Other (See Comments) Hair loss Hair loss Hair loss Hair loss Hair loss Hair loss Hair loss Hair loss Hair loss Hair loss       The results of significant diagnostics from this  hospitalization (including imaging, microbiology, ancillary and laboratory) are listed below for reference.    Significant Diagnostic Studies: MR BRAIN WO CONTRAST  Result Date: 01/07/2021 CLINICAL DATA:  Right thigh numbness and weakness. EXAM: MRI HEAD WITHOUT CONTRAST TECHNIQUE: Multiplanar, multiecho pulse sequences of the brain and surrounding structures were obtained without intravenous contrast. COMPARISON:  None. FINDINGS: Brain: No acute infarct, mass effect or extra-axial collection. No acute or chronic hemorrhage. Normal white matter signal, parenchymal volume and CSF spaces. The midline structures are normal. Vascular: Major flow voids are preserved. Skull and upper cervical spine: Normal calvarium and skull base. Visualized upper cervical spine and soft tissues are normal. Sinuses/Orbits:No paranasal sinus fluid levels or advanced mucosal thickening. No mastoid or middle ear effusion. Normal orbits. IMPRESSION: Normal brain MRI. Electronically Signed   By: Ulyses Jarred M.D.   On: 01/07/2021 23:56   US ARTERIAL ABI (SCREENING LOWER EXTREMITY)  Result Date: 01/06/2021 CLINICAL DATA:  Claudication EXAM: NONINVASIVE PHYSIOLOGIC VASCULAR STUDY OF BILATERAL LOWER EXTREMITIES TECHNIQUE: Evaluation of both lower extremities were performed at rest, including calculation of ankle-brachial indices with single level Doppler, pressure and pulse volume recording. COMPARISON:  None. FINDINGS: Right ABI:  0.78 Left ABI:  0.75 Right Lower Extremity:  Abnormal monophasic arterial waveforms. Left Lower Extremity: Abnormal monophasic and biphasic arterial waveforms. 0.5-0.79 Moderate PAD IMPRESSION: Abnormal bilateral resting ankle-brachial indices consistent with at least moderate underlying peripheral arterial disease. Signed, Criselda Peaches, MD, [facility] Radiology Electronically Signed   By: Jacqulynn Cadet M.D.   On: 01/06/2021 12:35     Microbiology: Recent Results (from the past 240 hour(s))  Aerobic Culture w Gram Stain (superficial specimen)     Status: Abnormal   Collection Time: 01/05/21  4:39 PM   Specimen: Wound  Result Value Ref Range Status   Specimen Description   Final    WOUND Performed at [facility], [address] [place], [place] [address]    Special Requests   Final    Normal Performed at [facility], [place], [place] [address]    Gram Stain NO WBC SEEN NO ORGANISMS SEEN   Final   Culture (A)  Final    MULTIPLE ORGANISMS PRESENT, NONE PREDOMINANT NO GROUP A STREP (S.PYOGENES) ISOLATED NO STAPHYLOCOCCUS AUREUS ISOLATED Performed at [facility], [place] [address], [place], [place] [address]    Report Status 01/07/2021 FINAL  Final  Resp Panel by RT-PCR (Flu A&B, Covid) Nasopharyngeal Swab     Status: None   Collection Time: 01/05/21  5:15 PM   Specimen: Nasopharyngeal Swab; Nasopharyngeal(NP) swabs in vial transport medium  Result Value Ref Range Status   SARS Coronavirus 2 by RT PCR NEGATIVE NEGATIVE Final    Comment: (NOTE) SARS-CoV-2 target nucleic acids are NOT DETECTED.  The SARS-CoV-2 RNA is generally detectable in upper respiratory specimens during the acute phase of infection. The lowest  concentration of SARS-CoV-2 viral copies this assay can detect is 138 copies/mL. A negative result does not preclude SARS-Cov-2 infection and should not be used as the sole basis for treatment or other patient management decisions. A negative result may occur with  improper specimen collection/handling, submission of specimen other than nasopharyngeal swab, presence of viral mutation(s) within the areas targeted by this assay, and inadequate number of viral copies(<138 copies/mL). A negative result must be combined with clinical observations, patient history, and epidemiological information. The expected result is Negative.  Fact Sheet for Patients:   EntrepreneurPulse.com.au  Fact Sheet for Healthcare Providers:  IncredibleEmployment.be  This test is no t yet approved or cleared by the [place] FDA and  has been authorized for detection and/or diagnosis of SARS-CoV-2 by FDA under an Emergency Use Authorization (EUA). This EUA will remain  in effect (meaning this test can be used) for the duration of the COVID-19 declaration under Section 564(b)(1) of the Act, 21 U.S.C.section 360bbb-3(b)(1), unless the authorization is terminated  or revoked sooner.       Influenza A by PCR NEGATIVE NEGATIVE Final   Influenza B by PCR NEGATIVE NEGATIVE Final    Comment: (NOTE) The Xpert Xpress SARS-CoV-2/FLU/RSV plus assay is intended as an aid in the diagnosis of influenza from Nasopharyngeal swab specimens and should not be used as a sole basis for treatment. Nasal washings and aspirates are unacceptable for Xpert Xpress SARS-CoV-2/FLU/RSV testing.  Fact Sheet for Patients: EntrepreneurPulse.com.au  Fact Sheet for Healthcare Providers: IncredibleEmployment.be  This test is not yet approved or cleared by the [place] FDA and has been authorized for detection and/or diagnosis of SARS-CoV-2 by FDA under an Emergency Use Authorization (EUA). This EUA will remain in effect (meaning this test can be used) for the duration of the COVID-19 declaration under Section 564(b)(1) of the Act, 21 U.S.C. section 360bbb-3(b)(1), unless the authorization is terminated or revoked.  Performed at [facility], [address] [place], [place] [address]   CULTURE, BLOOD (ROUTINE X 2) w Reflex to ID Panel     Status: None (Preliminary result)   Collection Time: 01/05/21  9:26 PM   Specimen: BLOOD  Result Value Ref Range Status   Specimen Description BLOOD RIGHT ANTECUBITAL  Final   Special Requests   Final    BOTTLES DRAWN AEROBIC AND ANAEROBIC Blood Culture  results may not be optimal due to an inadequate volume of blood received in culture bottles   Culture   Final    NO GROWTH 4 DAYS Performed at [facility], [address] [place], [place] [address]    Report Status PENDING  Incomplete  CULTURE, BLOOD (ROUTINE X 2) w Reflex to ID Panel     Status: None (Preliminary result)   Collection Time: 01/06/21  1:42 PM   Specimen: BLOOD  Result Value Ref Range Status   Specimen Description BLOOD RIGHT ANTECUBITAL  Final   Special Requests   Final    BOTTLES DRAWN AEROBIC AND ANAEROBIC Blood Culture adequate volume   Culture   Final    NO GROWTH 3 DAYS Performed at [facility], [address], [place], [place] [address]    Report Status PENDING  Incomplete     Labs: Basic Metabolic Panel: Recent Labs  Lab 01/05/21 1854 01/06/21 0540 01/06/21 0853 01/07/21 0807 01/08/21 0509 01/09/21 0539  NA  --  144 142 142 141 140  K  --  6.1* 5.1 4.3 4.3 4.3  CL  --  122* 117* 111 113* 110  CO2  --  14* 18* 24 23 20*  GLUCOSE  --  89 97 100* 99 98  BUN  --  38* 34* 31* 38* 41*  CREATININE  --  1.81* 1.72* 1.73* 2.42* 2.00*  CALCIUM  --  9.7 9.5 8.6* 8.5* 8.7*  MG 1.9  --   --  1.6* 1.9 1.8  PHOS 3.7  --   --   --   --   --    Liver Function Tests: Recent Labs  Lab 01/05/21 1400 01/06/21 0540  AST 22 28  ALT 17 17  ALKPHOS 45 40  BILITOT 0.7 0.4  PROT 8.1 6.8  ALBUMIN 3.7 3.0*   No results for input(s): LIPASE, AMYLASE in the last 168 hours. No results for input(s): AMMONIA in the last 168 hours. CBC: Recent Labs  Lab 01/05/21 1400 01/06/21 0853 01/07/21 0807 01/08/21 0509 01/08/21 2230 01/09/21 0539  WBC 6.2 5.8 5.4 6.1  --  4.8  NEUTROABS 4.2  --   --   --   --   --   HGB 7.9* 8.0* 7.1* 6.8* 8.5* 8.2*  HCT 26.5* 26.9* 23.8* 22.6* 28.2* 26.9*  MCV 83.1 82.3 79.9* 80.7  --  82.5  PLT 198 184 192 198  --  197   Cardiac Enzymes: Recent Labs  Lab 01/05/21 1854  CKTOTAL 413*   BNP: BNP (last 3  results) No results for input(s): BNP in the last 8760 hours.  ProBNP (last 3 results) No results for input(s): PROBNP in the last 8760 hours.  CBG: Recent Labs  Lab 01/06/21 1231 01/06/21 1619 01/06/21 1955 01/07/21 0801 01/07/21 1123  GLUCAP 94 117* 159* 97 128*       Signed:  Oswald Hillock MD.  [facility] 01/09/2021, 2:17 PM

## 2021-01-09 NOTE — TOC Transition Note (Signed)
Transition of Care [facility]) - CM/SW Discharge Note   Patient Details  Name: Veronica Krueger MRN: [id number] Date of Birth: 29-May-1955  Transition of [facility]) CM/SW Contact:  Beverly Sessions, RN Phone Number: 01/09/2021, 4:02 PM   Clinical Narrative:     Patient to discharge today Malachy Mood with [facility] notified of discharge EMS transport arrange.  Patient confirms that has the key to enter her homm  Final next level of care: Home w [place] Barriers to Discharge: Barriers Resolved   Patient Goals and CMS Choice Patient states their goals for this hospitalization and ongoing recovery are:: to go home   Choice offered to / list presented to : Patient  Discharge Placement                       Discharge Plan and Services     Post Acute Care Choice: Home Health                    HH Arranged: RN, PT, OT [facility] Agency: [place] Date [place]: 01/07/21 Time HH Agency Contacted: [id number] Representative spoke with at [place]: cheryl  Social Determinants of Health (SDOH) Interventions     Readmission Risk Interventions No flowsheet data found.

## 2021-01-09 NOTE — Care Management Important Message (Signed)
Important Message  Patient Details  Name: Veronica Krueger MRN: [id number] Date of Birth: December 22, 1955   Medicare Important Message Given:  Yes     Dannette Barbara 01/09/2021, 10:54 AM

## 2021-01-10 LAB — CULTURE, BLOOD (ROUTINE X 2): Culture: NO GROWTH

## 2021-01-11 LAB — CULTURE, BLOOD (ROUTINE X 2)
Culture: NO GROWTH
Special Requests: ADEQUATE

## 2021-01-14 DIAGNOSIS — I129 Hypertensive chronic kidney disease with stage 1 through stage 4 chronic kidney disease, or unspecified chronic kidney disease: Secondary | ICD-10-CM | POA: Diagnosis not present

## 2021-01-14 DIAGNOSIS — E11622 Type 2 diabetes mellitus with other skin ulcer: Secondary | ICD-10-CM | POA: Diagnosis not present

## 2021-01-14 DIAGNOSIS — R202 Paresthesia of skin: Secondary | ICD-10-CM | POA: Diagnosis not present

## 2021-01-14 DIAGNOSIS — I70248 Atherosclerosis of native arteries of left leg with ulceration of other part of lower left leg: Secondary | ICD-10-CM | POA: Diagnosis not present

## 2021-01-14 DIAGNOSIS — Z79891 Long term (current) use of opiate analgesic: Secondary | ICD-10-CM | POA: Diagnosis not present

## 2021-01-14 DIAGNOSIS — G43909 Migraine, unspecified, not intractable, without status migrainosus: Secondary | ICD-10-CM | POA: Diagnosis not present

## 2021-01-14 DIAGNOSIS — I872 Venous insufficiency (chronic) (peripheral): Secondary | ICD-10-CM | POA: Diagnosis not present

## 2021-01-14 DIAGNOSIS — Z79899 Other long term (current) drug therapy: Secondary | ICD-10-CM | POA: Diagnosis not present

## 2021-01-14 DIAGNOSIS — N1832 Chronic kidney disease, stage 3b: Secondary | ICD-10-CM | POA: Diagnosis not present

## 2021-01-14 DIAGNOSIS — D649 Anemia, unspecified: Secondary | ICD-10-CM | POA: Diagnosis not present

## 2021-01-14 DIAGNOSIS — E1122 Type 2 diabetes mellitus with diabetic chronic kidney disease: Secondary | ICD-10-CM | POA: Diagnosis not present

## 2021-01-14 DIAGNOSIS — L97822 Non-pressure chronic ulcer of other part of left lower leg with fat layer exposed: Secondary | ICD-10-CM | POA: Diagnosis not present

## 2021-01-14 DIAGNOSIS — N179 Acute kidney failure, unspecified: Secondary | ICD-10-CM | POA: Diagnosis not present

## 2021-01-14 DIAGNOSIS — L97321 Non-pressure chronic ulcer of left ankle limited to breakdown of skin: Secondary | ICD-10-CM | POA: Diagnosis not present

## 2021-01-14 DIAGNOSIS — E1151 Type 2 diabetes mellitus with diabetic peripheral angiopathy without gangrene: Secondary | ICD-10-CM | POA: Diagnosis not present

## 2021-01-14 DIAGNOSIS — D631 Anemia in chronic kidney disease: Secondary | ICD-10-CM | POA: Diagnosis not present

## 2021-01-14 DIAGNOSIS — L97311 Non-pressure chronic ulcer of right ankle limited to breakdown of skin: Secondary | ICD-10-CM | POA: Diagnosis not present

## 2021-01-14 DIAGNOSIS — L97821 Non-pressure chronic ulcer of other part of left lower leg limited to breakdown of skin: Secondary | ICD-10-CM | POA: Diagnosis not present

## 2021-01-28 DIAGNOSIS — H3589 Other specified retinal disorders: Secondary | ICD-10-CM | POA: Diagnosis not present

## 2021-01-28 DIAGNOSIS — Z961 Presence of intraocular lens: Secondary | ICD-10-CM | POA: Diagnosis not present

## 2021-01-28 DIAGNOSIS — E113213 Type 2 diabetes mellitus with mild nonproliferative diabetic retinopathy with macular edema, bilateral: Secondary | ICD-10-CM | POA: Diagnosis not present

## 2021-01-29 DIAGNOSIS — L97829 Non-pressure chronic ulcer of other part of left lower leg with unspecified severity: Secondary | ICD-10-CM | POA: Diagnosis not present

## 2021-01-29 DIAGNOSIS — I10 Essential (primary) hypertension: Secondary | ICD-10-CM | POA: Diagnosis not present

## 2021-01-29 DIAGNOSIS — E1165 Type 2 diabetes mellitus with hyperglycemia: Secondary | ICD-10-CM | POA: Diagnosis not present

## 2021-01-29 DIAGNOSIS — E782 Mixed hyperlipidemia: Secondary | ICD-10-CM | POA: Diagnosis not present

## 2021-03-03 ENCOUNTER — Other Ambulatory Visit: Payer: Self-pay

## 2021-03-03 ENCOUNTER — Encounter: Payer: Medicare HMO | Attending: Physician Assistant | Admitting: Physician Assistant

## 2021-03-03 DIAGNOSIS — I872 Venous insufficiency (chronic) (peripheral): Secondary | ICD-10-CM | POA: Diagnosis not present

## 2021-03-03 DIAGNOSIS — I890 Lymphedema, not elsewhere classified: Secondary | ICD-10-CM | POA: Diagnosis not present

## 2021-03-03 DIAGNOSIS — L97822 Non-pressure chronic ulcer of other part of left lower leg with fat layer exposed: Secondary | ICD-10-CM | POA: Diagnosis not present

## 2021-03-03 DIAGNOSIS — E1151 Type 2 diabetes mellitus with diabetic peripheral angiopathy without gangrene: Secondary | ICD-10-CM | POA: Insufficient documentation

## 2021-03-03 DIAGNOSIS — E11622 Type 2 diabetes mellitus with other skin ulcer: Secondary | ICD-10-CM | POA: Insufficient documentation

## 2021-03-03 DIAGNOSIS — M109 Gout, unspecified: Secondary | ICD-10-CM | POA: Insufficient documentation

## 2021-03-03 DIAGNOSIS — I10 Essential (primary) hypertension: Secondary | ICD-10-CM | POA: Insufficient documentation

## 2021-03-03 DIAGNOSIS — I7389 Other specified peripheral vascular diseases: Secondary | ICD-10-CM | POA: Diagnosis not present

## 2021-03-04 NOTE — Progress Notes (Signed)
Veronica, Krueger ([id number]) Visit Report for 03/03/2021 Allergy List Details Patient Name: Veronica Krueger, Veronica Krueger Date of Service: 03/03/2021 12:45 PM Medical Record Number: [id number] Patient Account Number: [id number] Date of Birth/Sex: 1955-11-22 (66 y.o. F) Treating RN: Levora Dredge Primary Care Markeeta Scalf: [place] [employer] Other Clinician: Referring Letica Giaimo: [place] [employer] Treating Shireen Rayburn/Extender: Jeri Cos Weeks in Treatment: 0 Allergies Active Allergies penicillin Reaction: itching swelling gabapentin Reaction: itching Allergy Notes Electronic Signature(s) Signed: 03/04/2021 8:37:56 AM By: Levora Dredge Entered By: Levora Dredge on 03/03/2021 13:08:02 Veronica Krueger ([id number]) -------------------------------------------------------------------------------- Arrival Information Details Patient Name: Veronica Krueger Date of Service: 03/03/2021 12:45 PM Medical Record Number: [id number] Patient Account Number: [id number] Date of Birth/Sex: Apr 11, 1955 (65 y.o. F) Treating RN: Levora Dredge Primary Care Mathew Storck: [place] Other Clinician: Referring Breckin Savannah: [place] Treating Vyom Brass/Extender: Skipper Cliche in Treatment: 0 Visit Information Patient Arrived: Wheel Chair Arrival Time: 12:48 Accompanied By: friend Transfer Assistance: EasyPivot Patient Lift Patient Identification Verified: Yes Secondary Verification Process Completed: Yes Patient Has Alerts: Yes Patient Alerts: diabetic type 2 ABI 01/06/21 L 0.75 R 0.7 Electronic Signature(s) Signed: 03/03/2021 3:42:51 PM By: Levora Dredge Previous Signature: 03/03/2021 3:41:36 PM Version By: Levora Dredge Entered By: Levora Dredge on 03/03/2021 15:42:51 Veronica Krueger ([id number]) -------------------------------------------------------------------------------- Clinic Level of Care Assessment Details Patient Name: Veronica Krueger Date of Service: 03/03/2021 12:45 PM Medical Record  Number: [id number] Patient Account Number: [id number] Date of Birth/Sex: 10-21-1955 (65 y.o. F) Treating RN: Levora Dredge Primary Care Nayshawn Mesta: [place] Other Clinician: Referring Noboru Bidinger: [place] Treating Sayra Frisby/Extender: Skipper Cliche in Treatment: 0 Clinic Level of Care Assessment Items TOOL 1 Quantity Score []  - Use when EandM and Procedure is performed on INITIAL visit 0 ASSESSMENTS - Nursing Assessment / Reassessment []  - General Physical Exam (combine w/ comprehensive assessment (listed just below) when performed on new 0 pt. evals) []  - 0 Comprehensive Assessment (HX, ROS, Risk Assessments, Wounds Hx, etc.) ASSESSMENTS - Wound and Skin Assessment / Reassessment []  - Dermatologic / Skin Assessment (not related to wound area) 0 ASSESSMENTS - Ostomy and/or Continence Assessment and Care []  - Incontinence Assessment and Management 0 []  - 0 Ostomy Care Assessment and Management (repouching, etc.) PROCESS - Coordination of Care []  - Simple Patient / Family Education for ongoing care 0 []  - 0 Complex (extensive) Patient / Family Education for ongoing care []  - 0 Staff obtains Programmer, systems, Records, Test Results / Process Orders []  - 0 Staff telephones HHA, Nursing Homes / Clarify orders / etc []  - 0 Routine Transfer to another Facility (non-emergent condition) []  - 0 Routine Hospital Admission (non-emergent condition) []  - 0 New Admissions / Biomedical engineer / Ordering NPWT, Apligraf, etc. []  - 0 Emergency Hospital Admission (emergent condition) PROCESS - Special Needs []  - Pediatric / Minor Patient Management 0 []  - 0 Isolation Patient Management []  - 0 Hearing / Language / Visual special needs []  - 0 Assessment of Community assistance (transportation, D/C planning, etc.) []  - 0 Additional assistance / Altered mentation []  - 0 Support Surface(s) Assessment (bed, cushion, seat, etc.) INTERVENTIONS - Miscellaneous []  - External ear exam  0 []  - 0 Patient Transfer (multiple staff / Civil Service fast streamer / Similar devices) []  - 0 Simple Staple / Suture removal (25 or less) []  - 0 Complex Staple / Suture removal (26 or more) []  - 0 Hypo/Hyperglycemic Management (do not check if billed separately) []  - 0 Ankle / Brachial Index (ABI) - do not check if billed separately Has the patient been seen at the hospital within  the last three years: Yes Total Score: 0 Level Of Care: ____ Veronica Krueger ([id number]) Electronic Signature(s) Signed: 03/04/2021 8:37:56 AM By: Levora Dredge Entered By: Levora Dredge on 03/03/2021 14:16:51 Veronica Krueger ([id number]) -------------------------------------------------------------------------------- Encounter Discharge Information Details Patient Name: Veronica Krueger Date of Service: 03/03/2021 12:45 PM Medical Record Number: [id number] Patient Account Number: [id number] Date of Birth/Sex: 1955/10/30 (65 y.o. F) Treating RN: Levora Dredge Primary Care Katrenia Alkins: [place] Other Clinician: Referring Naji Mehringer: [place] Treating Arseniy Toomey/Extender: Skipper Cliche in Treatment: 0 Encounter Discharge Information Items Post Procedure Vitals Discharge Condition: Stable Temperature (F): 97.8 Ambulatory Status: Wheelchair Pulse (bpm): 69 Discharge Destination: Home Respiratory Rate (breaths/min): 18 Transportation: Private Auto Blood Pressure (mmHg): 175/83 Accompanied By: friend Schedule Follow-up Appointment: Yes Clinical Summary of Care: Electronic Signature(s) Signed: 03/04/2021 8:37:56 AM By: Levora Dredge Entered By: Levora Dredge on 03/03/2021 14:26:13 Veronica Krueger ([id number]) -------------------------------------------------------------------------------- Lower Extremity Assessment Details Patient Name: Veronica Krueger Date of Service: 03/03/2021 12:45 PM Medical Record Number: [id number] Patient Account Number: [id number] Date of Birth/Sex: [birth year]  (65 y.o. F) Treating RN: Levora Dredge Primary Care Merville Hijazi: [place] Other Clinician: Referring Telford Archambeau: [place] [employer] Treating Shereena Berquist/Extender: Jeri Cos Weeks in Treatment: 0 Edema Assessment Assessed: [Left: No] [Right: No] Edema: [Left: Ye] [Right: s] Calf Left: Right: Point of Measurement: 32 cm From Medial Instep 58.5 cm Ankle Left: Right: Point of Measurement: 9 cm From Medial Instep 43 cm Vascular Assessment Pulses: Dorsalis Pedis Doppler Audible: [Left:Yes] Posterior Tibial Doppler Audible: [Left:Yes] Electronic Signature(s) Signed: 03/04/2021 8:37:56 AM By: Levora Dredge Entered By: Levora Dredge on 03/03/2021 13:29:21 Veronica Krueger ([id number]) -------------------------------------------------------------------------------- Multi Wound Chart Details Patient Name: Veronica Krueger Date of Service: 03/03/2021 12:45 PM Medical Record Number: [id number] Patient Account Number: [id number] Date of Birth/Sex: [birth year] (65 y.o. F) Treating RN: Levora Dredge Primary Care Kynnedi Zweig: [place] Other Clinician: Referring Nuri Larmer: [place] Treating Dnaiel Voller/Extender: Skipper Cliche in Treatment: 0 Vital Signs Height(in): 69 Pulse(bpm): 65 Weight(lbs): Blood Pressure(mmHg): 175/83 Body Mass Index(BMI): Temperature(F): 97.8 Respiratory Rate(breaths/min): 18 Photos: [N/A:N/A] Wound Location: Left, Lateral Lower Leg N/A N/A Wounding Event: Blister N/A N/A Primary Etiology: Diabetic Wound/Ulcer of the Lower N/A N/A Extremity Comorbid History: Glaucoma, Anemia, Lymphedema, N/A N/A Hypertension, Type II Diabetes, Gout Date Acquired: 12/04/2020 N/A N/A Weeks of Treatment: 0 N/A N/A Wound Status: Open N/A N/A Measurements L x W x D (cm) 4.4x6.2x0.2 N/A N/A Area (cm) : 21.426 N/A N/A Volume (cm) : 4.285 N/A N/A Classification: Grade 1 N/A N/A Exudate Amount: Medium N/A N/A Exudate Type: Serosanguineous N/A N/A Exudate  Color: red, brown N/A N/A Granulation Amount: Large (67-100%) N/A N/A Granulation Quality: Red, Hyper-granulation N/A N/A Necrotic Amount: Small (1-33%) N/A N/A Exposed Structures: Fat Layer (Subcutaneous Tissue): N/A N/A Yes Epithelialization: None N/A N/A Treatment Notes Electronic Signature(s) Signed: 03/03/2021 1:46:54 PM By: Levora Dredge Entered By: Levora Dredge on 03/03/2021 13:46:54 Veronica Krueger ([id number]) -------------------------------------------------------------------------------- Multi-Disciplinary Care Plan Details Patient Name: Veronica Krueger Date of Service: 03/03/2021 12:45 PM Medical Record Number: [id number] Patient Account Number: [id number] Date of Birth/Sex: Jun 21, 1955 (65 y.o. F) Treating RN: Levora Dredge Primary Care Tatumn Corbridge: [place] Other Clinician: Referring Ziyon Cedotal: [place] [employer] Treating Kynley Metzger/Extender: Skipper Cliche in Treatment: 0 Active Inactive Wound/Skin Impairment Nursing Diagnoses: Impaired tissue integrity Knowledge deficit related to ulceration/compromised skin integrity Goals: Patient will have a decrease in wound volume by X% from date: (specify in notes) Date Initiated: 03/03/2021 Target Resolution Date: 03/31/2021 Goal Status: Active Patient/caregiver will verbalize understanding of skin care regimen Date Initiated: 03/03/2021  Target Resolution Date: 03/31/2021 Goal Status: Active Ulcer/skin breakdown will have a volume reduction of 30% by week 4 Date Initiated: 03/03/2021 Target Resolution Date: 04/28/2021 Goal Status: Active Ulcer/skin breakdown will have a volume reduction of 50% by week 8 Date Initiated: 03/03/2021 Target Resolution Date: 05/26/2021 Goal Status: Active Ulcer/skin breakdown will have a volume reduction of 80% by week 12 Date Initiated: 03/03/2021 Target Resolution Date: 06/23/2021 Goal Status: Active Ulcer/skin breakdown will heal within 14 weeks Date Initiated: 03/03/2021 Target Resolution  Date: 07/07/2021 Goal Status: Active Interventions: Assess patient/caregiver ability to obtain necessary supplies Assess patient/caregiver ability to perform ulcer/skin care regimen upon admission and as needed Assess ulceration(s) every visit Provide education on ulcer and skin care Notes: Electronic Signature(s) Signed: 03/03/2021 1:46:22 PM By: Levora Dredge Entered By: Levora Dredge on 03/03/2021 13:46:21 Veronica Krueger ([id number]) -------------------------------------------------------------------------------- Pain Assessment Details Patient Name: Veronica Krueger Date of Service: 03/03/2021 12:45 PM Medical Record Number: [id number] Patient Account Number: [id number] Date of Birth/Sex: Aug 04, 1955 (66 y.o. F) Treating RN: Levora Dredge Primary Care Lotus Santillo: [place] Other Clinician: Referring Grier Czerwinski: [place] [employer] Treating Justiss Gerbino/Extender: Skipper Cliche in Treatment: 0 Active Problems Location of Pain Severity and Description of Pain Patient Has Paino Yes Site Locations Rate the pain. Current Pain Level: 3 Pain Management and Medication Current Pain Management: Notes pt states burning pain at wound site Electronic Signature(s) Signed: 03/04/2021 8:37:56 AM By: Levora Dredge Entered By: Levora Dredge on 03/03/2021 13:06:15 Veronica Krueger ([id number]) -------------------------------------------------------------------------------- Patient/Caregiver Education Details Patient Name: Veronica Krueger Date of Service: 03/03/2021 12:45 PM Medical Record Number: [id number] Patient Account Number: [id number] Date of Birth/Gender: 1955/10/02 (65 y.o. F) Treating RN: Levora Dredge Primary Care Physician: [place] [employer] Other Clinician: Referring Physician: [place] [employer] Treating Physician/Extender: Skipper Cliche in Treatment: 0 Education Assessment Education Provided To: Patient Education Topics Provided Wound/Skin Impairment: Handouts:  Caring for Your Ulcer Methods: Explain/Verbal Responses: State content correctly Electronic Signature(s) Signed: 03/04/2021 8:37:56 AM By: Levora Dredge Entered By: Levora Dredge on 03/03/2021 14:17:21 Veronica Krueger ([id number]) -------------------------------------------------------------------------------- Wound Assessment Details Patient Name: Veronica Krueger Date of Service: 03/03/2021 12:45 PM Medical Record Number: [id number] Patient Account Number: [id number] Date of Birth/Sex: 02/24/1955 (65 y.o. F) Treating RN: Levora Dredge Primary Care Seville Brick: [place] [employer] Other Clinician: Referring Reynaldo Rossman: [place] [employer] Treating Daryel Kenneth/Extender: Jeri Cos Weeks in Treatment: 0 Wound Status Wound Number: 1 Primary Diabetic Wound/Ulcer of the Lower Extremity Etiology: Wound Location: Left, Lateral Lower Leg Wound Status: Open Wounding Event: Blister Comorbid Glaucoma, Anemia, Lymphedema, Hypertension, Type Date Acquired: 12/04/2020 History: II Diabetes, Gout Weeks Of Treatment: 0 Clustered Wound: No Photos Wound Measurements Length: (cm) 4.4 Width: (cm) 6.2 Depth: (cm) 0.2 Area: (cm) 21.426 Volume: (cm) 4.285 % Reduction in Area: % Reduction in Volume: Epithelialization: None Tunneling: No Undermining: No Wound Description Classification: Grade 1 Exudate Amount: Medium Exudate Type: Serosanguineous Exudate Color: red, brown Foul Odor After Cleansing: No Slough/Fibrino Yes Wound Bed Granulation Amount: Large (67-100%) Exposed Structure Granulation Quality: Red, Hyper-granulation Fat Layer (Subcutaneous Tissue) Exposed: Yes Necrotic Amount: Small (1-33%) Necrotic Quality: Adherent Slough Treatment Notes Wound #1 (Lower Leg) Wound Laterality: Left, Lateral Cleanser Peri-Wound Care Topical Santyl Collagenase Ointment, 30 (gm), tube Discharge Instruction: apply nickel thick to wound bed only Primary Dressing MEHA, VIDRINE  ([id number]) Hydrofera Blue Ready Transfer Foam, 2.5x2.5 (in/in) Discharge Instruction: Apply Hydrofera Blue Ready to wound bed as directed Secondary Dressing Zetuvit Plus Silicone Border Dressing 5x5 (in/in) Secured With Tubigrip Size G, 4.5x10 (in/yd) Discharge Instruction: Apply Tubigrip G 3-finger-widths below knee  to base of toes to secure dressing and/or for swelling. Compression Wrap Compression Stockings Add-Ons Electronic Signature(s) Signed: 03/04/2021 8:37:56 AM By: Levora Dredge Entered By: Levora Dredge on 03/03/2021 13:26:43 JERI, JEANBAPTISTE ([id number]) -------------------------------------------------------------------------------- Vitals Details Patient Name: Veronica Krueger Date of Service: 03/03/2021 12:45 PM Medical Record Number: [id number] Patient Account Number: [id number] Date of Birth/Sex: 1955/12/30 (66 y.o. F) Treating RN: Levora Dredge Primary Care Taresa Montville: [place] [employer] Other Clinician: Referring Hanz Winterhalter: [place] [employer] Treating Kiarah Eckstein/Extender: Skipper Cliche in Treatment: 0 Vital Signs Time Taken: 13:06 Temperature (F): 97.8 Height (in): 69 Pulse (bpm): 69 Source: Stated Respiratory Rate (breaths/min): 18 Unable to obtain height and weight: Medical Reason Blood Pressure (mmHg): 175/83 Reference Range: 80 - 120 mg / dl Notes pt unable to stand up on to scale Electronic Signature(s) Signed: 03/04/2021 8:37:56 AM By: Levora Dredge Entered By: Levora Dredge on 03/03/2021 13:07:20

## 2021-03-04 NOTE — Progress Notes (Signed)
CORYNN, SOLBERG ([id number]) Visit Report for 03/03/2021 Chief Complaint Document Details Patient Name: Veronica Krueger, Veronica Krueger Date of Service: 03/03/2021 12:45 PM Medical Record Number: [id number] Patient Account Number: [id number] Date of Birth/Sex: August 09, 1955 (66 y.o. F) Treating RN: Levora Dredge Primary Care Provider: Georgian Co Other Clinician: Referring Provider: Georgian Co Treating Provider/Extender: Skipper Cliche in Treatment: 0 Information Obtained from: Patient Chief Complaint Left LE Ulcer Electronic Signature(s) Signed: 03/03/2021 1:39:54 PM By: Worthy Keeler PA-C Entered By: Worthy Keeler on 03/03/2021 13:39:54 Veronica Krueger ([id number]) -------------------------------------------------------------------------------- Debridement Details Patient Name: Veronica Krueger Date of Service: 03/03/2021 12:45 PM Medical Record Number: [id number] Patient Account Number: [id number] Date of Birth/Sex: Nov 07, 1955 (65 y.o. F) Treating RN: Levora Dredge Primary Care Provider: Georgian Co Other Clinician: Referring Provider: Georgian Co Treating Provider/Extender: Skipper Cliche in Treatment: 0 Debridement Performed for Wound #1 Left,Lateral Lower Leg Assessment: Performed By: Physician Tommie Sams., PA-C Debridement Type: Chemical/Enzymatic/Mechanical Agent Used: Santyl Severity of Tissue Pre Debridement: Fat layer exposed Level of Consciousness (Pre- Awake and Alert procedure): Pre-procedure Verification/Time Out Yes - 14:10 Taken: Bleeding: None Response to Treatment: Procedure was tolerated well Level of Consciousness (Post- Awake and Alert procedure): Post Debridement Measurements of Total Wound Length: (cm) 4.4 Width: (cm) 6.2 Depth: (cm) 0.2 Volume: (cm) 4.285 Character of Wound/Ulcer Post Debridement: Stable Severity of Tissue Post Debridement: Fat layer exposed Post Procedure Diagnosis Same as Pre-procedure Electronic  Signature(s) Signed: 03/03/2021 3:33:26 PM By: Worthy Keeler PA-C Signed: 03/04/2021 8:37:56 AM By: Levora Dredge Entered By: Levora Dredge on 03/03/2021 14:11:06 Veronica Krueger ([id number]) -------------------------------------------------------------------------------- HPI Details Patient Name: Veronica Krueger Date of Service: 03/03/2021 12:45 PM Medical Record Number: [id number] Patient Account Number: [id number] Date of Birth/Sex: Oct 30, 1955 (65 y.o. F) Treating RN: Levora Dredge Primary Care Provider: Georgian Co Other Clinician: Referring Provider: Georgian Co Treating Provider/Extender: Skipper Cliche in Treatment: 0 History of Present Illness HPI Description: 03/03/2021 upon evaluation today patient appears to be doing somewhat poorly in regard to the wound on her left lateral lower extremity. She tells me this has been there since around October 2022. It came up as a blister and she is never had anything like this before. With that being said she currently is concerned about the fact that her home health nurse with [facility] was telling her that this could potentially be infected. With that being said I am really not seeing evidence or signs of infection at this time. Right now they have been using Santyl and then along with the Santyl a wet-to-dry dressing. No compressions been utilized. She did have arterial studies when she was in the hospital in November showed that she had an ABI on the right of 0.78 on the left and 0.75 which is obviously not optimal. The patient does have peripheral vascular disease, diabetes mellitus type 2 though she tells me is diet controlled she takes no medicines and does not check her blood sugars. She also has a history of hypertension and chronic venous insufficiency along with lymphedema that I see today. Electronic Signature(s) Signed: 03/03/2021 2:38:53 PM By: Worthy Keeler PA-C Entered By: Worthy Keeler on 03/03/2021  14:38:52 Veronica Krueger, Veronica Krueger ([id number]) -------------------------------------------------------------------------------- Physical Exam Details Patient Name: Veronica Krueger Date of Service: 03/03/2021 12:45 PM Medical Record Number: [id number] Patient Account Number: [id number] Date of Birth/Sex: 1955-10-26 (65 y.o. F) Treating RN: Levora Dredge Primary Care Provider: Georgian Co Other Clinician: Referring Provider: Georgian Co Treating Provider/Extender: Skipper Cliche in Treatment: 0 Constitutional patient is hypertensive.. pulse  regular and within target range for patient.Marland Kitchen respirations regular, non-labored and within target range for patient.Marland Kitchen temperature within target range for patient.. Obese and well-hydrated in no acute distress. Eyes conjunctiva clear no eyelid edema noted. pupils equal round and reactive to light and accommodation. Ears, Nose, Mouth, and Throat no gross abnormality of ear auricles or external auditory canals. normal hearing noted during conversation. mucus membranes moist. Respiratory normal breathing without difficulty. Cardiovascular Absent posterior tibial and dorsalis pedis pulses bilateral lower extremities. Patient does have bilateral stage III lymphedema. Musculoskeletal normal gait and posture. no significant deformity or arthritic changes, no loss or range of motion, no clubbing. Psychiatric this patient is able to make decisions and demonstrates good insight into disease process. Alert and Oriented x 3. pleasant and cooperative. Notes Upon inspection patient's wound actually does show some signs of hypergranulation but overall there is still some necrotic tissue noted as well. I really feel like that the Santyl is helpful to some degree though I believe we could add Hydrofera Blue over top of the Santyl to try to help benefit her as well. This is something that could be changed by home health twice per week and that we would see her in  the clinic 1 time per week. The patient voiced understanding and is in agreement with giving this a trial. Electronic Signature(s) Signed: 03/03/2021 2:39:53 PM By: Worthy Keeler PA-C Entered By: Worthy Keeler on 03/03/2021 14:39:53 Veronica Krueger ([id number]) -------------------------------------------------------------------------------- Physician Orders Details Patient Name: Veronica Krueger Date of Service: 03/03/2021 12:45 PM Medical Record Number: [id number] Patient Account Number: [id number] Date of Birth/Sex: Sep 05, 1955 (65 y.o. F) Treating RN: Levora Dredge Primary Care Provider: Georgian Co Other Clinician: Referring Provider: Georgian Co Treating Provider/Extender: Skipper Cliche in Treatment: 0 Verbal / Phone Orders: No Diagnosis Coding ICD-10 Coding Code Description I89.0 Lymphedema, not elsewhere classified L97.822 Non-pressure chronic ulcer of other part of left lower leg with fat layer exposed I73.89 Other specified peripheral vascular diseases I87.2 Venous insufficiency (chronic) (peripheral) I10 Essential (primary) hypertension E11.622 Type 2 diabetes mellitus with other skin ulcer Follow-up Appointments o Return Appointment in 1 week. o Nurse Visit as needed o Other: - should be seen by vascular doctor [place] for wound care. May utilize formulary equivalent dressing for wound treatment orders unless otherwise specified. Home Health Nurse may visit PRN to address patientos wound care needs. Bathing/ Shower/ Hygiene o Wash wounds with antibacterial soap and water. o No tub bath. Edema Control - Lymphedema / Segmental Compressive Device / Other Bilateral Lower Extremities o Elevate, Exercise Daily and Avoid Standing for Long Periods of Time. o Elevate legs to the level of the heart and pump ankles as often as possible o Elevate leg(s) parallel to the floor when sitting. o DO YOUR BEST to sleep in  the bed at night. DO NOT sleep in your recliner. Long hours of sitting in a recliner leads to swelling of the legs and/or potential wounds on your backside. Wound Treatment Wound #1 - Lower Leg Wound Laterality: Left, Lateral Topical: Santyl Collagenase Ointment, 30 (gm), tube 3 x Per Week/30 Days Discharge Instructions: apply nickel thick to wound bed only Primary Dressing: Hydrofera Blue Ready Transfer Foam, 2.5x2.5 (in/in) 3 x Per Week/30 Days Discharge Instructions: Apply Hydrofera Blue Ready to wound bed as directed Secondary Dressing: Zetuvit Plus Silicone Border Dressing 5x5 (in/in) 3 x Per Week/30 Days Secured With: Tubigrip Size G, 4.5x10 (in/yd) 3 x Per Week/30 Days Discharge Instructions:  Apply Tubigrip G 3-finger-widths below knee to base of toes to secure dressing and/or for swelling. Consults o Vascular - [place] vein and vascular bilateral, left lower leg has wound - (ICD10 I73.89 - Other specified peripheral vascular diseases) Patient Medications Allergies: penicillin, gabapentin Notifications Medication Indication Start End Santyl 03/03/2021 Veronica Krueger ([id number]) Notifications Medication Indication Start End DOSE topical 250 unit/gram ointment - ointment topical Apply nickel thick daily to the wound bed and then cover with a dressing as directed in clinic x 30 days Electronic Signature(s) Signed: 03/03/2021 2:42:51 PM By: Worthy Keeler PA-C Entered By: Worthy Keeler on 03/03/2021 14:42:50 Veronica Krueger ([id number]) -------------------------------------------------------------------------------- Problem List Details Patient Name: Veronica Krueger Date of Service: 03/03/2021 12:45 PM Medical Record Number: [id number] Patient Account Number: [id number] Date of Birth/Sex: June 06, 1955 (65 y.o. F) Treating RN: Levora Dredge Primary Care Provider: Georgian Co Other Clinician: Referring Provider: Georgian Co Treating Provider/Extender: Skipper Cliche in Treatment: 0 Active Problems ICD-10 Encounter Code Description Active Date MDM Diagnosis I89.0 Lymphedema, not elsewhere classified 03/03/2021 No Yes L97.822 Non-pressure chronic ulcer of other part of left lower leg with fat layer 03/03/2021 No Yes exposed I73.89 Other specified peripheral vascular diseases 03/03/2021 No Yes I87.2 Venous insufficiency (chronic) (peripheral) 03/03/2021 No Yes I10 Essential (primary) hypertension 03/03/2021 No Yes E11.622 Type 2 diabetes mellitus with other skin ulcer 03/03/2021 No Yes Inactive Problems Resolved Problems Electronic Signature(s) Signed: 03/03/2021 2:39:10 PM By: Worthy Keeler PA-C Previous Signature: 03/03/2021 1:39:37 PM Version By: Worthy Keeler PA-C Entered By: Worthy Keeler on 03/03/2021 14:39:09 Veronica Krueger ([id number]) -------------------------------------------------------------------------------- Progress Note Details Patient Name: Veronica Krueger Date of Service: 03/03/2021 12:45 PM Medical Record Number: [id number] Patient Account Number: [id number] Date of Birth/Sex: 11-11-1955 (65 y.o. F) Treating RN: Levora Dredge Primary Care Provider: Georgian Co Other Clinician: Referring Provider: Georgian Co Treating Provider/Extender: Skipper Cliche in Treatment: 0 Subjective Chief Complaint Information obtained from Patient Left LE Ulcer History of Present Illness (HPI) 03/03/2021 upon evaluation today patient appears to be doing somewhat poorly in regard to the wound on her left lateral lower extremity. She tells me this has been there since around October 2022. It came up as a blister and she is never had anything like this before. With that being said she currently is concerned about the fact that her home health nurse with [facility] was telling her that this could potentially be infected. With that being said I am really not seeing evidence or signs of infection at this time. Right now they have been  using Santyl and then along with the Santyl a wet-to-dry dressing. No compressions been utilized. She did have arterial studies when she was in the hospital in November showed that she had an ABI on the right of 0.78 on the left and 0.75 which is obviously not optimal. The patient does have peripheral vascular disease, diabetes mellitus type 2 though she tells me is diet controlled she takes no medicines and does not check her blood sugars. She also has a history of hypertension and chronic venous insufficiency along with lymphedema that I see today. Patient History Allergies penicillin (Reaction: itching swelling), gabapentin (Reaction: itching) Social History Never smoker, Marital Status - Single, Alcohol Use - Never, Drug Use - No History, Caffeine Use - Moderate - coffee. Medical History Eyes Patient has history of Glaucoma Hematologic/Lymphatic Patient has history of Anemia, Lymphedema Cardiovascular Patient has history of Hypertension Endocrine Patient has history of Type II Diabetes Integumentary (Skin) Denies history of  History of Burn, History of pressure wounds Musculoskeletal Patient has history of Gout Patient is treated with Controlled Diet. Blood sugar is not tested. 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 Eyes Denies complaints or symptoms of Dry Eyes, Vision Changes, Glasses / Contacts. Ear/Nose/Mouth/Throat Denies complaints or symptoms of Difficult clearing ears, Sinusitis. Respiratory Denies complaints or symptoms of Chronic or frequent coughs, Shortness of Breath. Gastrointestinal Denies complaints or symptoms of Frequent diarrhea, Nausea, Vomiting. Genitourinary Denies complaints or symptoms of Kidney failure/ Dialysis, Incontinence/dribbling. Immunological Denies complaints or  symptoms of Hives, Itching. Integumentary (Skin) Complains or has symptoms of Swelling. Musculoskeletal Complains or has symptoms of Muscle Weakness - LUE. Neurologic Denies complaints or symptoms of Numbness/parasthesias, Focal/Weakness. Veronica Krueger, Veronica Krueger ([id number]) Psychiatric Denies complaints or symptoms of Anxiety, Claustrophobia. Objective Constitutional patient is hypertensive.. pulse regular and within target range for patient.Marland Kitchen respirations regular, non-labored and within target range for patient.Marland Kitchen temperature within target range for patient.. Obese and well-hydrated in no acute distress. Vitals Time Taken: 1:06 PM, Height: 69 in, Source: Stated, Temperature: 97.8 F, Pulse: 69 bpm, Respiratory Rate: 18 breaths/min, Blood Pressure: 175/83 mmHg. General Notes: pt unable to stand up on to scale Eyes conjunctiva clear no eyelid edema noted. pupils equal round and reactive to light and accommodation. Ears, Nose, Mouth, and Throat no gross abnormality of ear auricles or external auditory canals. normal hearing noted during conversation. mucus membranes moist. Respiratory normal breathing without difficulty. Cardiovascular Absent posterior tibial and dorsalis pedis pulses bilateral lower extremities. Patient does have bilateral stage III lymphedema. Musculoskeletal normal gait and posture. no significant deformity or arthritic changes, no loss or range of motion, no clubbing. Psychiatric this patient is able to make decisions and demonstrates good insight into disease process. Alert and Oriented x 3. pleasant and cooperative. General Notes: Upon inspection patient's wound actually does show some signs of hypergranulation but overall there is still some necrotic tissue noted as well. I really feel like that the Santyl is helpful to some degree though I believe we could add Hydrofera Blue over top of the Santyl to try to help benefit her as well. This is something that could be  changed by home health twice per week and that we would see her in the clinic 1 time per week. The patient voiced understanding and is in agreement with giving this a trial. Integumentary (Hair, Skin) Wound #1 status is Open. Original cause of wound was Blister. The date acquired was: 12/04/2020. The wound is located on the Left,Lateral Lower Leg. The wound measures 4.4cm length x 6.2cm width x 0.2cm depth; 21.426cm^2 area and 4.285cm^3 volume. There is Fat Layer (Subcutaneous Tissue) exposed. There is no tunneling or undermining noted. There is a medium amount of serosanguineous drainage noted. There is large (67-100%) red, hyper - granulation within the wound bed. There is a small (1-33%) amount of necrotic tissue within the wound bed including Adherent Slough. Assessment Active Problems ICD-10 Lymphedema, not elsewhere classified Non-pressure chronic ulcer of other part of left lower leg with fat layer exposed Other specified peripheral vascular diseases Venous insufficiency (chronic) (peripheral) Essential (primary) hypertension Type 2 diabetes mellitus with other skin ulcer Procedures Veronica Krueger, Veronica Krueger ([id number]) Wound #1 Pre-procedure diagnosis of Wound #1 is a Diabetic Wound/Ulcer of the Lower Extremity located on the Left,Lateral Lower Leg .Severity of Tissue Pre Debridement is: Fat layer exposed. There was a Chemical/Enzymatic/Mechanical debridement  performed by Tommie Sams., PA-C.Marland Kitchen Agent used was [employer]. A time out was conducted at 14:10, prior to the start of the procedure. There was no bleeding. The procedure was tolerated well. Post Debridement Measurements: 4.4cm length x 6.2cm width x 0.2cm depth; 4.285cm^3 volume. Character of Wound/Ulcer Post Debridement is stable. Severity of Tissue Post Debridement is: Fat layer exposed. Post procedure Diagnosis Wound #1: Same as Pre-Procedure Plan Follow-up Appointments: Return Appointment in 1 week. Nurse Visit as needed Other:  - should be seen by vascular doctor Home Health: [facility] for wound care. May utilize formulary equivalent dressing for wound treatment orders unless otherwise specified. Home Health Nurse may visit PRN to address patient s wound care needs. Bathing/ Shower/ Hygiene: Wash wounds with antibacterial soap and water. No tub bath. Edema Control - Lymphedema / Segmental Compressive Device / Other: Elevate, Exercise Daily and Avoid Standing for Long Periods of Time. Elevate legs to the level of the heart and pump ankles as often as possible Elevate leg(s) parallel to the floor when sitting. DO YOUR BEST to sleep in the bed at night. DO NOT sleep in your recliner. Long hours of sitting in a recliner leads to swelling of the legs and/or potential wounds on your backside. Consults ordered were: Vascular - [place] vein and vascular bilateral, left lower leg has wound The following medication(s) was prescribed: Santyl topical 250 unit/gram ointment ointment topical Apply nickel thick daily to the wound bed and then cover with a dressing as directed in clinic x 30 days starting 03/03/2021 WOUND #1: - Lower Leg Wound Laterality: Left, Lateral Topical: Santyl Collagenase Ointment, 30 (gm), tube 3 x Per Week/30 Days Discharge Instructions: apply nickel thick to wound bed only Primary Dressing: Hydrofera Blue Ready Transfer Foam, 2.5x2.5 (in/in) 3 x Per Week/30 Days Discharge Instructions: Apply Hydrofera Blue Ready to wound bed as directed Secondary Dressing: Zetuvit Plus Silicone Border Dressing 5x5 (in/in) 3 x Per Week/30 Days Secured With: Tubigrip Size G, 4.5x10 (in/yd) 3 x Per Week/30 Days Discharge Instructions: Apply Tubigrip G 3-finger-widths below knee to base of toes to secure dressing and/or for swelling. 1. Would recommend currently that we go ahead and continue with the recommendation currently for the patient that she continue with the Santyl and I am actually going to send in a  prescription for this for her today. 2. I am also can recommend that we add Hydrofera Blue over top of the [place] which I think will be a much better way to go as far as that is concerned with the amount of drainage that we are seeing. The patient voiced understanding and is in agreement with that plan as well. 3. I would also suggest that we use Tubigrip size G for her after applying the border foam dressing. I do believe that she would benefit from a stronger compression wrap but right now we need to get her vascular status evaluated before we go that route. 4. I am also can go ahead and make a recommendation for [place] to have her seen at vascular for further evaluation and treatment of what appears to be a peripheral vascular issue. I think that that something that needs to be done sooner rather than later to try to get this to heal as quickly as possible. We will see patient back for reevaluation in 1 week here in the clinic. If anything worsens or changes patient will contact our office for additional recommendations. Apply nickel thick daily to the wound bed and  then cover with a dressing as directed in clinic x 30 days Patient's wound bed is 4.4 cm x 6.2 cm Electronic Signature(s) Signed: 03/03/2021 2:43:21 PM By: Worthy Keeler PA-C Entered By: Worthy Keeler on 03/03/2021 14:43:21 Veronica Krueger ([id number]) -------------------------------------------------------------------------------- ROS/PFSH Details Patient Name: Veronica Krueger Date of Service: 03/03/2021 12:45 PM Medical Record Number: [id number] Patient Account Number: [id number] Date of Birth/Sex: Jan 19, 1956 (65 y.o. F) Treating RN: Levora Dredge Primary Care Provider: Georgian Co Other Clinician: Referring Provider: Georgian [facility] Treating Provider/Extender: Skipper Cliche in Treatment: 0 Constitutional Symptoms (General Health) Complaints and Symptoms: Negative for: Fatigue; Fever; Chills; Marked Weight  Change Eyes Complaints and Symptoms: Negative for: Dry Eyes; Vision Changes; Glasses / Contacts Medical History: Positive for: Glaucoma Ear/Nose/Mouth/Throat Complaints and Symptoms: Negative for: Difficult clearing ears; Sinusitis Respiratory Complaints and Symptoms: Negative for: Chronic or frequent coughs; Shortness of Breath Gastrointestinal Complaints and Symptoms: Negative for: Frequent diarrhea; Nausea; Vomiting Genitourinary Complaints and Symptoms: Negative for: Kidney failure/ Dialysis; Incontinence/dribbling Immunological Complaints and Symptoms: Negative for: Hives; Itching Integumentary (Skin) Complaints and Symptoms: Positive for: Swelling Medical History: Negative for: History of Burn; History of pressure wounds Musculoskeletal Complaints and Symptoms: Positive for: Muscle Weakness - LUE Medical History: Positive for: Gout Past Medical History Notes: arthritis in shoulders Neurologic Veronica Krueger, Veronica Krueger ([id number]) Complaints and Symptoms: Negative for: Numbness/parasthesias; Focal/Weakness Psychiatric Complaints and Symptoms: Negative for: Anxiety; Claustrophobia Hematologic/Lymphatic Medical History: Positive for: Anemia; Lymphedema Cardiovascular Medical History: Positive for: Hypertension Endocrine Medical History: Positive for: Type II Diabetes Time with diabetes: 10 years Treated with: Diet Blood sugar tested every day: No Oncologic HBO Extended History Items Eyes: Glaucoma Immunizations Pneumococcal Vaccine: Received Pneumococcal Vaccination: Yes Received Pneumococcal Vaccination On or After 60th Birthday: Yes Implantable Devices None Family and Social History Never smoker; Marital Status - Single; Alcohol Use: Never; Drug Use: No History; Caffeine Use: Moderate - coffee Electronic Signature(s) Signed: 03/03/2021 3:33:26 PM By: Worthy Keeler PA-C Signed: 03/04/2021 8:37:56 AM By: Levora Dredge Entered By: Levora Dredge on  03/03/2021 13:13:41 Veronica Krueger ([id number]) -------------------------------------------------------------------------------- SuperBill Details Patient Name: Veronica Krueger Date of Service: 03/03/2021 Medical Record Number: [id number] Patient Account Number: [id number] Date of Birth/Sex: Apr 21, 1955 (66 y.o. F) Treating RN: Levora Dredge Primary Care Provider: [facility] Other Clinician: Referring Provider: [facility] Treating Provider/Extender: Skipper Cliche in Treatment: 0 Diagnosis Coding ICD-10 Codes Code Description I89.0 Lymphedema, not elsewhere classified L97.822 Non-pressure chronic ulcer of other part of left lower leg with fat layer exposed I87.2 Venous insufficiency (chronic) (peripheral) I10 Essential (primary) hypertension E11.622 Type 2 diabetes mellitus with other skin ulcer Facility Procedures CPT4 Code: [id number] Description: 89373 - DEBRIDE W/O ANES NON SELECT Modifier: Quantity: 1 Physician Procedures CPT4 Code: 4287681 Description: 15726 - WC PHYS LEVEL 4 - NEW PT Modifier: Quantity: 1 CPT4 Code: Description: ICD-10 Diagnosis Description I89.0 Lymphedema, not elsewhere classified 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

## 2021-03-04 NOTE — Progress Notes (Signed)
RHIA, [place] ([id number]) Visit Report for 03/03/2021 Abuse/Suicide Risk Screen Details Patient Name: Veronica Krueger, Veronica Krueger Date of Service: 03/03/2021 12:45 PM Medical Record Number: [id number] Patient Account Number: [id number] Date of Birth/Sex: [birth year] (66 y.o. F) Treating RN: Levora Dredge Primary Care Louan Base: [place] [employer] Other Clinician: Referring Brianne Maina: [place] [employer] Treating Marshel Golubski/Extender: Skipper Cliche in Treatment: 0 Abuse/Suicide Risk Screen Items Answer ABUSE RISK SCREEN: Has anyone close to you tried to hurt or harm you recentlyo No Do you feel uncomfortable with anyone in your familyo No Has anyone forced you do things that you didnot want to doo No Electronic Signature(s) Signed: 03/04/2021 8:37:56 AM By: Levora Dredge Entered By: Levora Dredge on 03/03/2021 13:13:56 Veronica Krueger ([id number]) -------------------------------------------------------------------------------- Activities of Daily Living Details Patient Name: Veronica Krueger Date of Service: 03/03/2021 12:45 PM Medical Record Number: [id number] Patient Account Number: [id number] Date of Birth/Sex: [birth year] (65 y.o. F) Treating RN: Levora Dredge Primary Care Nakiesha Rumsey: [place] [employer] Other Clinician: Referring Damier Disano: [place] [employer] Treating Kashton Mcartor/Extender: Skipper Cliche in Treatment: 0 Activities of Daily Living Items Answer Activities of Daily Living (Please select one for each item) Drive Automobile Not Able Take Medications Completely Able Use Telephone Completely Able Care for Appearance Completely Able Use Toilet Completely Able Bath / Shower Completely Able Dress Self Completely Able Feed Self Completely Able Walk Need Assistance Get In / Out Bed Completely Able Housework Need Assistance Prepare Meals Completely [place] for Self Need Assistance Electronic Signature(s) Signed: 03/04/2021 8:37:56 AM By: Levora Dredge Entered By: Levora Dredge on 03/03/2021 13:14:46 Veronica Krueger ([id number]) -------------------------------------------------------------------------------- Education Screening Details Patient Name: Veronica Krueger Date of Service: 03/03/2021 12:45 PM Medical Record Number: [id number] Patient Account Number: [id number] Date of Birth/Sex: 1955-04-09 (65 y.o. F) Treating RN: Levora Dredge Primary Care Zaveon Gillen: [place] [employer] Other Clinician: Referring Kisean Rollo: [place] [employer] Treating Richel Millspaugh/Extender: Skipper Cliche in Treatment: 0 Learning Preferences/Education Level/Primary Language Learning Preference: Explanation, Printed Material Highest Education Level: College or Above Preferred Language: English Cognitive Barrier Language Barrier: No Translator Needed: No Memory Deficit: No Emotional Barrier: No Cultural/Religious Beliefs Affecting Medical Care: No Physical Barrier Impaired Vision: No Impaired Hearing: No Decreased Hand dexterity: Yes Limitations: LUE Knowledge/Comprehension Knowledge Level: High Comprehension Level: High Ability to understand written instructions: High Ability to understand verbal instructions: High Motivation Anxiety Level: Calm Cooperation: Cooperative Education Importance: Acknowledges Need Interest in Health Problems: Asks Questions Perception: Coherent Willingness to Engage in Self-Management High Activities: Readiness to Engage in Self-Management High Activities: Electronic Signature(s) Signed: 03/04/2021 8:37:56 AM By: Levora Dredge Entered By: Levora Dredge on 03/03/2021 13:15:25 Veronica Krueger ([id number]) -------------------------------------------------------------------------------- Fall Risk Assessment Details Patient Name: Veronica Krueger Date of Service: 03/03/2021 12:45 PM Medical Record Number: [id number] Patient Account Number: [id number] Date of Birth/Sex: 01-21-1956 (65 y.o. F) Treating RN:  Levora Dredge Primary Care Quida Glasser: [place] [employer] Other Clinician: Referring Chung Chagoya: [place] [employer] Treating Hayzel Ruberg/Extender: Skipper Cliche in Treatment: 0 Fall Risk Assessment Items Have you had 2 or more falls in the last 12 monthso 0 No Have you had any fall that resulted in injury in the last 12 monthso 0 No FALLS RISK SCREEN History of falling - immediate or within 3 months 0 No Secondary diagnosis (Do you have 2 or more medical diagnoseso) 0 No Ambulatory aid None/bed rest/wheelchair/nurse 0 No Crutches/cane/walker 15 Yes Furniture 0 No Intravenous therapy Access/Saline/Heparin Lock 0 No Gait/Transferring Normal/ bed rest/ wheelchair 0 Yes Weak (short steps with or without shuffle, stooped but able to lift head while walking,  may 0 No seek support from furniture) Impaired (short steps with shuffle, may have difficulty arising from chair, head down, impaired 0 No balance) Mental Status Oriented to own ability 0 Yes Electronic Signature(s) Signed: 03/04/2021 8:37:56 AM By: Levora Dredge Entered By: Levora Dredge on 03/03/2021 13:15:48 Veronica Krueger ([id number]) -------------------------------------------------------------------------------- Foot Assessment Details Patient Name: Veronica Krueger Date of Service: 03/03/2021 12:45 PM Medical Record Number: [id number] Patient Account Number: [id number] Date of Birth/Sex: 06-27-1955 (65 y.o. F) Treating RN: Levora Dredge Primary Care Aniyla Harling: [place] [employer] Other Clinician: Referring Denell Cothern: [place] [employer] Treating Madicyn Mesina/Extender: Skipper Cliche in Treatment: 0 Foot Assessment Items Site Locations + = Sensation present, - = Sensation absent, C = Callus, U = Ulcer R = Redness, W = Warmth, M = Maceration, PU = Pre-ulcerative lesion F = Fissure, S = Swelling, D = Dryness Assessment Right: Left: Other Deformity: No No Prior Foot Ulcer: No No Prior Amputation: No No Charcot Joint: No  No Ambulatory Status: Ambulatory Without Help Gait: Steady Electronic Signature(s) Signed: 03/04/2021 8:37:56 AM By: Levora Dredge Entered By: Levora Dredge on 03/03/2021 13:19:22 Veronica Krueger ([id number]) -------------------------------------------------------------------------------- Nutrition Risk Screening Details Patient Name: Veronica Krueger Date of Service: 03/03/2021 12:45 PM Medical Record Number: [id number] Patient Account Number: [id number] Date of Birth/Sex: [birth year] (65 y.o. F) Treating RN: Levora Dredge Primary Care Dawnya Grams: [place] [employer] Other Clinician: Referring Keosha Rossa: [place] [employer] Treating Nyrie Sigal/Extender: Jeri Cos Weeks in Treatment: 0 Height (in): 69 Weight (lbs): Body Mass Index (BMI): Nutrition Risk Screening Items Score Screening NUTRITION RISK SCREEN: I have an illness or condition that made me change the kind and/or amount of food I eat 0 No I eat fewer than two meals per day 0 No I eat few fruits and vegetables, or milk products 0 No I have three or more drinks of beer, liquor or wine almost every day 0 No I have tooth or mouth problems that make it hard for me to eat 0 No I don't always have enough money to buy the food I need 0 No I eat alone most of the time 0 No I take three or more different prescribed or over-the-counter drugs a day 0 No Without wanting to, I have lost or gained 10 pounds in the last six months 0 No I am not always physically able to shop, cook and/or feed myself 0 No Nutrition Protocols Good Risk Protocol 0 No interventions needed Moderate Risk Protocol High Risk Proctocol Risk Level: Good Risk Score: 0 Electronic Signature(s) Signed: 03/04/2021 8:37:56 AM By: Levora Dredge Entered By: Levora Dredge on 03/03/2021 13:16:04

## 2021-03-07 ENCOUNTER — Encounter: Payer: Self-pay | Admitting: Internal Medicine

## 2021-03-11 ENCOUNTER — Ambulatory Visit: Payer: Medicare HMO

## 2021-03-17 ENCOUNTER — Other Ambulatory Visit: Payer: Self-pay

## 2021-03-17 ENCOUNTER — Encounter: Payer: Medicare HMO | Admitting: Physician Assistant

## 2021-03-17 ENCOUNTER — Other Ambulatory Visit (INDEPENDENT_AMBULATORY_CARE_PROVIDER_SITE_OTHER): Payer: Self-pay | Admitting: Physician Assistant

## 2021-03-17 DIAGNOSIS — L97822 Non-pressure chronic ulcer of other part of left lower leg with fat layer exposed: Secondary | ICD-10-CM

## 2021-03-17 DIAGNOSIS — E1151 Type 2 diabetes mellitus with diabetic peripheral angiopathy without gangrene: Secondary | ICD-10-CM | POA: Diagnosis not present

## 2021-03-17 DIAGNOSIS — I890 Lymphedema, not elsewhere classified: Secondary | ICD-10-CM | POA: Diagnosis not present

## 2021-03-17 DIAGNOSIS — I872 Venous insufficiency (chronic) (peripheral): Secondary | ICD-10-CM | POA: Diagnosis not present

## 2021-03-17 DIAGNOSIS — E11622 Type 2 diabetes mellitus with other skin ulcer: Secondary | ICD-10-CM | POA: Diagnosis not present

## 2021-03-17 DIAGNOSIS — M109 Gout, unspecified: Secondary | ICD-10-CM | POA: Diagnosis not present

## 2021-03-17 DIAGNOSIS — I10 Essential (primary) hypertension: Secondary | ICD-10-CM | POA: Diagnosis not present

## 2021-03-17 NOTE — Progress Notes (Addendum)
CHANI, GHANEM ([id number]) Visit Report for 03/17/2021 Arrival Information Details Patient Name: Veronica Krueger, Veronica Krueger Date of Service: 03/17/2021 3:00 PM Medical Record Number: [id number] Patient Account Number: [id number] Date of Birth/Sex: Jul 20, 1955 (66 y.o. Female) Treating RN: Levora Dredge Primary Care Marica Trentham: [place] [employer] Other Clinician: Referring Tiamarie Furnari: [place] [employer] Treating Ollie Esty/Extender: Skipper Cliche in Treatment: 2 Visit Information History Since Last Visit Added or deleted any medications: No Patient Arrived: Wheel Chair Any new allergies or adverse reactions: No Arrival Time: 15:20 Had a fall or experienced change in No Accompanied By: self activities of daily living that may affect Transfer Assistance: EasyPivot Patient Lift risk of falls: Patient Identification Verified: Yes Hospitalized since last visit: No Secondary Verification Process Completed: Yes Has Dressing in Place as Prescribed: Yes Patient Has Alerts: Yes Pain Present Now: Yes Patient Alerts: diabetic type 2 ABI 01/06/21 L 0.75 R 0.7 Electronic Signature(s) Signed: 03/17/2021 4:44:26 PM By: Levora Dredge Entered By: Levora Dredge on 03/17/2021 15:20:26 Veronica Krueger ([id number]) -------------------------------------------------------------------------------- Clinic Level of Care Assessment Details Patient Name: Veronica Krueger Date of Service: 03/17/2021 3:00 PM Medical Record Number: [id number] Patient Account Number: [id number] Date of Birth/Sex: 07-07-1955 (66 y.o. Female) Treating RN: Levora Dredge Primary Care Kaci Dillie: [place] [employer] Other Clinician: Referring Fusae Florio: [place] [employer] Treating Kelyn Koskela/Extender: Skipper Cliche in Treatment: 2 Clinic Level of Care Assessment Items TOOL 4 Quantity Score X - Use when only an EandM is performed on FOLLOW-UP visit 1 0 ASSESSMENTS - Nursing Assessment / Reassessment []  - Reassessment of Co-morbidities  (includes updates in patient status) 0 []  - 0 Reassessment of Adherence to Treatment Plan ASSESSMENTS - Wound and Skin Assessment / Reassessment X - Simple Wound Assessment / Reassessment - one wound 1 5 []  - 0 Complex Wound Assessment / Reassessment - multiple wounds []  - 0 Dermatologic / Skin Assessment (not related to wound area) ASSESSMENTS - Focused Assessment []  - Circumferential Edema Measurements - multi extremities 0 []  - 0 Nutritional Assessment / Counseling / Intervention []  - 0 Lower Extremity Assessment (monofilament, tuning fork, pulses) []  - 0 Peripheral Arterial Disease Assessment (using hand held doppler) ASSESSMENTS - Ostomy and/or Continence Assessment and Care []  - Incontinence Assessment and Management 0 []  - 0 Ostomy Care Assessment and Management (repouching, etc.) PROCESS - Coordination of Care X - Simple Patient / Family Education for ongoing care 1 15 []  - 0 Complex (extensive) Patient / Family Education for ongoing care []  - 0 Staff obtains Programmer, systems, Records, Test Results / Process Orders []  - 0 Staff telephones HHA, Nursing Homes / Clarify orders / etc []  - 0 Routine Transfer to another Facility (non-emergent condition) []  - 0 Routine Hospital Admission (non-emergent condition) []  - 0 New Admissions / Biomedical engineer / Ordering NPWT, Apligraf, etc. []  - 0 Emergency Hospital Admission (emergent condition) X- 1 10 Simple Discharge Coordination []  - 0 Complex (extensive) Discharge Coordination PROCESS - Special Needs []  - Pediatric / Minor Patient Management 0 []  - 0 Isolation Patient Management []  - 0 Hearing / Language / Visual special needs []  - 0 Assessment of Community assistance (transportation, D/C planning, etc.) []  - 0 Additional assistance / Altered mentation []  - 0 Support Surface(s) Assessment (bed, cushion, seat, etc.) INTERVENTIONS - Wound Cleansing / Measurement Veronica Krueger, Veronica Krueger ([id number]) X- 1 5 Simple Wound  Cleansing - one wound []  - 0 Complex Wound Cleansing - multiple wounds X- 1 5 Wound Imaging (photographs - any number of wounds) []  - 0 Wound Tracing (instead of photographs) X- 1 5 Simple  Wound Measurement - one wound []  - 0 Complex Wound Measurement - multiple wounds INTERVENTIONS - Wound Dressings X - Small Wound Dressing one or multiple wounds 1 10 []  - 0 Medium Wound Dressing one or multiple wounds []  - 0 Large Wound Dressing one or multiple wounds []  - 0 Application of Medications - topical []  - 0 Application of Medications - injection INTERVENTIONS - Miscellaneous []  - External ear exam 0 []  - 0 Specimen Collection (cultures, biopsies, blood, body fluids, etc.) []  - 0 Specimen(s) / Culture(s) sent or taken to Lab for analysis []  - 0 Patient Transfer (multiple staff / Civil Service fast streamer / Similar devices) []  - 0 Simple Staple / Suture removal (25 or less) []  - 0 Complex Staple / Suture removal (26 or more) []  - 0 Hypo / Hyperglycemic Management (close monitor of Blood Glucose) []  - 0 Ankle / Brachial Index (ABI) - do not check if billed separately X- 1 5 Vital Signs Has the patient been seen at the hospital within the last three years: Yes Total Score: 60 Level Of Care: New/Established - Level 2 Electronic Signature(s) Signed: 03/17/2021 4:44:26 PM By: Levora Dredge Entered By: Levora Dredge on 03/17/2021 16:03:40 Veronica Krueger ([id number]) -------------------------------------------------------------------------------- Encounter Discharge Information Details Patient Name: Veronica Krueger Date of Service: 03/17/2021 3:00 PM Medical Record Number: [id number] Patient Account Number: [id number] Date of Birth/Sex: [birth year] (65 y.o. Female) Treating RN: Levora Dredge Primary Care Myangel Summons: [place] [employer] Other Clinician: Referring Alayja Armas: [place] [employer] Treating Anetha Slagel/Extender: Skipper Cliche in Treatment: 2 Encounter Discharge Information  Items Discharge Condition: Stable Ambulatory Status: Wheelchair Discharge Destination: Home Transportation: Private Auto Accompanied By: mother Schedule Follow-up Appointment: Yes Clinical Summary of Care: Electronic Signature(s) Signed: 03/17/2021 4:44:26 PM By: Levora Dredge Entered By: Levora Dredge on 03/17/2021 16:04:33 Veronica Krueger ([id number]) -------------------------------------------------------------------------------- Lower Extremity Assessment Details Patient Name: Veronica Krueger Date of Service: 03/17/2021 3:00 PM Medical Record Number: [id number] Patient Account Number: [id number] Date of Birth/Sex: 05-22-1955 (65 y.o. Female) Treating RN: Levora Dredge Primary Care Gurpreet Mikhail: [place] [employer] Other Clinician: Referring Kristine Chahal: [place] [employer] Treating Christiona Siddique/Extender: Jeri Cos Weeks in Treatment: 2 Edema Assessment Assessed: [Left: No] [Right: No] Edema: [Left: Ye] [Right: s] Calf Left: Right: Point of Measurement: 32 cm From Medial Instep 60 cm Ankle Left: Right: Point of Measurement: 9 cm From Medial Instep 42.2 cm Vascular Assessment Pulses: Dorsalis Pedis Palpable: [Left:Yes] Electronic Signature(s) Signed: 03/17/2021 4:44:26 PM By: Levora Dredge Entered By: Levora Dredge on 03/17/2021 15:34:24 Veronica Krueger ([id number]) -------------------------------------------------------------------------------- Multi Wound Chart Details Patient Name: Veronica Krueger Date of Service: 03/17/2021 3:00 PM Medical Record Number: [id number] Patient Account Number: [id number] Date of Birth/Sex: 1955/07/26 (65 y.o. Female) Treating RN: Levora Dredge Primary Care Camylle Whicker: [place] [employer] Other Clinician: Referring Colton Tassin: [place] [employer] Treating Nealy Hickmon/Extender: Skipper Cliche in Treatment: 2 Vital Signs Height(in): 36 Pulse(bpm): 45 Weight(lbs): Blood Pressure(mmHg): 169/72 Body Mass Index(BMI): Temperature(F):  98.2 Respiratory Rate(breaths/min): 18 Photos: [N/A:N/A] Wound Location: Left, Lateral Lower Leg N/A N/A Wounding Event: Blister N/A N/A Primary Etiology: Diabetic Wound/Ulcer of the Lower N/A N/A Extremity Comorbid History: Glaucoma, Anemia, Lymphedema, N/A N/A Hypertension, Type II Diabetes, Gout Date Acquired: 12/04/2020 N/A N/A Weeks of Treatment: 2 N/A N/A Wound Status: Open N/A N/A Wound Recurrence: No N/A N/A Measurements L x W x D (cm) 5x6.4x0.2 N/A N/A Area (cm) : 25.133 N/A N/A Volume (cm) : 5.027 N/A N/A % Reduction in Area: -17.30% N/A N/A % Reduction in Volume: -17.30% N/A N/A Classification: Grade 1 N/A N/A Exudate Amount: Large N/A  N/A Exudate Type: Serosanguineous N/A N/A Exudate Color: red, brown N/A N/A Granulation Amount: Medium (34-66%) N/A N/A Granulation Quality: Red, Hyper-granulation N/A N/A Necrotic Amount: Medium (34-66%) N/A N/A Exposed Structures: Fat Layer (Subcutaneous Tissue): N/A N/A Yes Epithelialization: None N/A N/A Treatment Notes Electronic Signature(s) Signed: 03/17/2021 4:44:26 PM By: Levora Dredge Entered By: Levora Dredge on 03/17/2021 15:42:25 Veronica Krueger ([id number]) -------------------------------------------------------------------------------- Brogan Details Patient Name: Veronica Krueger Date of Service: 03/17/2021 3:00 PM Medical Record Number: [id number] Patient Account Number: [id number] Date of Birth/Sex: 07-05-1955 (65 y.o. Female) Treating RN: Levora Dredge Primary Care Latina Frank: [place] [employer] Other Clinician: Referring Kimberlin Scheel: [place] [employer] Treating Denny Mccree/Extender: Skipper Cliche in Treatment: 2 Active Inactive Wound/Skin Impairment Nursing Diagnoses: Impaired tissue integrity Knowledge deficit related to ulceration/compromised skin integrity Goals: Patient will have a decrease in wound volume by X% from date: (specify in notes) Date Initiated: 03/03/2021 Target  Resolution Date: 03/31/2021 Goal Status: Active Patient/caregiver will verbalize understanding of skin care regimen Date Initiated: 03/03/2021 Target Resolution Date: 03/31/2021 Goal Status: Active Ulcer/skin breakdown will have a volume reduction of 30% by week 4 Date Initiated: 03/03/2021 Target Resolution Date: 04/28/2021 Goal Status: Active Ulcer/skin breakdown will have a volume reduction of 50% by week 8 Date Initiated: 03/03/2021 Target Resolution Date: 05/26/2021 Goal Status: Active Ulcer/skin breakdown will have a volume reduction of 80% by week 12 Date Initiated: 03/03/2021 Target Resolution Date: 06/23/2021 Goal Status: Active Ulcer/skin breakdown will heal within 14 weeks Date Initiated: 03/03/2021 Target Resolution Date: 07/07/2021 Goal Status: Active Interventions: Assess patient/caregiver ability to obtain necessary supplies Assess patient/caregiver ability to perform ulcer/skin care regimen upon admission and as needed Assess ulceration(s) every visit Provide education on ulcer and skin care Notes: Electronic Signature(s) Signed: 03/17/2021 4:44:26 PM By: Levora Dredge Entered By: Levora Dredge on 03/17/2021 15:42:11 Veronica Krueger ([id number]) -------------------------------------------------------------------------------- Pain Assessment Details Patient Name: Veronica Krueger Date of Service: 03/17/2021 3:00 PM Medical Record Number: [id number] Patient Account Number: [id number] Date of Birth/Sex: [birth year] (66 y.o. Female) Treating RN: Levora Dredge Primary Care Albertha Beattie: [place] [employer] Other Clinician: Referring Kevontay Burks: [place] [employer] Treating Edee Nifong/Extender: Skipper Cliche in Treatment: 2 Active Problems Location of Pain Severity and Description of Pain Patient Has Paino Yes Site Locations Rate the pain. Current Pain Level: 2 Pain Management and Medication Current Pain Management: Electronic Signature(s) Signed: 03/17/2021 4:44:26 PM By: Levora Dredge Entered By: Levora Dredge on 03/17/2021 15:20:44 Veronica Krueger ([id number]) -------------------------------------------------------------------------------- Patient/Caregiver Education Details Patient Name: Veronica Krueger Date of Service: 03/17/2021 3:00 PM Medical Record Number: [id number] Patient Account Number: [id number] Date of Birth/Gender: [birth year] (65 y.o. Female) Treating RN: Levora Dredge Primary Care Physician: [place] [employer] Other Clinician: Referring Physician: [place] [employer] Treating Physician/Extender: Skipper Cliche in Treatment: 2 Education Assessment Education Provided To: Patient Education Topics Provided Wound/Skin Impairment: Handouts: Caring for Your Ulcer Methods: Explain/Verbal Responses: State content correctly Electronic Signature(s) Signed: 03/17/2021 4:44:26 PM By: Levora Dredge Entered By: Levora Dredge on 03/17/2021 16:03:55 Veronica Krueger ([id number]) -------------------------------------------------------------------------------- Wound Assessment Details Patient Name: Veronica Krueger Date of Service: 03/17/2021 3:00 PM Medical Record Number: [id number] Patient Account Number: [id number] Date of Birth/Sex: 1955/11/16 (65 y.o. Female) Treating RN: Levora Dredge Primary Care Alexianna Nachreiner: [place] [employer] Other Clinician: Referring Xayne Brumbaugh: [place] [employer] Treating Tamerra Merkley/Extender: Jeri Cos Weeks in Treatment: 2 Wound Status Wound Number: 1 Primary Diabetic Wound/Ulcer of the Lower Extremity Etiology: Wound Location: Left, Lateral Lower Leg Wound Status: Open Wounding Event: Blister Comorbid Glaucoma, Anemia, Lymphedema, Hypertension, Type Date Acquired: 12/04/2020 History: II Diabetes, Gout Weeks Of  Treatment: 2 Clustered Wound: No Photos Wound Measurements Length: (cm) 5 Width: (cm) 6.4 Depth: (cm) 0.2 Area: (cm) 25.133 Volume: (cm) 5.027 % Reduction in Area: -17.3% % Reduction in Volume:  -17.3% Epithelialization: None Tunneling: No Undermining: No Wound Description Classification: Grade 1 Exudate Amount: Large Exudate Type: Serosanguineous Exudate Color: red, brown Foul Odor After Cleansing: No Slough/Fibrino Yes Wound Bed Granulation Amount: Medium (34-66%) Exposed Structure Granulation Quality: Red, Hyper-granulation Fat Layer (Subcutaneous Tissue) Exposed: Yes Necrotic Amount: Medium (34-66%) Necrotic Quality: Adherent Slough Treatment Notes Wound #1 (Lower Leg) Wound Laterality: Left, Lateral Cleanser Peri-Wound Care Topical Santyl Collagenase Ointment, 30 (gm), tube Discharge Instruction: apply nickel thick to wound bed only Primary Dressing Veronica Krueger, Veronica Krueger ([id number]) Hydrofera Blue Ready Transfer Foam, 2.5x2.5 (in/in) Discharge Instruction: Apply Hydrofera Blue Ready to wound bed as directed Secondary Dressing Zetuvit Plus Silicone Border Dressing 5x5 (in/in) Secured With Tubigrip Size G, 4.5x10 (in/yd) Discharge Instruction: Apply Tubigrip G 3-finger-widths below knee to base of toes to secure dressing and/or for swelling. Compression Wrap Compression Stockings Add-Ons Electronic Signature(s) Signed: 03/17/2021 4:44:26 PM By: Levora Dredge Entered By: Levora Dredge on 03/17/2021 15:33:26 Veronica Krueger, Veronica Krueger ([id number]) -------------------------------------------------------------------------------- Vitals Details Patient Name: Veronica Krueger Date of Service: 03/17/2021 3:00 PM Medical Record Number: [id number] Patient Account Number: [id number] Date of Birth/Sex: [birth year] (65 y.o. Female) Treating RN: Levora Dredge Primary Care Lacosta Hargan: [place] [employer] Other Clinician: Referring Seyed Heffley: [place] [employer] Treating Cedar Ditullio/Extender: Skipper Cliche in Treatment: 2 Vital Signs Time Taken: 15:24 Temperature (F): 98.2 Height (in): 69 Pulse (bpm): 69 Respiratory Rate (breaths/min): 18 Blood Pressure (mmHg): 169/72 Reference  Range: 80 - 120 mg / dl Notes pt asymptomatic with increased BP states she took her BO meds this morning Electronic Signature(s) Signed: 03/17/2021 4:44:26 PM By: Levora Dredge Entered By: Levora Dredge on 03/17/2021 15:27:44

## 2021-03-17 NOTE — Progress Notes (Signed)
Veronica Krueger, Veronica Krueger ([id number]) Visit Report for 03/17/2021 Chief Complaint Document Details Patient Name: Veronica Krueger, Veronica Krueger Date of Service: 03/17/2021 3:00 PM Medical Record Number: [id number] Patient Account Number: [id number] Date of Birth/Sex: 18-Jun-1955 (66 y.o. Female) Treating RN: Levora Dredge Primary Care Provider: Georgian Co Other Clinician: Referring Provider: Georgian Co Treating Provider/Extender: Skipper Cliche in Treatment: 2 Information Obtained from: Patient Chief Complaint Left LE Ulcer Electronic Signature(s) Signed: 03/17/2021 3:03:16 PM By: Worthy Keeler PA-C Entered By: Worthy Keeler on 03/17/2021 15:03:15 Veronica Krueger ([id number]) -------------------------------------------------------------------------------- HPI Details Patient Name: Veronica Krueger Date of Service: 03/17/2021 3:00 PM Medical Record Number: [id number] Patient Account Number: [id number] Date of Birth/Sex: 03-07-1955 (66 y.o. Female) Treating RN: Levora Dredge Primary Care Provider: Georgian Co Other Clinician: Referring Provider: Georgian Co Treating Provider/Extender: Skipper Cliche in Treatment: 2 History of Present Illness HPI Description: 03/03/2021 upon evaluation today patient appears to be doing somewhat poorly in regard to the wound on her left lateral lower extremity. She tells me this has been there since around October 2022. It came up as a blister and she is never had anything like this before. With that being said she currently is concerned about the fact that her home health nurse with [facility] was telling her that this could potentially be infected. With that being said I am really not seeing evidence or signs of infection at this time. Right now they have been using Santyl and then along with the Santyl a wet-to-dry dressing. No compressions been utilized. She did have arterial studies when she was in the hospital in November showed that she had  an ABI on the right of 0.78 on the left and 0.75 which is obviously not optimal. The patient does have peripheral vascular disease, diabetes mellitus type 2 though she tells me is diet controlled she takes no medicines and does not check her blood sugars. She also has a history of hypertension and chronic venous insufficiency along with lymphedema that I see today. 03/17/2021 upon evaluation today patient appears to be doing okay in regard to her wound though still appears to be very sloughy. She is also having some increased pain and according to the home health nurse, nurse here in the clinic, the patient this appears to have some odor as well. Again I am not able to smell this but again that is more of a personal issue with my olfactory sense since COVID. Either way I think that right now the concern is this possibly is an infection we do need to address that. I do not want her to get worse by any means. We are still waiting on her to get scheduled for the arterial study which I think needs to be done ASAP. Electronic Signature(s) Signed: 03/17/2021 3:50:37 PM By: Worthy Keeler PA-C Entered By: Worthy Keeler on 03/17/2021 15:50:37 Veronica Krueger ([id number]) -------------------------------------------------------------------------------- Physical Exam Details Patient Name: Veronica Krueger Date of Service: 03/17/2021 3:00 PM Medical Record Number: [id number] Patient Account Number: [id number] Date of Birth/Sex: 10-26-1955 (66 y.o. Female) Treating RN: Levora Dredge Primary Care Provider: Georgian Co Other Clinician: Referring Provider: Georgian Co Treating Provider/Extender: Jeri Cos Weeks in Treatment: 2 Constitutional Well-nourished and well-hydrated in no acute distress. Respiratory normal breathing without difficulty. Psychiatric this patient is able to make decisions and demonstrates good insight into disease process. Alert and Oriented x 3. pleasant and  cooperative. Notes Upon inspection patient's wound bed actually showed signs of good granulation and epithelization at this point. Fortunately there does not  appear to be any evidence of active infection systemically at this time. However locally she may definitely have some early signs of cellulitis. Obviously this needs to be addressed. No debridement was performed today were still awaiting arterial studies before proceeding with any debridement and stronger compression both which I think she could benefit from. Electronic Signature(s) Signed: 03/17/2021 3:51:28 PM By: Worthy Keeler PA-C Entered By: Worthy Keeler on 03/17/2021 15:51:28 Veronica Krueger ([id number]) -------------------------------------------------------------------------------- Physician Orders Details Patient Name: Veronica Krueger Date of Service: 03/17/2021 3:00 PM Medical Record Number: [id number] Patient Account Number: [id number] Date of Birth/Sex: Jul 15, 1955 (66 y.o. Female) Treating RN: Levora Dredge Primary Care Provider: Georgian Co Other Clinician: Referring Provider: Georgian Co Treating Provider/Extender: Skipper Cliche in Treatment: 2 Verbal / Phone Orders: No Diagnosis Coding ICD-10 Coding Code Description I89.0 Lymphedema, not elsewhere classified L97.822 Non-pressure chronic ulcer of other part of left lower leg with fat layer exposed I73.89 Other specified peripheral vascular diseases I87.2 Venous insufficiency (chronic) (peripheral) I10 Essential (primary) hypertension E11.622 Type 2 diabetes mellitus with other skin ulcer Follow-up Appointments o Return Appointment in 1 week. o Nurse Visit as needed o Other: - please call vascular if you do not hear from them by tomorrow [place] for wound care. May utilize formulary equivalent dressing for wound treatment orders unless otherwise specified. Home Health  Nurse may visit [facility] to address patientos wound care needs. o **Please direct any NON-WOUND related issues/requests for orders to patient's Primary Care Physician. **If current dressing causes regression in wound condition, may D/C ordered dressing product/s and apply Normal Saline Moist Dressing daily until next [place] or Other MD appointment. **Notify [facility] of regression in wound condition at [phone]. o Other Home Health Orders/Instructions: - if using hydrafera blue with the covering to back with wording on it, please cut slits into back of hydrfera blue prior to apply to wound bed. Bathing/ Shower/ Hygiene o Wash wounds with antibacterial soap and water. o No tub bath. Edema Control - Lymphedema / Segmental Compressive Device / Other Bilateral Lower Extremities o Elevate, Exercise Daily and Avoid Standing for Long Periods of Time. o Elevate legs to the level of the heart and pump ankles as often as possible o Elevate leg(s) parallel to the floor when sitting. o DO YOUR BEST to sleep in the bed at night. DO NOT sleep in your recliner. Long hours of sitting in a recliner leads to swelling of the legs and/or potential wounds on your backside. Wound Treatment Wound #1 - Lower Leg Wound Laterality: Left, Lateral Topical: Santyl Collagenase Ointment, 30 (gm), tube 3 x Per Week/30 Days Discharge Instructions: apply nickel thick to wound bed only Primary Dressing: Hydrofera Blue Ready Transfer Foam, 2.5x2.5 (in/in) 3 x Per Week/30 Days Discharge Instructions: Apply Hydrofera Blue Ready to wound bed as directed Secondary Dressing: Zetuvit Plus Silicone Border Dressing 5x5 (in/in) 3 x Per Week/30 Days Secured With: Tubigrip Size G, 4.5x10 (in/yd) 3 x Per Week/30 Days Discharge Instructions: Apply Tubigrip G 3-finger-widths below knee to base of toes to secure dressing and/or for swelling. Laboratory o Bacteria identified in Wound by Culture  (MICRO) - left lower leg wound Veronica Krueger, Veronica Krueger ([id number]) oooo LOINC Code: 4128-7 OMVE Convenience Name: Wound culture routine Patient Medications Allergies: penicillin, gabapentin Notifications Medication Indication Start End doxycycline hyclate 03/17/2021 DOSE 1 - oral 100 mg capsule - 1 capsule oral taken 2 times per day for 14  days Electronic Signature(s) Signed: 03/17/2021 4:09:52 PM By: Worthy Keeler PA-C Entered By: Worthy Keeler on 03/17/2021 16:09:52 Veronica Krueger, Veronica Krueger ([id number]) -------------------------------------------------------------------------------- Problem List Details Patient Name: Veronica Krueger Date of Service: 03/17/2021 3:00 PM Medical Record Number: [id number] Patient Account Number: [id number] Date of Birth/Sex: 1955-08-24 (65 y.o. Female) Treating RN: Levora Dredge Primary Care Provider: Georgian Co Other Clinician: Referring Provider: Georgian Co Treating Provider/Extender: Skipper Cliche in Treatment: 2 Active Problems ICD-10 Encounter Code Description Active Date MDM Diagnosis I89.0 Lymphedema, not elsewhere classified 03/03/2021 No Yes L97.822 Non-pressure chronic ulcer of other part of left lower leg with fat layer 03/03/2021 No Yes exposed I73.89 Other specified peripheral vascular diseases 03/03/2021 No Yes I87.2 Venous insufficiency (chronic) (peripheral) 03/03/2021 No Yes I10 Essential (primary) hypertension 03/03/2021 No Yes E11.622 Type 2 diabetes mellitus with other skin ulcer 03/03/2021 No Yes Inactive Problems Resolved Problems Electronic Signature(s) Signed: 03/17/2021 3:03:11 PM By: Worthy Keeler PA-C Entered By: Worthy Keeler on 03/17/2021 15:03:11 Veronica Krueger ([id number]) -------------------------------------------------------------------------------- Progress Note Details Patient Name: Veronica Krueger Date of Service: 03/17/2021 3:00 PM Medical Record Number: [id number] Patient Account Number:  [id number] Date of Birth/Sex: 11-Sep-1955 (65 y.o. Female) Treating RN: Levora Dredge Primary Care Provider: Georgian Co Other Clinician: Referring Provider: Georgian Co Treating Provider/Extender: Skipper Cliche in Treatment: 2 Subjective Chief Complaint Information obtained from Patient Left LE Ulcer History of Present Illness (HPI) 03/03/2021 upon evaluation today patient appears to be doing somewhat poorly in regard to the wound on her left lateral lower extremity. She tells me this has been there since around October 2022. It came up as a blister and she is never had anything like this before. With that being said she currently is concerned about the fact that her home health nurse with [facility] was telling her that this could potentially be infected. With that being said I am really not seeing evidence or signs of infection at this time. Right now they have been using Santyl and then along with the Santyl a wet-to-dry dressing. No compressions been utilized. She did have arterial studies when she was in the hospital in November showed that she had an ABI on the right of 0.78 on the left and 0.75 which is obviously not optimal. The patient does have peripheral vascular disease, diabetes mellitus type 2 though she tells me is diet controlled she takes no medicines and does not check her blood sugars. She also has a history of hypertension and chronic venous insufficiency along with lymphedema that I see today. 03/17/2021 upon evaluation today patient appears to be doing okay in regard to her wound though still appears to be very sloughy. She is also having some increased pain and according to the home health nurse, nurse here in the clinic, the patient this appears to have some odor as well. Again I am not able to smell this but again that is more of a personal issue with my olfactory sense since COVID. Either way I think that right now the concern is this possibly is an infection we  do need to address that. I do not want her to get worse by any means. We are still waiting on her to get scheduled for the arterial study which I think needs to be done ASAP. Objective Constitutional Well-nourished and well-hydrated in no acute distress. Vitals Time Taken: 3:24 PM, Height: 69 in, Temperature: 98.2 F, Pulse: 69 bpm, Respiratory Rate: 18 breaths/min, Blood Pressure: 169/72 mmHg. General Notes: pt asymptomatic with increased BP  states she took her BO meds this morning Respiratory normal breathing without difficulty. Psychiatric this patient is able to make decisions and demonstrates good insight into disease process. Alert and Oriented x 3. pleasant and cooperative. General Notes: Upon inspection patient's wound bed actually showed signs of good granulation and epithelization at this point. Fortunately there does not appear to be any evidence of active infection systemically at this time. However locally she may definitely have some early signs of cellulitis. Obviously this needs to be addressed. No debridement was performed today were still awaiting arterial studies before proceeding with any debridement and stronger compression both which I think she could benefit from. Integumentary (Hair, Skin) Wound #1 status is Open. Original cause of wound was Blister. The date acquired was: 12/04/2020. The wound has been in treatment 2 weeks. The wound is located on the Left,Lateral Lower Leg. The wound measures 5cm length x 6.4cm width x 0.2cm depth; 25.133cm^2 area and 5.027cm^3 volume. There is Fat Layer (Subcutaneous Tissue) exposed. There is no tunneling or undermining noted. There is a large amount of serosanguineous drainage noted. There is medium (34-66%) red, hyper - granulation within the wound bed. There is a medium (34-66%) amount of necrotic tissue within the wound bed including Adherent Slough. Assessment Veronica Krueger, Veronica Krueger ([id number]) Active Problems ICD-10 Lymphedema,  not elsewhere classified Non-pressure chronic ulcer of other part of left lower leg with fat layer exposed Other specified peripheral vascular diseases Venous insufficiency (chronic) (peripheral) Essential (primary) hypertension Type 2 diabetes mellitus with other skin ulcer Plan Follow-up Appointments: Return Appointment in 1 week. Nurse Visit as needed Other: - please call vascular if you do not hear from them by tomorrow Home Health: [place]: - [place] for wound care. May utilize formulary equivalent dressing for wound treatment orders unless otherwise specified. Home Health Nurse may visit [facility] to address patient s wound care needs. **Please direct any NON-WOUND related issues/requests for orders to patient's Primary Care Physician. **If current dressing causes regression in wound condition, may D/C ordered dressing product/s and apply Normal Saline Moist Dressing daily until next [place] or Other MD appointment. **Notify [facility] of regression in wound condition at [phone]. Other Home Health Orders/Instructions: - if using hydrafera blue with the covering to back with wording on it, please cut slits into back of hydrfera blue prior to apply to wound bed. Bathing/ Shower/ Hygiene: Wash wounds with antibacterial soap and water. No tub bath. Edema Control - Lymphedema / Segmental Compressive Device / Other: Elevate, Exercise Daily and Avoid Standing for Long Periods of Time. Elevate legs to the level of the heart and pump ankles as often as possible Elevate leg(s) parallel to the floor when sitting. DO YOUR BEST to sleep in the bed at night. DO NOT sleep in your recliner. Long hours of sitting in a recliner leads to swelling of the legs and/or potential wounds on your backside. Laboratory ordered were: Wound culture routine - left lower leg wound The following medication(s) was prescribed: doxycycline hyclate oral 100 mg  capsule 1 1 capsule oral taken 2 times per day for 14 days starting 03/17/2021 WOUND #1: - Lower Leg Wound Laterality: Left, Lateral Topical: Santyl Collagenase Ointment, 30 (gm), tube 3 x Per Week/30 Days Discharge Instructions: apply nickel thick to wound bed only Primary Dressing: Hydrofera Blue Ready Transfer Foam, 2.5x2.5 (in/in) 3 x Per Week/30 Days Discharge Instructions: Apply Hydrofera Blue Ready to wound bed as directed Secondary Dressing: Zetuvit Plus Silicone  Border Dressing 5x5 (in/in) 3 x Per Week/30 Days Secured With: Tubigrip Size G, 4.5x10 (in/yd) 3 x Per Week/30 Days Discharge Instructions: Apply Tubigrip G 3-finger-widths below knee to base of toes to secure dressing and/or for swelling. 1. My suggestion currently is can be that we go ahead and initiate treatment with doxycycline. I think that is can be the best way to go. 2. I am also can recommend at this time that we have the patient continue with the [facility] we will use the Santyl underneath this at home as well. 3. I am also going to suggest that we continue with the Tubigrip currently although I would really prefer to have her in a compression wrap we need to get her to vascular to get her arterial status checked before we can proceed with anything further here. We will see patient back for reevaluation in 1 week here in the clinic. If anything worsens or changes patient will contact our office for additional recommendations. Electronic Signature(s) Signed: 03/17/2021 4:10:12 PM By: Worthy Keeler PA-C Previous Signature: 03/17/2021 3:55:45 PM Version By: Worthy Keeler PA-C Entered By: Worthy Keeler on 03/17/2021 16:10:12 Veronica Krueger ([id number]) -------------------------------------------------------------------------------- SuperBill Details Patient Name: Veronica Krueger Date of Service: 03/17/2021 Medical Record Number: [id number] Patient Account Number: [id number] Date of Birth/Sex: 03-10-1955  (65 y.o. Female) Treating RN: Levora Dredge Primary Care Provider: Georgian Co Other Clinician: Referring Provider: Georgian Co Treating Provider/Extender: Skipper Cliche in Treatment: 2 Diagnosis Coding ICD-10 Codes Code Description I89.0 Lymphedema, not elsewhere classified L97.822 Non-pressure chronic ulcer of other part of left lower leg with fat layer exposed I73.89 Other specified peripheral vascular diseases I87.2 Venous insufficiency (chronic) (peripheral) I10 Essential (primary) hypertension E11.622 Type 2 diabetes mellitus with other skin ulcer Facility Procedures CPT4 Code: [id number] Description: [phone] - WOUND CARE VISIT-LEV 2 EST PT Modifier: Quantity: 1 Physician Procedures CPT4 Code: [id number] Description: 86381 - WC PHYS LEVEL 4 - EST PT Modifier: Quantity: 1 CPT4 Code: Description: ICD-10 Diagnosis Description I89.0 Lymphedema, not elsewhere classified L97.822 Non-pressure chronic ulcer of other part of left lower leg with fat lay I73.89 Other specified peripheral vascular diseases I87.2 Venous insufficiency (chronic)  (peripheral) Modifier: er exposed Quantity: Electronic Signature(s) Signed: 03/17/2021 4:10:46 PM By: Worthy Keeler PA-C Entered By: Worthy Keeler on 03/17/2021 16:10:45

## 2021-03-18 ENCOUNTER — Other Ambulatory Visit
Admission: RE | Admit: 2021-03-18 | Discharge: 2021-03-18 | Disposition: A | Discharge status: Home / Self Care | Payer: Medicare HMO | Source: Ambulatory Visit | Attending: Physician Assistant | Admitting: Physician Assistant

## 2021-03-18 DIAGNOSIS — B999 Unspecified infectious disease: Secondary | ICD-10-CM | POA: Diagnosis not present

## 2021-03-20 LAB — AEROBIC CULTURE W GRAM STAIN (SUPERFICIAL SPECIMEN)

## 2021-03-24 ENCOUNTER — Other Ambulatory Visit: Payer: Self-pay

## 2021-03-24 ENCOUNTER — Encounter: Payer: Medicare HMO | Admitting: Physician Assistant

## 2021-03-24 DIAGNOSIS — I890 Lymphedema, not elsewhere classified: Secondary | ICD-10-CM | POA: Diagnosis not present

## 2021-03-24 DIAGNOSIS — E1151 Type 2 diabetes mellitus with diabetic peripheral angiopathy without gangrene: Secondary | ICD-10-CM | POA: Diagnosis not present

## 2021-03-24 DIAGNOSIS — E11622 Type 2 diabetes mellitus with other skin ulcer: Secondary | ICD-10-CM | POA: Diagnosis not present

## 2021-03-24 DIAGNOSIS — M109 Gout, unspecified: Secondary | ICD-10-CM | POA: Diagnosis not present

## 2021-03-24 DIAGNOSIS — I872 Venous insufficiency (chronic) (peripheral): Secondary | ICD-10-CM | POA: Diagnosis not present

## 2021-03-24 DIAGNOSIS — I10 Essential (primary) hypertension: Secondary | ICD-10-CM | POA: Diagnosis not present

## 2021-03-24 DIAGNOSIS — L97822 Non-pressure chronic ulcer of other part of left lower leg with fat layer exposed: Secondary | ICD-10-CM | POA: Diagnosis not present

## 2021-03-24 NOTE — Progress Notes (Signed)
Veronica Krueger, Veronica Krueger ([id number]) Visit Report for 03/24/2021 Arrival Information Details Patient Name: Veronica Krueger, Veronica Krueger Date of Service: 03/24/2021 3:00 PM Medical Record Number: [id number] Patient Account Number: [id number] Date of Birth/Sex: [birth year] (66 y.o. F) Treating RN: Levora Dredge Primary Care Pio Eatherly: [place] [employer] Other Clinician: Referring Uriyah Raska: [place] [employer] Treating Miniya Miguez/Extender: Skipper Cliche in Treatment: 3 Visit Information History Since Last Visit Added or deleted any medications: No Patient Arrived: Wheel Chair Any new allergies or adverse reactions: No Arrival Time: 15:10 Had a fall or experienced change in No Accompanied By: self activities of daily living that may affect Transfer Assistance: EasyPivot Patient Lift risk of falls: Patient Identification Verified: Yes Hospitalized since last visit: No Secondary Verification Process Completed: Yes Has Dressing in Place as Prescribed: Yes Patient Has Alerts: Yes Has Compression in Place as Prescribed: Yes Patient Alerts: diabetic type 2 Pain Present Now: No ABI 01/06/21 L 0.75 R 0.7 Electronic Signature(s) Signed: 03/24/2021 5:07:33 PM By: Levora Dredge Entered By: Levora Dredge on 03/24/2021 15:15:52 Veronica Krueger ([id number]) -------------------------------------------------------------------------------- Clinic Level of Care Assessment Details Patient Name: Veronica Krueger Date of Service: 03/24/2021 3:00 PM Medical Record Number: [id number] Patient Account Number: [id number] Date of Birth/Sex: 09/01/1955 (66 y.o. F) Treating RN: Levora Dredge Primary Care Jerusalem Wert: [place] [employer] Other Clinician: Referring Kendrik Mcshan: [place] [employer] Treating Issa Kosmicki/Extender: Skipper Cliche in Treatment: 3 Clinic Level of Care Assessment Items TOOL 4 Quantity Score []  - Use when only an EandM is performed on FOLLOW-UP visit 0 ASSESSMENTS - Nursing Assessment / Reassessment []  -  Reassessment of Co-morbidities (includes updates in patient status) 0 []  - 0 Reassessment of Adherence to Treatment Plan ASSESSMENTS - Wound and Skin Assessment / Reassessment X - Simple Wound Assessment / Reassessment - one wound 1 5 []  - 0 Complex Wound Assessment / Reassessment - multiple wounds []  - 0 Dermatologic / Skin Assessment (not related to wound area) ASSESSMENTS - Focused Assessment []  - Circumferential Edema Measurements - multi extremities 0 []  - 0 Nutritional Assessment / Counseling / Intervention []  - 0 Lower Extremity Assessment (monofilament, tuning fork, pulses) []  - 0 Peripheral Arterial Disease Assessment (using hand held doppler) ASSESSMENTS - Ostomy and/or Continence Assessment and Care []  - Incontinence Assessment and Management 0 []  - 0 Ostomy Care Assessment and Management (repouching, etc.) PROCESS - Coordination of Care X - Simple Patient / Family Education for ongoing care 1 15 []  - 0 Complex (extensive) Patient / Family Education for ongoing care []  - 0 Staff obtains Programmer, systems, Records, Test Results / Process Orders []  - 0 Staff telephones HHA, Nursing Homes / Clarify orders / etc []  - 0 Routine Transfer to another Facility (non-emergent condition) []  - 0 Routine Hospital Admission (non-emergent condition) []  - 0 New Admissions / Biomedical engineer / Ordering NPWT, Apligraf, etc. []  - 0 Emergency Hospital Admission (emergent condition) X- 1 10 Simple Discharge Coordination []  - 0 Complex (extensive) Discharge Coordination PROCESS - Special Needs []  - Pediatric / Minor Patient Management 0 []  - 0 Isolation Patient Management []  - 0 Hearing / Language / Visual special needs []  - 0 Assessment of Community assistance (transportation, D/C planning, etc.) []  - 0 Additional assistance / Altered mentation []  - 0 Support Surface(s) Assessment (bed, cushion, seat, etc.) INTERVENTIONS - Wound Cleansing / Measurement Veronica Krueger, Veronica Krueger  ([id number]) X- 1 5 Simple Wound Cleansing - one wound []  - 0 Complex Wound Cleansing - multiple wounds X- 1 5 Wound Imaging (photographs - any number of wounds) []  - 0 Wound Tracing (instead  of photographs) X- 1 5 Simple Wound Measurement - one wound []  - 0 Complex Wound Measurement - multiple wounds INTERVENTIONS - Wound Dressings X - Small Wound Dressing one or multiple wounds 1 10 []  - 0 Medium Wound Dressing one or multiple wounds []  - 0 Large Wound Dressing one or multiple wounds []  - 0 Application of Medications - topical []  - 0 Application of Medications - injection INTERVENTIONS - Miscellaneous []  - External ear exam 0 []  - 0 Specimen Collection (cultures, biopsies, blood, body fluids, etc.) []  - 0 Specimen(s) / Culture(s) sent or taken to Lab for analysis []  - 0 Patient Transfer (multiple staff / Civil Service fast streamer / Similar devices) []  - 0 Simple Staple / Suture removal (25 or less) []  - 0 Complex Staple / Suture removal (26 or more) []  - 0 Hypo / Hyperglycemic Management (close monitor of Blood Glucose) []  - 0 Ankle / Brachial Index (ABI) - do not check if billed separately X- 1 5 Vital Signs Has the patient been seen at the hospital within the last three years: Yes Total Score: 60 Level Of Care: New/Established - Level 2 Electronic Signature(s) Signed: 03/24/2021 5:07:33 PM By: Levora Dredge Entered By: Levora Dredge on 03/24/2021 16:56:21 Veronica Krueger ([id number]) -------------------------------------------------------------------------------- Encounter Discharge Information Details Patient Name: Veronica Krueger Date of Service: 03/24/2021 3:00 PM Medical Record Number: [id number] Patient Account Number: [id number] Date of Birth/Sex: 1955-06-21 (66 y.o. F) Treating RN: Levora Dredge Primary Care Derak Schurman: [place] [employer] Other Clinician: Referring Virginie Josten: [place] [employer] Treating Maysin Carstens/Extender: Skipper Cliche in Treatment: 3 Encounter  Discharge Information Items Discharge Condition: Stable Ambulatory Status: Wheelchair Discharge Destination: Home Transportation: Private Auto Accompanied By: mother Schedule Follow-up Appointment: Yes Clinical Summary of Care: Electronic Signature(s) Signed: 03/24/2021 4:57:32 PM By: Levora Dredge Entered By: Levora Dredge on 03/24/2021 16:57:32 Veronica Krueger ([id number]) -------------------------------------------------------------------------------- Lower Extremity Assessment Details Patient Name: Veronica Krueger Date of Service: 03/24/2021 3:00 PM Medical Record Number: [id number] Patient Account Number: [id number] Date of Birth/Sex: [birth year] (65 y.o. F) Treating RN: Levora Dredge Primary Care Reina Wilton: [place] [employer] Other Clinician: Referring Jream Broyles: [place] [employer] Treating Esgar Barnick/Extender: Jeri Cos Weeks in Treatment: 3 Edema Assessment Assessed: [Left: No] [Right: No] Edema: [Left: Ye] [Right: s] Calf Left: Right: Point of Measurement: 32 cm From Medial Instep 62 cm Ankle Left: Right: Point of Measurement: 9 cm From Medial Instep 43.9 cm Vascular Assessment Pulses: Dorsalis Pedis Palpable: [Left:Yes] Electronic Signature(s) Signed: 03/24/2021 5:07:33 PM By: Levora Dredge Entered By: Levora Dredge on 03/24/2021 15:31:54 Veronica Krueger ([id number]) -------------------------------------------------------------------------------- Multi Wound Chart Details Patient Name: Veronica Krueger Date of Service: 03/24/2021 3:00 PM Medical Record Number: [id number] Patient Account Number: [id number] Date of Birth/Sex: [birth year] (65 y.o. F) Treating RN: Levora Dredge Primary Care Camani Sesay: [place] [employer] Other Clinician: Referring Earlene Bjelland: [place] [employer] Treating Mansfield Dann/Extender: Skipper Cliche in Treatment: 3 Vital Signs Height(in): 69 Pulse(bpm): 72 Weight(lbs): Blood Pressure(mmHg): 175/86 Body Mass Index(BMI): Temperature(F):  98.1 Respiratory Rate(breaths/min): 18 Photos: [N/A:N/A] Wound Location: Left, Lateral Lower Leg N/A N/A Wounding Event: Blister N/A N/A Primary Etiology: Diabetic Wound/Ulcer of the Lower N/A N/A Extremity Comorbid History: Glaucoma, Anemia, Lymphedema, N/A N/A Hypertension, Type II Diabetes, Gout Date Acquired: 12/04/2020 N/A N/A Weeks of Treatment: 3 N/A N/A Wound Status: Open N/A N/A Wound Recurrence: No N/A N/A Measurements L x W x D (cm) 5x6.9x0.2 N/A N/A Area (cm) : 27.096 N/A N/A Volume (cm) : 5.419 N/A N/A % Reduction in Area: -26.50% N/A N/A % Reduction in Volume: -26.50% N/A N/A Classification: Grade 1  N/A N/A Exudate Amount: Large N/A N/A Exudate Type: Serosanguineous N/A N/A Exudate Color: red, brown N/A N/A Granulation Amount: Small (1-33%) N/A N/A Granulation Quality: Red, Hyper-granulation N/A N/A Necrotic Amount: Large (67-100%) N/A N/A Exposed Structures: Fat Layer (Subcutaneous Tissue): N/A N/A Yes Epithelialization: Small (1-33%) N/A N/A Treatment Notes Electronic Signature(s) Signed: 03/24/2021 5:07:33 PM By: Levora Dredge Entered By: Levora Dredge on 03/24/2021 15:41:08 Veronica Krueger ([id number]) -------------------------------------------------------------------------------- Multi-Disciplinary Care Plan Details Patient Name: Veronica Krueger Date of Service: 03/24/2021 3:00 PM Medical Record Number: [id number] Patient Account Number: [id number] Date of Birth/Sex: [birth year] (65 y.o. F) Treating RN: Levora Dredge Primary Care Sohil Timko: [place] [employer] Other Clinician: Referring Jory Welke: [place] [employer] Treating Attie Nawabi/Extender: Skipper Cliche in Treatment: 3 Active Inactive Wound/Skin Impairment Nursing Diagnoses: Impaired tissue integrity Knowledge deficit related to ulceration/compromised skin integrity Goals: Patient will have a decrease in wound volume by X% from date: (specify in notes) Date Initiated: 03/03/2021 Target  Resolution Date: 03/31/2021 Goal Status: Active Patient/caregiver will verbalize understanding of skin care regimen Date Initiated: 03/03/2021 Target Resolution Date: 03/31/2021 Goal Status: Active Ulcer/skin breakdown will have a volume reduction of 30% by week 4 Date Initiated: 03/03/2021 Target Resolution Date: 04/28/2021 Goal Status: Active Ulcer/skin breakdown will have a volume reduction of 50% by week 8 Date Initiated: 03/03/2021 Target Resolution Date: 05/26/2021 Goal Status: Active Ulcer/skin breakdown will have a volume reduction of 80% by week 12 Date Initiated: 03/03/2021 Target Resolution Date: 06/23/2021 Goal Status: Active Ulcer/skin breakdown will heal within 14 weeks Date Initiated: 03/03/2021 Target Resolution Date: 07/07/2021 Goal Status: Active Interventions: Assess patient/caregiver ability to obtain necessary supplies Assess patient/caregiver ability to perform ulcer/skin care regimen upon admission and as needed Assess ulceration(s) every visit Provide education on ulcer and skin care Notes: Electronic Signature(s) Signed: 03/24/2021 5:07:33 PM By: Levora Dredge Entered By: Levora Dredge on 03/24/2021 15:40:53 Veronica Krueger ([id number]) -------------------------------------------------------------------------------- Pain Assessment Details Patient Name: Veronica Krueger Date of Service: 03/24/2021 3:00 PM Medical Record Number: [id number] Patient Account Number: [id number] Date of Birth/Sex: 17-Jun-1955 (66 y.o. F) Treating RN: Levora Dredge Primary Care Sanjna Haskew: [place] [employer] Other Clinician: Referring Jannessa Ogden: [place] [employer] Treating Sharlon Pfohl/Extender: Skipper Cliche in Treatment: 3 Active Problems Location of Pain Severity and Description of Pain Patient Has Paino Yes Site Locations Rate the pain. Current Pain Level: 2 Pain Management and Medication Current Pain Management: Notes pt states constant dull burning pain Electronic  Signature(s) Signed: 03/24/2021 5:07:33 PM By: Levora Dredge Entered By: Levora Dredge on 03/24/2021 15:23:00 Veronica Krueger ([id number]) -------------------------------------------------------------------------------- Patient/Caregiver Education Details Patient Name: Veronica Krueger Date of Service: 03/24/2021 3:00 PM Medical Record Number: [id number] Patient Account Number: [id number] Date of Birth/Gender: 07-27-1955 (65 y.o. F) Treating RN: Levora Dredge Primary Care Physician: [place] [employer] Other Clinician: Referring Physician: [place] [employer] Treating Physician/Extender: Skipper Cliche in Treatment: 3 Education Assessment Education Provided To: Patient Education Topics Provided Wound/Skin Impairment: Handouts: Caring for Your Ulcer Methods: Explain/Verbal Responses: State content correctly Electronic Signature(s) Signed: 03/24/2021 5:07:33 PM By: Levora Dredge Entered By: Levora Dredge on 03/24/2021 16:56:39 Veronica Krueger ([id number]) -------------------------------------------------------------------------------- Wound Assessment Details Patient Name: Veronica Krueger Date of Service: 03/24/2021 3:00 PM Medical Record Number: [id number] Patient Account Number: [id number] Date of Birth/Sex: 06/20/1955 (65 y.o. F) Treating RN: Levora Dredge Primary Care Jaeger Trueheart: [place] [employer] Other Clinician: Referring Taejon Irani: [place] [employer] Treating Carlethia Mesquita/Extender: Jeri Cos Weeks in Treatment: 3 Wound Status Wound Number: 1 Primary Diabetic Wound/Ulcer of the Lower Extremity Etiology: Wound Location: Left, Lateral Lower Leg Wound Status: Open Wounding Event: Blister Comorbid  Glaucoma, Anemia, Lymphedema, Hypertension, Type Date Acquired: 12/04/2020 History: II Diabetes, Gout Weeks Of Treatment: 3 Clustered Wound: No Photos Wound Measurements Length: (cm) 5 Width: (cm) 6.9 Depth: (cm) 0.2 Area: (cm) 27.096 Volume: (cm) 5.419 % Reduction in  Area: -26.5% % Reduction in Volume: -26.5% Epithelialization: Small (1-33%) Tunneling: No Undermining: No Wound Description Classification: Grade 1 Exudate Amount: Large Exudate Type: Serosanguineous Exudate Color: red, brown Foul Odor After Cleansing: No Slough/Fibrino Yes Wound Bed Granulation Amount: Small (1-33%) Exposed Structure Granulation Quality: Red, Hyper-granulation Fat Layer (Subcutaneous Tissue) Exposed: Yes Necrotic Amount: Large (67-100%) Necrotic Quality: Adherent Slough Treatment Notes Wound #1 (Lower Leg) Wound Laterality: Left, Lateral Cleanser Peri-Wound Care Topical Primary Dressing Hydrofera Blue Ready Transfer Foam, 2.5x2.5 (in/in) Discharge Instruction: Apply Hydrofera Blue Ready to wound bed as directed Veronica Krueger, Veronica Krueger ([id number]) Secondary Dressing Zetuvit Plus Silicone Border Dressing 5x5 (in/in) Secured With Tubigrip Size G, 4.5x10 (in/yd) Discharge Instruction: Apply Tubigrip G 3-finger-widths below knee to base of toes to secure dressing and/or for swelling. Compression Wrap Compression Stockings Add-Ons Electronic Signature(s) Signed: 03/24/2021 5:07:33 PM By: Levora Dredge Entered By: Levora Dredge on 03/24/2021 15:29:44 Veronica Krueger, Veronica Krueger ([id number]) -------------------------------------------------------------------------------- Vitals Details Patient Name: Veronica Krueger Date of Service: 03/24/2021 3:00 PM Medical Record Number: [id number] Patient Account Number: [id number] Date of Birth/Sex: 07-Apr-1955 (65 y.o. F) Treating RN: Levora Dredge Primary Care Garek Schuneman: [place] [employer] Other Clinician: Referring Gianfranco Araki: [place] [employer] Treating Khari Mally/Extender: Skipper Cliche in Treatment: 3 Vital Signs Time Taken: 15:15 Temperature (F): 98.1 Height (in): 69 Pulse (bpm): 72 Respiratory Rate (breaths/min): 18 Blood Pressure (mmHg): 175/86 Reference Range: 80 - 120 mg / dl Electronic Signature(s) Signed: 03/24/2021  5:07:33 PM By: Levora Dredge Entered By: Levora Dredge on 03/24/2021 15:22:15

## 2021-03-24 NOTE — Progress Notes (Addendum)
Veronica Krueger, Veronica Krueger ([id number]) Visit Report for 03/24/2021 Chief Complaint Document Details Patient Name: Veronica Krueger Date of Service: 03/24/2021 3:00 PM Medical Record Number: [id number] Patient Account Number: [id number] Date of Birth/Sex: 1955/05/19 (66 y.o. F) Treating RN: Levora Dredge Primary Care Provider: Georgian Co Other Clinician: Referring Provider: Georgian Co Treating Provider/Extender: Skipper Cliche in Treatment: 3 Information Obtained from: Patient Chief Complaint Left LE Ulcer Electronic Signature(s) Signed: 03/24/2021 3:35:05 PM By: Worthy Keeler PA-C Entered By: Worthy Keeler on 03/24/2021 15:35:04 Veronica Krueger ([id number]) -------------------------------------------------------------------------------- HPI Details Patient Name: Veronica Krueger Date of Service: 03/24/2021 3:00 PM Medical Record Number: [id number] Patient Account Number: [id number] Date of Birth/Sex: 08-04-1955 (66 y.o. F) Treating RN: Levora Dredge Primary Care Provider: Georgian Co Other Clinician: Referring Provider: Georgian Co Treating Provider/Extender: Skipper Cliche in Treatment: 3 History of Present Illness HPI Description: 03/03/2021 upon evaluation today patient appears to be doing somewhat poorly in regard to the wound on her left lateral lower extremity. She tells me this has been there since around October 2022. It came up as a blister and she is never had anything like this before. With that being said she currently is concerned about the fact that her home health nurse with [facility] was telling her that this could potentially be infected. With that being said I am really not seeing evidence or signs of infection at this time. Right now they have been using Santyl and then along with the Santyl a wet-to-dry dressing. No compressions been utilized. She did have arterial studies when she was in the hospital in November showed that she had an ABI on  the right of 0.78 on the left and 0.75 which is obviously not optimal. The patient does have peripheral vascular disease, diabetes mellitus type 2 though she tells me is diet controlled she takes no medicines and does not check her blood sugars. She also has a history of hypertension and chronic venous insufficiency along with lymphedema that I see today. 03/17/2021 upon evaluation today patient appears to be doing okay in regard to her wound though still appears to be very sloughy. She is also having some increased pain and according to the home health nurse, nurse here in the clinic, the patient this appears to have some odor as well. Again I am not able to smell this but again that is more of a personal issue with my olfactory sense since COVID. Either way I think that right now the concern is this possibly is an infection we do need to address that. I do not want her to get worse by any means. We are still waiting on her to get scheduled for the arterial study which I think needs to be done ASAP. 03/24/2020 upon evaluation today patient appears to be doing well with regard to her wound. She has been tolerating the dressing changes and with the new Levaquin antibiotic she is doing much better as well. Overall I am extremely pleased with where we stand currently. Electronic Signature(s) Signed: 03/24/2021 5:12:47 PM By: Worthy Keeler PA-C Entered By: Worthy Keeler on 03/24/2021 17:12:46 Veronica Krueger, Veronica Krueger ([id number]) -------------------------------------------------------------------------------- Physical Exam Details Patient Name: Veronica Krueger Date of Service: 03/24/2021 3:00 PM Medical Record Number: [id number] Patient Account Number: [id number] Date of Birth/Sex: [birth year] (66 y.o. F) Treating RN: Levora Dredge Primary Care Provider: Georgian Co Other Clinician: Referring Provider: Georgian Co Treating Provider/Extender: Jeri Cos Weeks in Treatment:  3 Constitutional Obese and well-hydrated in no acute distress. Respiratory normal breathing without  difficulty. Psychiatric this patient is able to make decisions and demonstrates good insight into disease process. Alert and Oriented x 3. pleasant and cooperative. Notes Upon inspection patient's wound bed actually showed signs of good granulation and epithelization at this point. Fortunately I do not see any signs of active infection locally nor systemically which is great news and overall I think we are definitely on the right course at this point. Electronic Signature(s) Signed: 03/24/2021 5:13:02 PM By: Worthy Keeler PA-C Entered By: Worthy Keeler on 03/24/2021 17:13:02 Veronica Krueger ([id number]) -------------------------------------------------------------------------------- Physician Orders Details Patient Name: Veronica Krueger Date of Service: 03/24/2021 3:00 PM Medical Record Number: [id number] Patient Account Number: [id number] Date of Birth/Sex: [birth year] (66 y.o. F) Treating RN: Levora Dredge Primary Care Provider: Georgian Co Other Clinician: Referring Provider: Georgian Co Treating Provider/Extender: Skipper Cliche in Treatment: 3 Verbal / Phone Orders: No Diagnosis Coding ICD-10 Coding Code Description I89.0 Lymphedema, not elsewhere classified L97.822 Non-pressure chronic ulcer of other part of left lower leg with fat layer exposed I73.89 Other specified peripheral vascular diseases I87.2 Venous insufficiency (chronic) (peripheral) I10 Essential (primary) hypertension E11.622 Type 2 diabetes mellitus with other skin ulcer Follow-up Appointments o Return Appointment in 1 week. o Nurse Visit as needed o Other: - please call vascular if you do not hear from them by tomorrow [place] for wound care. May utilize formulary equivalent dressing for wound treatment orders  unless otherwise specified. Home Health Nurse may visit [facility] to address patientos wound care needs. o **Please direct any NON-WOUND related issues/requests for orders to patient's Primary Care Physician. **If current dressing causes regression in wound condition, may D/C ordered dressing product/s and apply Normal Saline Moist Dressing daily until next [place] or Other MD appointment. **Notify [facility] of regression in wound condition at [phone]. o Other Home Health Orders/Instructions: - if using hydrafera blue with the covering to back with wording on it, please cut slits into back of hydrfera blue prior to apply to wound bed. Bathing/ Shower/ Hygiene o Wash wounds with antibacterial soap and water. o No tub bath. Edema Control - Lymphedema / Segmental Compressive Device / Other Bilateral Lower Extremities o Elevate, Exercise Daily and Avoid Standing for Long Periods of Time. o Elevate legs to the level of the heart and pump ankles as often as possible o Elevate leg(s) parallel to the floor when sitting. o DO YOUR BEST to sleep in the bed at night. DO NOT sleep in your recliner. Long hours of sitting in a recliner leads to swelling of the legs and/or potential wounds on your backside. Wound Treatment Wound #1 - Lower Leg Wound Laterality: Left, Lateral Primary Dressing: Hydrofera Blue Ready Transfer Foam, 2.5x2.5 (in/in) 3 x Per Week/30 Days Discharge Instructions: Apply Hydrofera Blue Ready to wound bed as directed Secondary Dressing: Zetuvit Plus Silicone Border Dressing 5x5 (in/in) 3 x Per Week/30 Days Secured With: Tubigrip Size G, 4.5x10 (in/yd) 3 x Per Week/30 Days Discharge Instructions: Apply Tubigrip G 3-finger-widths below knee to base of toes to secure dressing and/or for swelling. Electronic Signature(s) Signed: 03/24/2021 5:07:33 PM By: Levora Dredge Signed: 03/24/2021 5:27:53 PM By: Worthy Keeler PA-C Entered By: Levora Dredge on 03/24/2021 15:42:09 Veronica Krueger, Veronica Krueger ([id number]) Veronica Krueger, Veronica Krueger ([id number]) -------------------------------------------------------------------------------- Problem List Details Patient Name: Veronica Krueger Date of Service: 03/24/2021 3:00 PM Medical Record Number: [id number] Patient Account Number: [id number] Date of Birth/Sex: 08/08/1955 (66 y.o. F) Treating RN:  Levora Dredge Primary Care Provider: Georgian Co Other Clinician: Referring Provider: Georgian Co Treating Provider/Extender: Skipper Cliche in Treatment: 3 Active Problems ICD-10 Encounter Code Description Active Date MDM Diagnosis I89.0 Lymphedema, not elsewhere classified 03/03/2021 No Yes L97.822 Non-pressure chronic ulcer of other part of left lower leg with fat layer 03/03/2021 No Yes exposed I73.89 Other specified peripheral vascular diseases 03/03/2021 No Yes I87.2 Venous insufficiency (chronic) (peripheral) 03/03/2021 No Yes I10 Essential (primary) hypertension 03/03/2021 No Yes E11.622 Type 2 diabetes mellitus with other skin ulcer 03/03/2021 No Yes Inactive Problems Resolved Problems Electronic Signature(s) Signed: 03/24/2021 3:34:59 PM By: Worthy Keeler PA-C Entered By: Worthy Keeler on 03/24/2021 15:34:59 Veronica Krueger ([id number]) -------------------------------------------------------------------------------- Progress Note Details Patient Name: Veronica Krueger Date of Service: 03/24/2021 3:00 PM Medical Record Number: [id number] Patient Account Number: [id number] Date of Birth/Sex: 08/11/1955 (66 y.o. F) Treating RN: Levora Dredge Primary Care Provider: Georgian Co Other Clinician: Referring Provider: Georgian Co Treating Provider/Extender: Skipper Cliche in Treatment: 3 Subjective Chief Complaint Information obtained from Patient Left LE Ulcer History of Present Illness (HPI) 03/03/2021 upon evaluation today patient appears to be doing somewhat poorly in  regard to the wound on her left lateral lower extremity. She tells me this has been there since around October 2022. It came up as a blister and she is never had anything like this before. With that being said she currently is concerned about the fact that her home health nurse with [facility] was telling her that this could potentially be infected. With that being said I am really not seeing evidence or signs of infection at this time. Right now they have been using Santyl and then along with the Santyl a wet-to-dry dressing. No compressions been utilized. She did have arterial studies when she was in the hospital in November showed that she had an ABI on the right of 0.78 on the left and 0.75 which is obviously not optimal. The patient does have peripheral vascular disease, diabetes mellitus type 2 though she tells me is diet controlled she takes no medicines and does not check her blood sugars. She also has a history of hypertension and chronic venous insufficiency along with lymphedema that I see today. 03/17/2021 upon evaluation today patient appears to be doing okay in regard to her wound though still appears to be very sloughy. She is also having some increased pain and according to the home health nurse, nurse here in the clinic, the patient this appears to have some odor as well. Again I am not able to smell this but again that is more of a personal issue with my olfactory sense since COVID. Either way I think that right now the concern is this possibly is an infection we do need to address that. I do not want her to get worse by any means. We are still waiting on her to get scheduled for the arterial study which I think needs to be done ASAP. 03/24/2020 upon evaluation today patient appears to be doing well with regard to her wound. She has been tolerating the dressing changes and with the new Levaquin antibiotic she is doing much better as well. Overall I am extremely pleased with where we stand  currently. Objective Constitutional Obese and well-hydrated in no acute distress. Vitals Time Taken: 3:15 PM, Height: 69 in, Temperature: 98.1 F, Pulse: 72 bpm, Respiratory Rate: 18 breaths/min, Blood Pressure: 175/86 mmHg. Respiratory normal breathing without difficulty. Psychiatric this patient is able to make decisions and demonstrates good  insight into disease process. Alert and Oriented x 3. pleasant and cooperative. General Notes: Upon inspection patient's wound bed actually showed signs of good granulation and epithelization at this point. Fortunately I do not see any signs of active infection locally nor systemically which is great news and overall I think we are definitely on the right course at this point. Integumentary (Hair, Skin) Wound #1 status is Open. Original cause of wound was Blister. The date acquired was: 12/04/2020. The wound has been in treatment 3 weeks. The wound is located on the Left,Lateral Lower Leg. The wound measures 5cm length x 6.9cm width x 0.2cm depth; 27.096cm^2 area and 5.419cm^3 volume. There is Fat Layer (Subcutaneous Tissue) exposed. There is no tunneling or undermining noted. There is a large amount of serosanguineous drainage noted. There is small (1-33%) red, hyper - granulation within the wound bed. There is a large (67-100%) amount of necrotic tissue within the wound bed including Adherent Slough. Veronica Krueger, Veronica Krueger ([id number]) Assessment Active Problems ICD-10 Lymphedema, not elsewhere classified Non-pressure chronic ulcer of other part of left lower leg with fat layer exposed Other specified peripheral vascular diseases Venous insufficiency (chronic) (peripheral) Essential (primary) hypertension Type 2 diabetes mellitus with other skin ulcer Plan Follow-up Appointments: Return Appointment in 1 week. Nurse Visit as needed Other: - please call vascular if you do not hear from them by tomorrow Home Health: [place]: -  [place] for wound care. May utilize formulary equivalent dressing for wound treatment orders unless otherwise specified. Home Health Nurse may visit [facility] to address patient s wound care needs. **Please direct any NON-WOUND related issues/requests for orders to patient's Primary Care Physician. **If current dressing causes regression in wound condition, may D/C ordered dressing product/s and apply Normal Saline Moist Dressing daily until next [place] or Other MD appointment. **Notify [facility] of regression in wound condition at [phone]. Other Home Health Orders/Instructions: - if using hydrafera blue with the covering to back with wording on it, please cut slits into back of hydrfera blue prior to apply to wound bed. Bathing/ Shower/ Hygiene: Wash wounds with antibacterial soap and water. No tub bath. Edema Control - Lymphedema / Segmental Compressive Device / Other: Elevate, Exercise Daily and Avoid Standing for Long Periods of Time. Elevate legs to the level of the heart and pump ankles as often as possible Elevate leg(s) parallel to the floor when sitting. DO YOUR BEST to sleep in the bed at night. DO NOT sleep in your recliner. Long hours of sitting in a recliner leads to swelling of the legs and/or potential wounds on your backside. WOUND #1: - Lower Leg Wound Laterality: Left, Lateral Primary Dressing: Hydrofera Blue Ready Transfer Foam, 2.5x2.5 (in/in) 3 x Per Week/30 Days Discharge Instructions: Apply Hydrofera Blue Ready to wound bed as directed Secondary Dressing: Zetuvit Plus Silicone Border Dressing 5x5 (in/in) 3 x Per Week/30 Days Secured With: Tubigrip Size G, 4.5x10 (in/yd) 3 x Per Week/30 Days Discharge Instructions: Apply Tubigrip G 3-finger-widths below knee to base of toes to secure dressing and/or for swelling. 1. Would recommend that we going to continue with the wound care measures as before and the patient is in  agreement with plan. This includes the use of the [facility] dressing which I think is doing a good job. 2. We will use the border foam dressing to cover. 3. Would continue with the Tubigrip for the time being. 4. She will also continue with the Levaquin which is  doing a great job and I think already starting to improve her wound appearance. We will see patient back for reevaluation in 1 week here in the clinic. If anything worsens or changes patient will contact our office for additional recommendations. Electronic Signature(s) Signed: 03/24/2021 5:13:51 PM By: Worthy Keeler PA-C Entered By: Worthy Keeler on 03/24/2021 17:13:51 Veronica Krueger ([id number]) -------------------------------------------------------------------------------- SuperBill Details Patient Name: Veronica Krueger Date of Service: 03/24/2021 Medical Record Number: [id number] Patient Account Number: [id number] Date of Birth/Sex: 08-11-1955 (66 y.o. F) Treating RN: Levora Dredge Primary Care Provider: Georgian Co Other Clinician: Referring Provider: Georgian Co Treating Provider/Extender: Skipper Cliche in Treatment: 3 Diagnosis Coding ICD-10 Codes Code Description I89.0 Lymphedema, not elsewhere classified L97.822 Non-pressure chronic ulcer of other part of left lower leg with fat layer exposed I73.89 Other specified peripheral vascular diseases I87.2 Venous insufficiency (chronic) (peripheral) I10 Essential (primary) hypertension E11.622 Type 2 diabetes mellitus with other skin ulcer Facility Procedures CPT4 Code: [id number] Description: [phone] - WOUND CARE VISIT-LEV 2 EST PT Modifier: Quantity: 1 Physician Procedures CPT4 Code: [id number] Description: 28206 - WC PHYS LEVEL 4 - EST PT Modifier: Quantity: 1 CPT4 Code: Description: ICD-10 Diagnosis Description I89.0 Lymphedema, not elsewhere classified L97.822 Non-pressure chronic ulcer of other part of left lower leg with fat lay I73.89  Other specified peripheral vascular diseases I87.2 Venous insufficiency (chronic)  (peripheral) Modifier: er exposed Quantity: Electronic Signature(s) Signed: 03/24/2021 5:14:38 PM By: Worthy Keeler PA-C Previous Signature: 03/24/2021 4:56:29 PM Version By: Levora Dredge Entered By: Worthy Keeler on 03/24/2021 17:14:37

## 2021-04-01 ENCOUNTER — Encounter (INDEPENDENT_AMBULATORY_CARE_PROVIDER_SITE_OTHER): Payer: Self-pay

## 2021-04-01 ENCOUNTER — Ambulatory Visit: Payer: Medicare HMO | Admitting: Physician Assistant

## 2021-04-02 ENCOUNTER — Encounter: Payer: Self-pay | Admitting: Internal Medicine

## 2021-04-07 ENCOUNTER — Other Ambulatory Visit: Payer: Self-pay

## 2021-04-07 ENCOUNTER — Encounter: Payer: Medicare HMO | Attending: Physician Assistant | Admitting: Physician Assistant

## 2021-04-07 DIAGNOSIS — I872 Venous insufficiency (chronic) (peripheral): Secondary | ICD-10-CM | POA: Insufficient documentation

## 2021-04-07 DIAGNOSIS — E1151 Type 2 diabetes mellitus with diabetic peripheral angiopathy without gangrene: Secondary | ICD-10-CM | POA: Diagnosis not present

## 2021-04-07 DIAGNOSIS — I890 Lymphedema, not elsewhere classified: Secondary | ICD-10-CM | POA: Diagnosis not present

## 2021-04-07 DIAGNOSIS — I10 Essential (primary) hypertension: Secondary | ICD-10-CM | POA: Diagnosis not present

## 2021-04-07 DIAGNOSIS — L97822 Non-pressure chronic ulcer of other part of left lower leg with fat layer exposed: Secondary | ICD-10-CM | POA: Diagnosis not present

## 2021-04-07 DIAGNOSIS — E11622 Type 2 diabetes mellitus with other skin ulcer: Secondary | ICD-10-CM | POA: Insufficient documentation

## 2021-04-07 NOTE — Progress Notes (Addendum)
UDELL, BLASINGAME ([id number]) Visit Report for 04/07/2021 Chief Complaint Document Details Patient Name: Veronica Krueger, Veronica Krueger Date of Service: 04/07/2021 2:45 PM Medical Record Number: [id number] Patient Account Number: [id number] Date of Birth/Sex: June 05, 1955 (66 y.o. F) Treating RN: Carlene Coria Primary Care Provider: Georgian Co Other Clinician: Referring Provider: Georgian Co Treating Provider/Extender: Skipper Cliche in Treatment: 5 Information Obtained from: Patient Chief Complaint Left LE Ulcer Electronic Signature(s) Signed: 04/07/2021 3:08:39 PM By: Worthy Keeler PA-C Entered By: Worthy Keeler on 04/07/2021 15:08:39 Veronica Krueger ([id number]) -------------------------------------------------------------------------------- HPI Details Patient Name: Veronica Krueger Date of Service: 04/07/2021 2:45 PM Medical Record Number: [id number] Patient Account Number: [id number] Date of Birth/Sex: 29-Jun-1955 (65 y.o. F) Treating RN: Carlene Coria Primary Care Provider: Georgian Co Other Clinician: Referring Provider: Georgian Co Treating Provider/Extender: Skipper Cliche in Treatment: 5 History of Present Illness HPI Description: 03/03/2021 upon evaluation today patient appears to be doing somewhat poorly in regard to the wound on her left lateral lower extremity. She tells me this has been there since around October 2022. It came up as a blister and she is never had anything like this before. With that being said she currently is concerned about the fact that her home health nurse with [facility] was telling her that this could potentially be infected. With that being said I am really not seeing evidence or signs of infection at this time. Right now they have been using Santyl and then along with the Santyl a wet-to-dry dressing. No compressions been utilized. She did have arterial studies when she was in the hospital in November showed that she had an ABI on the  right of 0.78 on the left and 0.75 which is obviously not optimal. The patient does have peripheral vascular disease, diabetes mellitus type 2 though she tells me is diet controlled she takes no medicines and does not check her blood sugars. She also has a history of hypertension and chronic venous insufficiency along with lymphedema that I see today. 03/17/2021 upon evaluation today patient appears to be doing okay in regard to her wound though still appears to be very sloughy. She is also having some increased pain and according to the home health nurse, nurse here in the clinic, the patient this appears to have some odor as well. Again I am not able to smell this but again that is more of a personal issue with my olfactory sense since COVID. Either way I think that right now the concern is this possibly is an infection we do need to address that. I do not want her to get worse by any means. We are still waiting on her to get scheduled for the arterial study which I think needs to be done ASAP. 03/24/2020 upon evaluation today patient appears to be doing well with regard to her wound. She has been tolerating the dressing changes and with the new Levaquin antibiotic she is doing much better as well. Overall I am extremely pleased with where we stand currently. 04/07/2021 upon evaluation patient's wound bed actually showed signs of some slight improvements. I am actually pleased in that regard. Fortunately I do not see any evidence of active infection locally or systemically which is great news and overall I think we are headed in the appropriate direction. The patient is pleased to hear this she does have an appointment with the vascular doctor on Wednesday to see what needs to be done to improve her arterial flow. Electronic Signature(s) Signed: 04/07/2021 4:47:00 PM By: Melburn Hake,  Margarita Grizzle PA-C Entered By: Worthy Keeler on 04/07/2021 16:47:00 Veronica Krueger  ([id number]) -------------------------------------------------------------------------------- Physical Exam Details Patient Name: Veronica Krueger, Veronica Krueger Date of Service: 04/07/2021 2:45 PM Medical Record Number: [id number] Patient Account Number: [id number] Date of Birth/Sex: 1955-11-24 (66 y.o. F) Treating RN: Carlene Coria Primary Care Provider: Georgian Co Other Clinician: Referring Provider: Georgian Co Treating Provider/Extender: Skipper Cliche in Treatment: 5 Constitutional Well-nourished and well-hydrated in no acute distress. Respiratory normal breathing without difficulty. Psychiatric this patient is able to make decisions and demonstrates good insight into disease process. Alert and Oriented x 3. pleasant and cooperative. Notes Patient's wound showed signs of good granulation and epithelization at this point. Fortunately I do not see any evidence of active infection locally or systemically at this point. Electronic Signature(s) Signed: 04/07/2021 5:04:41 PM By: Worthy Keeler PA-C Entered By: Worthy Keeler on 04/07/2021 17:04:41 Veronica Krueger ([id number]) -------------------------------------------------------------------------------- Physician Orders Details Patient Name: Veronica Krueger Date of Service: 04/07/2021 2:45 PM Medical Record Number: [id number] Patient Account Number: [id number] Date of Birth/Sex: [birth year] (65 y.o. F) Treating RN: Carlene Coria Primary Care Provider: Georgian Co Other Clinician: Referring Provider: Georgian Co Treating Provider/Extender: Skipper Cliche in Treatment: 5 Verbal / Phone Orders: No Diagnosis Coding ICD-10 Coding Code Description I89.0 Lymphedema, not elsewhere classified L97.822 Non-pressure chronic ulcer of other part of left lower leg with fat layer exposed I73.89 Other specified peripheral vascular diseases I87.2 Venous insufficiency (chronic) (peripheral) I10 Essential (primary) hypertension E11.622  Type 2 diabetes mellitus with other skin ulcer Follow-up Appointments o Return Appointment in 1 week. o Nurse Visit as needed o Other: - please call vascular if you do not hear from them by tomorrow [place] for wound care. May utilize formulary equivalent dressing for wound treatment orders unless otherwise specified. Home Health Nurse may visit [facility] to address patientos wound care needs. o **Please direct any NON-WOUND related issues/requests for orders to patient's Primary Care Physician. **If current dressing causes regression in wound condition, may D/C ordered dressing product/s and apply Normal Saline Moist Dressing daily until next [place] or Other MD appointment. **Notify [facility] of regression in wound condition at [phone]. o Other Home Health Orders/Instructions: - if using hydrafera blue with the covering to back with wording on it, please cut slits into back of hydrfera blue prior to apply to wound bed. Bathing/ Shower/ Hygiene o Wash wounds with antibacterial soap and water. o No tub bath. Edema Control - Lymphedema / Segmental Compressive Device / Other Bilateral Lower Extremities o Elevate, Exercise Daily and Avoid Standing for Long Periods of Time. o Elevate legs to the level of the heart and pump ankles as often as possible o Elevate leg(s) parallel to the floor when sitting. o DO YOUR BEST to sleep in the bed at night. DO NOT sleep in your recliner. Long hours of sitting in a recliner leads to swelling of the legs and/or potential wounds on your backside. Wound Treatment Wound #1 - Lower Leg Wound Laterality: Left, Lateral Primary Dressing: Hydrofera Blue Ready Transfer Foam, 2.5x2.5 (in/in) 3 x Per Week/30 Days Discharge Instructions: Apply Hydrofera Blue Ready to wound bed as directed Secondary Dressing: Zetuvit Plus Silicone Border Dressing 5x5 (in/in)  3 x Per Week/30 Days Secured With: Tubigrip Size G, 4.5x10 (in/yd) 3 x Per Week/30 Days Discharge Instructions: Apply Tubigrip G 3-finger-widths below knee to base of toes to secure dressing and/or for swelling. Electronic Signature(s) Signed:  04/07/2021 3:27:38 PM By: Worthy Keeler PA-C Signed: 04/10/2021 9:01:13 AM By: Carlene Coria RN Entered By: Carlene Coria on 04/07/2021 15:24:01 Veronica Krueger ([id number]) Veronica Krueger, Veronica Krueger ([id number]) -------------------------------------------------------------------------------- Problem List Details Patient Name: Veronica Krueger Date of Service: 04/07/2021 2:45 PM Medical Record Number: [id number] Patient Account Number: [id number] Date of Birth/Sex: 01-06-1956 (65 y.o. F) Treating RN: Carlene Coria Primary Care Provider: Georgian Co Other Clinician: Referring Provider: Georgian Co Treating Provider/Extender: Skipper Cliche in Treatment: 5 Active Problems ICD-10 Encounter Code Description Active Date MDM Diagnosis I89.0 Lymphedema, not elsewhere classified 03/03/2021 No Yes L97.822 Non-pressure chronic ulcer of other part of left lower leg with fat layer 03/03/2021 No Yes exposed I73.89 Other specified peripheral vascular diseases 03/03/2021 No Yes I87.2 Venous insufficiency (chronic) (peripheral) 03/03/2021 No Yes I10 Essential (primary) hypertension 03/03/2021 No Yes E11.622 Type 2 diabetes mellitus with other skin ulcer 03/03/2021 No Yes Inactive Problems Resolved Problems Electronic Signature(s) Signed: 04/07/2021 3:08:28 PM By: Worthy Keeler PA-C Entered By: Worthy Keeler on 04/07/2021 15:08:27 Veronica Krueger ([id number]) -------------------------------------------------------------------------------- Progress Note Details Patient Name: Veronica Krueger Date of Service: 04/07/2021 2:45 PM Medical Record Number: [id number] Patient Account Number: [id number] Date of Birth/Sex: [birth year] (65 y.o. F) Treating RN: Carlene Coria Primary Care Provider: Georgian Co Other Clinician: Referring Provider: Georgian Co Treating Provider/Extender: Skipper Cliche in Treatment: 5 Subjective Chief Complaint Information obtained from Patient Left LE Ulcer History of Present Illness (HPI) 03/03/2021 upon evaluation today patient appears to be doing somewhat poorly in regard to the wound on her left lateral lower extremity. She tells me this has been there since around October 2022. It came up as a blister and she is never had anything like this before. With that being said she currently is concerned about the fact that her home health nurse with [facility] was telling her that this could potentially be infected. With that being said I am really not seeing evidence or signs of infection at this time. Right now they have been using Santyl and then along with the Santyl a wet-to-dry dressing. No compressions been utilized. She did have arterial studies when she was in the hospital in November showed that she had an ABI on the right of 0.78 on the left and 0.75 which is obviously not optimal. The patient does have peripheral vascular disease, diabetes mellitus type 2 though she tells me is diet controlled she takes no medicines and does not check her blood sugars. She also has a history of hypertension and chronic venous insufficiency along with lymphedema that I see today. 03/17/2021 upon evaluation today patient appears to be doing okay in regard to her wound though still appears to be very sloughy. She is also having some increased pain and according to the home health nurse, nurse here in the clinic, the patient this appears to have some odor as well. Again I am not able to smell this but again that is more of a personal issue with my olfactory sense since COVID. Either way I think that right now the concern is this possibly is an infection we do need to address that. I do not want her to get worse by any means. We are  still waiting on her to get scheduled for the arterial study which I think needs to be done ASAP. 03/24/2020 upon evaluation today patient appears to be doing well with regard to her wound. She has been tolerating the dressing changes and with the new Levaquin antibiotic she is doing much  better as well. Overall I am extremely pleased with where we stand currently. 04/07/2021 upon evaluation patient's wound bed actually showed signs of some slight improvements. I am actually pleased in that regard. Fortunately I do not see any evidence of active infection locally or systemically which is great news and overall I think we are headed in the appropriate direction. The patient is pleased to hear this she does have an appointment with the vascular doctor on Wednesday to see what needs to be done to improve her arterial flow. Objective Constitutional Well-nourished and well-hydrated in no acute distress. Vitals Time Taken: 3:06 PM, Height: 69 in, Temperature: 98 F, Pulse: 68 bpm, Respiratory Rate: 20 breaths/min, Blood Pressure: 152/59 mmHg. Respiratory normal breathing without difficulty. Psychiatric this patient is able to make decisions and demonstrates good insight into disease process. Alert and Oriented x 3. pleasant and cooperative. General Notes: Patient's wound showed signs of good granulation and epithelization at this point. Fortunately I do not see any evidence of active infection locally or systemically at this point. Integumentary (Hair, Skin) Wound #1 status is Open. Original cause of wound was Blister. The date acquired was: 12/04/2020. The wound has been in treatment 5 weeks. The wound is located on the Left,Lateral Lower Leg. The wound measures 5.2cm length x 6.4cm width x 0.2cm depth; 26.138cm^2 area and 5.228cm^3 volume. There is Fat Layer (Subcutaneous Tissue) exposed. There is no tunneling or undermining noted. There is a medium amount of serosanguineous drainage noted. There  is small (1-33%) red, hyper - granulation within the wound bed. There is a large (67-100%) amount of necrotic tissue within the wound bed including Adherent Slough. Veronica Krueger, Veronica Krueger ([id number]) Assessment Active Problems ICD-10 Lymphedema, not elsewhere classified Non-pressure chronic ulcer of other part of left lower leg with fat layer exposed Other specified peripheral vascular diseases Venous insufficiency (chronic) (peripheral) Essential (primary) hypertension Type 2 diabetes mellitus with other skin ulcer Plan Follow-up Appointments: Return Appointment in 1 week. Nurse Visit as needed Other: - please call vascular if you do not hear from them by tomorrow Home Health: [place]: - [place] for wound care. May utilize formulary equivalent dressing for wound treatment orders unless otherwise specified. Home Health Nurse may visit [facility] to address patient s wound care needs. **Please direct any NON-WOUND related issues/requests for orders to patient's Primary Care Physician. **If current dressing causes regression in wound condition, may D/C ordered dressing product/s and apply Normal Saline Moist Dressing daily until next [place] or Other MD appointment. **Notify [facility] of regression in wound condition at [phone]. Other Home Health Orders/Instructions: - if using hydrafera blue with the covering to back with wording on it, please cut slits into back of hydrfera blue prior to apply to wound bed. Bathing/ Shower/ Hygiene: Wash wounds with antibacterial soap and water. No tub bath. Edema Control - Lymphedema / Segmental Compressive Device / Other: Elevate, Exercise Daily and Avoid Standing for Long Periods of Time. Elevate legs to the level of the heart and pump ankles as often as possible Elevate leg(s) parallel to the floor when sitting. DO YOUR BEST to sleep in the bed at night. DO NOT sleep in your recliner. Long hours  of sitting in a recliner leads to swelling of the legs and/or potential wounds on your backside. WOUND #1: - Lower Leg Wound Laterality: Left, Lateral Primary Dressing: Hydrofera Blue Ready Transfer Foam, 2.5x2.5 (in/in) 3 x Per Week/30 Days Discharge Instructions: Apply Hydrofera Blue Ready  to wound bed as directed Secondary Dressing: Zetuvit Plus Silicone Border Dressing 5x5 (in/in) 3 x Per Week/30 Days Secured With: Tubigrip Size G, 4.5x10 (in/yd) 3 x Per Week/30 Days Discharge Instructions: Apply Tubigrip G 3-finger-widths below knee to base of toes to secure dressing and/or for swelling. 1. Would recommend currently that we going to continue with the wound care measures as before and the patient is in agreement with the plan. Fortunately I do not see any signs of active infection locally nor systemically at this point. As such we will get a continue with the [facility]. 2. With regard to the overall wound status there is some necrotic tissue for certain that does need to be worked on but again right now the main issue is we need to get her blood flow under control and that is something she is good to be seeing the vascular doctor for on the 15th. We will see patient back for reevaluation in 1 week here in the clinic. If anything worsens or changes patient will contact our office for additional recommendations. Electronic Signature(s) Signed: 04/07/2021 5:06:10 PM By: Worthy Keeler PA-C Entered By: Worthy Keeler on 04/07/2021 17:06:09 Veronica Krueger ([id number]) -------------------------------------------------------------------------------- SuperBill Details Patient Name: Veronica Krueger Date of Service: 04/07/2021 Medical Record Number: [id number] Patient Account Number: [id number] Date of Birth/Sex: 05/28/1955 (65 y.o. F) Treating RN: Carlene Coria Primary Care Provider: Georgian Co Other Clinician: Referring Provider: Georgian Co Treating Provider/Extender: Skipper Cliche in Treatment: 5 Diagnosis Coding ICD-10 Codes Code Description I89.0 Lymphedema, not elsewhere classified L97.822 Non-pressure chronic ulcer of other part of left lower leg with fat layer exposed I73.89 Other specified peripheral vascular diseases I87.2 Venous insufficiency (chronic) (peripheral) I10 Essential (primary) hypertension E11.622 Type 2 diabetes mellitus with other skin ulcer Facility Procedures CPT4 Code: [id number] Description: 99213 - WOUND CARE VISIT-LEV 3 EST PT Modifier: Quantity: 1 Physician Procedures CPT4 Code: [id number] Description: 33825 - WC PHYS LEVEL 4 - EST PT Modifier: Quantity: 1 CPT4 Code: Description: ICD-10 Diagnosis Description I89.0 Lymphedema, not elsewhere classified L97.822 Non-pressure chronic ulcer of other part of left lower leg with fat lay I73.89 Other specified peripheral vascular diseases I87.2 Venous insufficiency (chronic)  (peripheral) Modifier: er exposed Quantity: Electronic Signature(s) Signed: 04/07/2021 5:06:31 PM By: Worthy Keeler PA-C Previous Signature: 04/07/2021 4:02:55 PM Version By: Carlene Coria RN Entered By: Worthy Keeler on 04/07/2021 17:06:30

## 2021-04-07 NOTE — Progress Notes (Addendum)
Veronica Krueger, Veronica Krueger ([id number]) Visit Report for 04/07/2021 Arrival Information Details Patient Name: Veronica Krueger, Veronica Krueger Date of Service: 04/07/2021 2:45 PM Medical Record Number: [id number] Patient Account Number: [id number] Date of Birth/Sex: 09/09/1955 (66 y.o. F) Treating RN: Carlene Coria Primary Care Delayza Lungren: [place] [employer] Other Clinician: Referring Josepha Barbier: [place] [employer] Treating Delynn Pursley/Extender: Skipper Cliche in Treatment: 5 Visit Information History Since Last Visit All ordered tests and consults were completed: No Patient Arrived: Wheel Chair Added or deleted any medications: No Arrival Time: 14:53 Any new allergies or adverse reactions: No Accompanied By: self Had a fall or experienced change in No Transfer Assistance: None activities of daily living that may affect Patient Identification Verified: Yes risk of falls: Secondary Verification Process Completed: Yes Signs or symptoms of abuse/neglect since last visito No Patient Requires Transmission-Based No Hospitalized since last visit: No Precautions: Implantable device outside of the clinic excluding No Patient Has Alerts: Yes cellular tissue based products placed in the center Patient Alerts: diabetic type 2 since last visit: ABI 01/06/21 L 0.75 R Has Dressing in Place as Prescribed: Yes 0.7 Pain Present Now: No Electronic Signature(s) Signed: 04/07/2021 3:16:27 PM By: Carlene Coria RN Entered By: Carlene Coria on 04/07/2021 15:06:24 Veronica Krueger ([id number]) -------------------------------------------------------------------------------- Clinic Level of Care Assessment Details Patient Name: Veronica Krueger Date of Service: 04/07/2021 2:45 PM Medical Record Number: [id number] Patient Account Number: [id number] Date of Birth/Sex: November 23, 1955 (66 y.o. F) Treating RN: Carlene Coria Primary Care Ashantee Deupree: [place] [employer] Other Clinician: Referring Zylan Almquist: [place] [employer] Treating Kiyoshi Schaab/Extender:  Skipper Cliche in Treatment: 5 Clinic Level of Care Assessment Items TOOL 4 Quantity Score X - Use when only an EandM is performed on FOLLOW-UP visit 1 0 ASSESSMENTS - Nursing Assessment / Reassessment X - Reassessment of Co-morbidities (includes updates in patient status) 1 10 X- 1 5 Reassessment of Adherence to Treatment Plan ASSESSMENTS - Wound and Skin Assessment / Reassessment X - Simple Wound Assessment / Reassessment - one wound 1 5 '[]'  - 0 Complex Wound Assessment / Reassessment - multiple wounds '[]'  - 0 Dermatologic / Skin Assessment (not related to wound area) ASSESSMENTS - Focused Assessment '[]'  - Circumferential Edema Measurements - multi extremities 0 '[]'  - 0 Nutritional Assessment / Counseling / Intervention '[]'  - 0 Lower Extremity Assessment (monofilament, tuning fork, pulses) '[]'  - 0 Peripheral Arterial Disease Assessment (using hand held doppler) ASSESSMENTS - Ostomy and/or Continence Assessment and Care '[]'  - Incontinence Assessment and Management 0 '[]'  - 0 Ostomy Care Assessment and Management (repouching, etc.) PROCESS - Coordination of Care X - Simple Patient / Family Education for ongoing care 1 15 '[]'  - 0 Complex (extensive) Patient / Family Education for ongoing care '[]'  - 0 Staff obtains Programmer, systems, Records, Test Results / Process Orders '[]'  - 0 Staff telephones HHA, Nursing Homes / Clarify orders / etc '[]'  - 0 Routine Transfer to another Facility (non-emergent condition) '[]'  - 0 Routine Hospital Admission (non-emergent condition) '[]'  - 0 New Admissions / Biomedical engineer / Ordering NPWT, Apligraf, etc. '[]'  - 0 Emergency Hospital Admission (emergent condition) X- 1 10 Simple Discharge Coordination '[]'  - 0 Complex (extensive) Discharge Coordination PROCESS - Special Needs '[]'  - Pediatric / Minor Patient Management 0 '[]'  - 0 Isolation Patient Management '[]'  - 0 Hearing / Language / Visual special needs '[]'  - 0 Assessment of Community assistance  (transportation, D/C planning, etc.) '[]'  - 0 Additional assistance / Altered mentation '[]'  - 0 Support Surface(s) Assessment (bed, cushion, seat, etc.) INTERVENTIONS - Wound Cleansing / Measurement Veronica Krueger, Veronica Krueger ([id number])  X- 1 5 Simple Wound Cleansing - one wound '[]'  - 0 Complex Wound Cleansing - multiple wounds X- 1 5 Wound Imaging (photographs - any number of wounds) '[]'  - 0 Wound Tracing (instead of photographs) X- 1 5 Simple Wound Measurement - one wound '[]'  - 0 Complex Wound Measurement - multiple wounds INTERVENTIONS - Wound Dressings '[]'  - Small Wound Dressing one or multiple wounds 0 X- 1 15 Medium Wound Dressing one or multiple wounds '[]'  - 0 Large Wound Dressing one or multiple wounds '[]'  - 0 Application of Medications - topical '[]'  - 0 Application of Medications - injection INTERVENTIONS - Miscellaneous '[]'  - External ear exam 0 '[]'  - 0 Specimen Collection (cultures, biopsies, blood, body fluids, etc.) '[]'  - 0 Specimen(s) / Culture(s) sent or taken to Lab for analysis '[]'  - 0 Patient Transfer (multiple staff / Civil Service fast streamer / Similar devices) '[]'  - 0 Simple Staple / Suture removal (25 or less) '[]'  - 0 Complex Staple / Suture removal (26 or more) '[]'  - 0 Hypo / Hyperglycemic Management (close monitor of Blood Glucose) '[]'  - 0 Ankle / Brachial Index (ABI) - do not check if billed separately X- 1 5 Vital Signs Has the patient been seen at the hospital within the last three years: Yes Total Score: 80 Level Of Care: New/Established - Level 3 Electronic Signature(s) Signed: 04/10/2021 9:01:13 AM By: Carlene Coria RN Entered By: Carlene Coria on 04/07/2021 16:02:47 Veronica Krueger ([id number]) -------------------------------------------------------------------------------- Encounter Discharge Information Details Patient Name: Veronica Krueger Date of Service: 04/07/2021 2:45 PM Medical Record Number: [id number] Patient Account Number: [id number] Date of Birth/Sex:  September 16, 1955 (65 y.o. F) Treating RN: Carlene Coria Primary Care Jerusalem Brownstein: [place] [employer] Other Clinician: Referring Kennidi Yoshida: [place] [employer] Treating Arlys Scatena/Extender: Skipper Cliche in Treatment: 5 Encounter Discharge Information Items Discharge Condition: Stable Ambulatory Status: Wheelchair Discharge Destination: Home Transportation: Private Auto Accompanied By: self Schedule Follow-up Appointment: Yes Clinical Summary of Care: Patient Declined Electronic Signature(s) Signed: 04/07/2021 4:03:50 PM By: Carlene Coria RN Entered By: Carlene Coria on 04/07/2021 16:03:50 Veronica Krueger ([id number]) -------------------------------------------------------------------------------- Lower Extremity Assessment Details Patient Name: Veronica Krueger Date of Service: 04/07/2021 2:45 PM Medical Record Number: [id number] Patient Account Number: [id number] Date of Birth/Sex: Dec 14, 1955 (65 y.o. F) Treating RN: Carlene Coria Primary Care Milderd Manocchio: [place] [employer] Other Clinician: Referring Ourania Hamler: [place] [employer] Treating Kristeen Lantz/Extender: Jeri Cos Weeks in Treatment: 5 Edema Assessment Assessed: [Left: No] [Right: No] Edema: [Left: Ye] [Right: s] Calf Left: Right: Point of Measurement: 32 cm From Medial Instep 60 cm Ankle Left: Right: Point of Measurement: 9 cm From Medial Instep 42 cm Vascular Assessment Pulses: Dorsalis Pedis Palpable: [Left:Yes] Electronic Signature(s) Signed: 04/07/2021 3:16:27 PM By: Carlene Coria RN Entered By: Carlene Coria on 04/07/2021 15:13:43 Veronica Krueger ([id number]) -------------------------------------------------------------------------------- Multi Wound Chart Details Patient Name: Veronica Krueger Date of Service: 04/07/2021 2:45 PM Medical Record Number: [id number] Patient Account Number: [id number] Date of Birth/Sex: Mar 05, 1955 (65 y.o. F) Treating RN: Carlene Coria Primary Care Gabbrielle Mcnicholas: [place] [employer] Other Clinician: Referring  Royce Stegman: [place] [employer] Treating Jahaan Vanwagner/Extender: Skipper Cliche in Treatment: 5 Vital Signs Height(in): 50 Pulse(bpm): 31 Weight(lbs): Blood Pressure(mmHg): 152/59 Body Mass Index(BMI): Temperature(F): 98 Respiratory Rate(breaths/min): 20 Photos: [N/A:N/A] Wound Location: Left, Lateral Lower Leg N/A N/A Wounding Event: Blister N/A N/A Primary Etiology: Diabetic Wound/Ulcer of the Lower N/A N/A Extremity Comorbid History: Glaucoma, Anemia, Lymphedema, N/A N/A Hypertension, Type II Diabetes, Gout Date Acquired: 12/04/2020 N/A N/A Weeks of Treatment: 5 N/A N/A Wound Status: Open N/A N/A Wound Recurrence: No N/A N/A  Measurements L x W x D (cm) 5.2x6.4x0.2 N/A N/A Area (cm) : 26.138 N/A N/A Volume (cm) : 5.228 N/A N/A % Reduction in Area: -22.00% N/A N/A % Reduction in Volume: -22.00% N/A N/A Classification: Grade 1 N/A N/A Exudate Amount: Medium N/A N/A Exudate Type: Serosanguineous N/A N/A Exudate Color: red, brown N/A N/A Granulation Amount: Small (1-33%) N/A N/A Granulation Quality: Red, Hyper-granulation N/A N/A Necrotic Amount: Large (67-100%) N/A N/A Exposed Structures: Fat Layer (Subcutaneous Tissue): N/A N/A Yes Epithelialization: Small (1-33%) N/A N/A Treatment Notes Electronic Signature(s) Signed: 04/10/2021 9:01:13 AM By: Carlene Coria RN Entered By: Carlene Coria on 04/07/2021 15:23:25 Veronica Krueger ([id number]) -------------------------------------------------------------------------------- Multi-Disciplinary Care Plan Details Patient Name: Veronica Krueger Date of Service: 04/07/2021 2:45 PM Medical Record Number: [id number] Patient Account Number: [id number] Date of Birth/Sex: 01/11/1956 (65 y.o. F) Treating RN: Carlene Coria Primary Care Zunairah Devers: [place] [employer] Other Clinician: Referring Asar Evilsizer: [place] [employer] Treating Lessie Manigo/Extender: Skipper Cliche in Treatment: 5 Active Inactive Wound/Skin Impairment Nursing  Diagnoses: Impaired tissue integrity Knowledge deficit related to ulceration/compromised skin integrity Goals: Patient will have a decrease in wound volume by X% from date: (specify in notes) Date Initiated: 03/03/2021 Date Inactivated: 04/07/2021 Target Resolution Date: 03/31/2021 Goal Status: Met Patient/caregiver will verbalize understanding of skin care regimen Date Initiated: 03/03/2021 Target Resolution Date: 04/28/2021 Goal Status: Active Ulcer/skin breakdown will have a volume reduction of 30% by week 4 Date Initiated: 03/03/2021 Date Inactivated: 04/07/2021 Target Resolution Date: 04/28/2021 Goal Status: Unmet Unmet Reason: comorbities Ulcer/skin breakdown will have a volume reduction of 50% by week 8 Date Initiated: 03/03/2021 Target Resolution Date: 05/26/2021 Goal Status: Active Ulcer/skin breakdown will have a volume reduction of 80% by week 12 Date Initiated: 03/03/2021 Target Resolution Date: 06/23/2021 Goal Status: Active Ulcer/skin breakdown will heal within 14 weeks Date Initiated: 03/03/2021 Target Resolution Date: 07/07/2021 Goal Status: Active Interventions: Assess patient/caregiver ability to obtain necessary supplies Assess patient/caregiver ability to perform ulcer/skin care regimen upon admission and as needed Assess ulceration(s) every visit Provide education on ulcer and skin care Notes: Electronic Signature(s) Signed: 04/10/2021 9:01:13 AM By: Carlene Coria RN Entered By: Carlene Coria on 04/07/2021 15:22:55 Veronica Krueger ([id number]) -------------------------------------------------------------------------------- Pain Assessment Details Patient Name: Veronica Krueger Date of Service: 04/07/2021 2:45 PM Medical Record Number: [id number] Patient Account Number: [id number] Date of Birth/Sex: 1955-06-07 (66 y.o. F) Treating RN: Carlene Coria Primary Care Yoshi Mancillas: [place] [employer] Other Clinician: Referring Lee Kuang: [place] [employer] Treating Laelani Vasko/Extender: Skipper Cliche in Treatment: 5 Active Problems Location of Pain Severity and Description of Pain Patient Has Paino No Site Locations Pain Management and Medication Current Pain Management: Electronic Signature(s) Signed: 04/07/2021 3:16:27 PM By: Carlene Coria RN Entered By: Carlene Coria on 04/07/2021 15:07:18 Veronica Krueger ([id number]) -------------------------------------------------------------------------------- Patient/Caregiver Education Details Patient Name: Veronica Krueger Date of Service: 04/07/2021 2:45 PM Medical Record Number: [id number] Patient Account Number: [id number] Date of Birth/Gender: Mar 19, 1955 (65 y.o. F) Treating RN: Carlene Coria Primary Care Physician: [place] Co Other Clinician: Referring Physician: [place] Co Treating Physician/Extender: Skipper Cliche in Treatment: 5 Education Assessment Education Provided To: Patient Education Topics Provided Wound/Skin Impairment: Methods: Explain/Verbal Responses: State content correctly Electronic Signature(s) Signed: 04/10/2021 9:01:13 AM By: Carlene Coria RN Entered By: Carlene Coria on 04/07/2021 16:03:04 Veronica Krueger ([id number]) -------------------------------------------------------------------------------- Wound Assessment Details Patient Name: Veronica Krueger Date of Service: 04/07/2021 2:45 PM Medical Record Number: [id number] Patient Account Number: [id number] Date of Birth/Sex: 1955-09-11 (65 y.o. F) Treating RN: Carlene Coria Primary Care Indy Kuck: [place] [employer] Other Clinician: Referring Ean Gettel: [place] [employer] Treating  Chantry Headen/Extender: Jeri Cos Weeks in Treatment: 5 Wound Status Wound Number: 1 Primary Diabetic Wound/Ulcer of the Lower Extremity Etiology: Wound Location: Left, Lateral Lower Leg Wound Status: Open Wounding Event: Blister Comorbid Glaucoma, Anemia, Lymphedema, Hypertension, Type Date Acquired: 12/04/2020 History: II Diabetes, Gout Weeks Of Treatment:  5 Clustered Wound: No Photos Wound Measurements Length: (cm) 5.2 Width: (cm) 6.4 Depth: (cm) 0.2 Area: (cm) 26.138 Volume: (cm) 5.228 % Reduction in Area: -22% % Reduction in Volume: -22% Epithelialization: Small (1-33%) Tunneling: No Undermining: No Wound Description Classification: Grade 1 Exudate Amount: Medium Exudate Type: Serosanguineous Exudate Color: red, brown Foul Odor After Cleansing: No Slough/Fibrino Yes Wound Bed Granulation Amount: Small (1-33%) Exposed Structure Granulation Quality: Red, Hyper-granulation Fat Layer (Subcutaneous Tissue) Exposed: Yes Necrotic Amount: Large (67-100%) Necrotic Quality: Adherent Slough Treatment Notes Wound #1 (Lower Leg) Wound Laterality: Left, Lateral Cleanser Peri-Wound Care Topical Primary Dressing Hydrofera Blue Ready Transfer Foam, 2.5x2.5 (in/in) Discharge Instruction: Apply Hydrofera Blue Ready to wound bed as directed Veronica Krueger, Veronica Krueger ([id number]) Secondary Dressing Zetuvit Plus Silicone Border Dressing 5x5 (in/in) Secured With Tubigrip Size G, 4.5x10 (in/yd) Discharge Instruction: Apply Tubigrip G 3-finger-widths below knee to base of toes to secure dressing and/or for swelling. Compression Wrap Compression Stockings Add-Ons Electronic Signature(s) Signed: 04/07/2021 3:16:27 PM By: Carlene Coria RN Entered By: Carlene Coria on 04/07/2021 15:12:59 Veronica Krueger ([id number]) -------------------------------------------------------------------------------- Vitals Details Patient Name: Veronica Krueger Date of Service: 04/07/2021 2:45 PM Medical Record Number: [id number] Patient Account Number: [id number] Date of Birth/Sex: 30-May-1955 (65 y.o. F) Treating RN: Carlene Coria Primary Care Yossef Gilkison: [place] [employer] Other Clinician: Referring Marlaine Arey: [place] [employer] Treating Rolla Servidio/Extender: Skipper Cliche in Treatment: 5 Vital Signs Time Taken: 15:06 Temperature (F): 98 Height (in): 69 Pulse (bpm):  68 Respiratory Rate (breaths/min): 20 Blood Pressure (mmHg): 152/59 Reference Range: 80 - 120 mg / dl Electronic Signature(s) Signed: 04/07/2021 3:16:27 PM By: Carlene Coria RN Entered By: Carlene Coria on 04/07/2021 15:07:12

## 2021-04-09 ENCOUNTER — Other Ambulatory Visit: Payer: Self-pay

## 2021-04-09 ENCOUNTER — Ambulatory Visit (INDEPENDENT_AMBULATORY_CARE_PROVIDER_SITE_OTHER): Payer: Medicare HMO

## 2021-04-09 DIAGNOSIS — L97822 Non-pressure chronic ulcer of other part of left lower leg with fat layer exposed: Secondary | ICD-10-CM

## 2021-04-14 ENCOUNTER — Other Ambulatory Visit: Payer: Self-pay

## 2021-04-14 ENCOUNTER — Encounter: Payer: Medicare HMO | Admitting: Physician Assistant

## 2021-04-14 DIAGNOSIS — I890 Lymphedema, not elsewhere classified: Secondary | ICD-10-CM | POA: Diagnosis not present

## 2021-04-14 DIAGNOSIS — E11622 Type 2 diabetes mellitus with other skin ulcer: Secondary | ICD-10-CM | POA: Diagnosis not present

## 2021-04-14 DIAGNOSIS — L97822 Non-pressure chronic ulcer of other part of left lower leg with fat layer exposed: Secondary | ICD-10-CM | POA: Diagnosis not present

## 2021-04-14 DIAGNOSIS — I10 Essential (primary) hypertension: Secondary | ICD-10-CM | POA: Diagnosis not present

## 2021-04-14 DIAGNOSIS — I872 Venous insufficiency (chronic) (peripheral): Secondary | ICD-10-CM | POA: Diagnosis not present

## 2021-04-14 DIAGNOSIS — E1151 Type 2 diabetes mellitus with diabetic peripheral angiopathy without gangrene: Secondary | ICD-10-CM | POA: Diagnosis not present

## 2021-04-14 NOTE — Progress Notes (Addendum)
Veronica Krueger, Veronica Krueger ([id number]) Visit Report for 04/14/2021 Arrival Information Details Patient Name: Veronica Krueger, Veronica Krueger Date of Service: 04/14/2021 3:00 PM Medical Record Number: [id number] Patient Account Number: [id number] Date of Birth/Sex: February 09, 1956 (66 y.o. F) Treating RN: Levora Dredge Primary Care Ambrose Wile: [place]ian Co Other Clinician: Referring Cherokee Boccio: [place]ian Co Treating Waylon Hershey/Extender: Skipper Cliche in Treatment: 6 Visit Information History Since Last Visit Added or deleted any medications: No Patient Arrived: Wheel Chair Any new allergies or adverse reactions: No Arrival Time: 15:07 Had a fall or experienced change in No Accompanied By: self activities of daily living that may affect Transfer Assistance: EasyPivot Patient Lift risk of falls: Patient Identification Verified: Yes Hospitalized since last visit: No Secondary Verification Process Completed: Yes Has Dressing in Place as Prescribed: Yes Patient Requires Transmission-Based No Pain Present Now: No Precautions: Patient Has Alerts: Yes Patient Alerts: diabetic type 2 04/10/31 ABI R 0.92 L 1.04 Electronic Signature(s) Signed: 04/14/2021 4:31:00 PM By: Levora Dredge Entered By: Levora Dredge on 04/14/2021 15:42:59 Veronica Krueger ([id number]) -------------------------------------------------------------------------------- Clinic Level of Care Assessment Details Patient Name: Veronica Krueger Date of Service: 04/14/2021 3:00 PM Medical Record Number: [id number] Patient Account Number: [id number] Date of Birth/Sex: 06-25-1955 (66 y.o. F) Treating RN: Levora Dredge Primary Care Stevan Eberwein: [place] Co Other Clinician: Referring Melvie Paglia: [place] Co Treating Enslie Sahota/Extender: Skipper Cliche in Treatment: 6 Clinic Level of Care Assessment Items TOOL 1 Quantity Score _0  - Use when EandM and Procedure is performed on INITIAL visit 0 ASSESSMENTS - Nursing Assessment /  Reassessment _1  - General Physical Exam (combine w/ comprehensive assessment (listed just below) when performed on new 0 pt. evals) _2  - 0 Comprehensive Assessment (HX, ROS, Risk Assessments, Wounds Hx, etc.) ASSESSMENTS - Wound and Skin Assessment / Reassessment _3  - Dermatologic / Skin Assessment (not related to wound area) 0 ASSESSMENTS - Ostomy and/or Continence Assessment and Care _4  - Incontinence Assessment and Management 0 _5  - 0 Ostomy Care Assessment and Management (repouching, etc.) PROCESS - Coordination of Care _6  - Simple Patient / Family Education for ongoing care 0 _7  - 0 Complex (extensive) Patient / Family Education for ongoing care _8  - 0 Staff obtains Programmer, systems, Records, Test Results / Process Orders _9  - 0 Staff telephones HHA, Nursing Homes / Clarify orders / etc _10  - 0 Routine Transfer to another Facility (non-emergent condition) _11  - 0 Routine Hospital Admission (non-emergent condition) _12  - 0 New Admissions / Biomedical engineer / Ordering NPWT, Apligraf, etc. _13  - 0 Emergency Hospital Admission (emergent condition) PROCESS - Special Needs _14  - Pediatric / Minor Patient Management 0 _15  - 0 Isolation Patient Management _16  - 0 Hearing / Language / Visual special needs _17  - 0 Assessment of Community assistance (transportation, D/C planning, etc.) _18  - 0 Additional assistance / Altered mentation _19  - 0 Support Surface(s) Assessment (bed, cushion, seat, etc.) INTERVENTIONS - Miscellaneous _20  - External ear exam 0 _21  - 0 Patient Transfer (multiple staff / Civil Service fast streamer / Similar devices) _22  - 0 Simple Staple / Suture removal (25 or less) _23  - 0 Complex Staple / Suture removal (26 or more) _24  - 0 Hypo/Hyperglycemic Management (do not check if billed separately) _25  - 0 Ankle / Brachial Index (ABI) - do not check if billed separately Has the patient been seen at the hospital within the last three years: Yes Total Score: 0 Level Of Care:  ____ Veronica Krueger ([id number]) Electronic Signature(s) Signed: 04/14/2021 4:31:00 PM By: Levora Dredge Entered By: Levora Dredge on 04/14/2021 16:24:14 Veronica Krueger ([id number]) -------------------------------------------------------------------------------- Encounter  Discharge Information Details Patient Name: Veronica Krueger, Veronica Krueger Date of Service: 04/14/2021 3:00 PM Medical Record Number: [id number] Patient Account Number: [id number] Date of Birth/Sex: [birth year] (66 y.o. F) Treating RN: Levora Dredge Primary Care Amandeep Hogston: [place]ian Co Other Clinician: Referring Akai Dollard: [place]ian Co Treating Johniya Durfee/Extender: Skipper Cliche in Treatment: 6 Encounter Discharge Information Items Post Procedure Vitals Discharge Condition: Stable Temperature (F): 98.1 Ambulatory Status: Wheelchair Pulse (bpm): 62 Discharge Destination: Home Respiratory Rate (breaths/min): 18 Transportation: Other Blood Pressure (mmHg): 173/73 Accompanied By: self Schedule Follow-up Appointment: Yes Clinical Summary of Care: Electronic Signature(s) Signed: 04/14/2021 4:26:00 PM By: Levora Dredge Entered By: Levora Dredge on 04/14/2021 16:25:59 Veronica Krueger ([id number]) -------------------------------------------------------------------------------- Lower Extremity Assessment Details Patient Name: Veronica Krueger Date of Service: 04/14/2021 3:00 PM Medical Record Number: [id number] Patient Account Number: [id number] Date of Birth/Sex: 05-06-1955 (65 y.o. F) Treating RN: Levora Dredge Primary Care Caid Radin: [place] Co Other Clinician: Referring Jaymian Bogart: [place]ian Co Treating Kimberlin Scheel/Extender: Jeri Cos Weeks in Treatment: 6 Edema Assessment Assessed: [Left: No] [Right: No] Edema: [Left: Ye] [Right: s] Calf Left: Right: Point of Measurement: 32 cm From Medial Instep 64.6 cm Ankle Left: Right: Point of Measurement: 9 cm From Medial Instep 42.5 cm Vascular  Assessment Pulses: Dorsalis Pedis Doppler Audible: [Left:Yes] Posterior Tibial Doppler Audible: [Left:Yes] Electronic Signature(s) Signed: 04/14/2021 4:31:00 PM By: Levora Dredge Entered By: Levora Dredge on 04/14/2021 15:25:51 Veronica Krueger ([id number]) -------------------------------------------------------------------------------- Multi Wound Chart Details Patient Name: Veronica Krueger Date of Service: 04/14/2021 3:00 PM Medical Record Number: [id number] Patient Account Number: [id number] Date of Birth/Sex: [birth year] (65 y.o. F) Treating RN: Levora Dredge Primary Care Kymari Lollis: [place]ian Co Other Clinician: Referring Dayna Alia: [place]ian Co Treating Mayar Whittier/Extender: Skipper Cliche in Treatment: 6 Vital Signs Height(in): 69 Pulse(bpm): 62 Weight(lbs): Blood Pressure(mmHg): 173/73 Body Mass Index(BMI): Temperature(F): 98.1 Respiratory Rate(breaths/min): 20 Photos: [N/A:N/A] Wound Location: Left, Lateral Lower Leg N/A N/A Wounding Event: Blister N/A N/A Primary Etiology: Diabetic Wound/Ulcer of the Lower N/A N/A Extremity Comorbid History: Glaucoma, Anemia, Lymphedema, N/A N/A Hypertension, Type II Diabetes, Gout Date Acquired: 12/04/2020 N/A N/A Weeks of Treatment: 6 N/A N/A Wound Status: Open N/A N/A Wound Recurrence: No N/A N/A Measurements L x W x D (cm) 5.4x7x0.2 N/A N/A Area (cm) : 29.688 N/A N/A Volume (cm) : 5.938 N/A N/A % Reduction in Area: -38.60% N/A N/A % Reduction in Volume: -38.60% N/A N/A Classification: Grade 1 N/A N/A Exudate Amount: Large N/A N/A Exudate Type: Serosanguineous N/A N/A Exudate Color: red, brown N/A N/A Granulation Amount: Small (1-33%) N/A N/A Granulation Quality: Red, Hyper-granulation N/A N/A Necrotic Amount: Large (67-100%) N/A N/A Exposed Structures: Fat Layer (Subcutaneous Tissue): N/A N/A Yes Epithelialization: Small (1-33%) N/A N/A Treatment Notes Electronic Signature(s) Signed: 04/14/2021  4:31:00 PM By: Levora Dredge Entered By: Levora Dredge on 04/14/2021 15:43:23 Veronica Krueger ([id number]) -------------------------------------------------------------------------------- Multi-Disciplinary Care Plan Details Patient Name: Veronica Krueger Date of Service: 04/14/2021 3:00 PM Medical Record Number: [id number] Patient Account Number: [id number] Date of Birth/Sex: Mar 03, 1955 (65 y.o. F) Treating RN: Levora Dredge Primary Care Ziair Penson: [place] Co Other Clinician: Referring Argelio Granier: [place]ian Co Treating Miranda Garber/Extender: Skipper Cliche in Treatment: 6 Active Inactive Wound/Skin Impairment Nursing Diagnoses: Impaired tissue integrity Knowledge deficit related to ulceration/compromised skin integrity Goals: Patient will have a decrease in wound volume by X% from date: (specify in notes) Date Initiated: 03/03/2021 Date Inactivated: 04/07/2021 Target Resolution Date: 03/31/2021 Goal Status: Met Patient/caregiver will verbalize understanding of skin care regimen Date Initiated: 03/03/2021 Target Resolution Date: 04/28/2021 Goal Status: Active Ulcer/skin breakdown will have a volume reduction  of 30% by week 4 Date Initiated: 03/03/2021 Date Inactivated: 04/07/2021 Target Resolution Date: 04/28/2021 Goal Status: Unmet Unmet Reason: comorbities Ulcer/skin breakdown will have a volume reduction of 50% by week 8 Date Initiated: 03/03/2021 Target Resolution Date: 05/26/2021 Goal Status: Active Ulcer/skin breakdown will have a volume reduction of 80% by week 12 Date Initiated: 03/03/2021 Target Resolution Date: 06/23/2021 Goal Status: Active Ulcer/skin breakdown will heal within 14 weeks Date Initiated: 03/03/2021 Target Resolution Date: 07/07/2021 Goal Status: Active Interventions: Assess patient/caregiver ability to obtain necessary supplies Assess patient/caregiver ability to perform ulcer/skin care regimen upon admission and as needed Assess ulceration(s) every  visit Provide education on ulcer and skin care Notes: Electronic Signature(s) Signed: 04/14/2021 4:31:00 PM By: Levora Dredge Entered By: Levora Dredge on 04/14/2021 15:43:15 Veronica Krueger ([id number]) -------------------------------------------------------------------------------- Pain Assessment Details Patient Name: Veronica Krueger Date of Service: 04/14/2021 3:00 PM Medical Record Number: [id number] Patient Account Number: [id number] Date of Birth/Sex: 1955-04-15 (66 y.o. F) Treating RN: Levora Dredge Primary Care Loreena Valeri: [place] Co Other Clinician: Referring Saim Almanza: [place] Co Treating Angelyn Osterberg/Extender: Skipper Cliche in Treatment: 6 Active Problems Location of Pain Severity and Description of Pain Patient Has Paino No Site Locations Rate the pain. Current Pain Level: 0 Pain Management and Medication Current Pain Management: Electronic Signature(s) Signed: 04/14/2021 4:31:00 PM By: Levora Dredge Entered By: Levora Dredge on 04/14/2021 15:18:01 Veronica Krueger ([id number]) -------------------------------------------------------------------------------- Patient/Caregiver Education Details Patient Name: Veronica Krueger Date of Service: 04/14/2021 3:00 PM Medical Record Number: [id number] Patient Account Number: [id number] Date of Birth/Gender: 16-Jan-1956 (65 y.o. F) Treating RN: Levora Dredge Primary Care Physician: Georgian Co Other Clinician: Referring Physician: Georgian Co Treating Physician/Extender: Skipper Cliche in Treatment: 6 Education Assessment Education Provided To: Patient Education Topics Provided Wound/Skin Impairment: Handouts: Caring for Your Ulcer Methods: Explain/Verbal Responses: State content correctly Electronic Signature(s) Signed: 04/14/2021 4:31:00 PM By: Levora Dredge Entered By: Levora Dredge on 04/14/2021 16:24:45 Veronica Krueger  ([id number]) -------------------------------------------------------------------------------- Wound Assessment Details Patient Name: Veronica Krueger Date of Service: 04/14/2021 3:00 PM Medical Record Number: [id number] Patient Account Number: [id number] Date of Birth/Sex: Jul 18, 1955 (65 y.o. F) Treating RN: Levora Dredge Primary Care Rustin Erhart: [place] Co Other Clinician: Referring Beuna Bolding: [place]ian Co Treating Mendell Bontempo/Extender: Jeri Cos Weeks in Treatment: 6 Wound Status Wound Number: 1 Primary Diabetic Wound/Ulcer of the Lower Extremity Etiology: Wound Location: Left, Lateral Lower Leg Wound Status: Open Wounding Event: Blister Comorbid Glaucoma, Anemia, Lymphedema, Hypertension, Type Date Acquired: 12/04/2020 History: II Diabetes, Gout Weeks Of Treatment: 6 Clustered Wound: No Photos Wound Measurements Length: (cm) 5.4 Width: (cm) 7 Depth: (cm) 0.2 Area: (cm) 29.688 Volume: (cm) 5.938 % Reduction in Area: -38.6% % Reduction in Volume: -38.6% Epithelialization: Small (1-33%) Tunneling: No Undermining: No Wound Description Classification: Grade 1 Exudate Amount: Large Exudate Type: Serosanguineous Exudate Color: red, brown Foul Odor After Cleansing: No Slough/Fibrino Yes Wound Bed Granulation Amount: Small (1-33%) Exposed Structure Granulation Quality: Red, Hyper-granulation Fat Layer (Subcutaneous Tissue) Exposed: Yes Necrotic Amount: Large (67-100%) Necrotic Quality: Adherent Slough Treatment Notes Wound #1 (Lower Leg) Wound Laterality: Left, Lateral Cleanser Peri-Wound Care Topical Primary Dressing Hydrofera Blue Ready Transfer Foam, 2.5x2.5 (in/in) Discharge Instruction: Apply Hydrofera Blue Ready to wound bed as directed Veronica Krueger, Veronica Krueger ([id number]) Secondary Dressing Zetuvit Plus Silicone Non-bordered 5x5 (in/in) Secured With Compression Wrap Medichoice 4 layer Compression System, 35-40 mmHG Discharge Instruction: Apply multi-layer  wrap as directed. Compression Stockings Add-Ons Electronic Signature(s) Signed: 04/14/2021 4:31:00 PM By: Levora Dredge Entered By: Levora Dredge on 04/14/2021 15:23:46 Veronica Krueger ([id number]) -------------------------------------------------------------------------------- Vitals Details Patient  Name: Veronica Krueger, Veronica Krueger Date of Service: 04/14/2021 3:00 PM Medical Record Number: [id number] Patient Account Number: [id number] Date of Birth/Sex: [birth year] (66 y.o. F) Treating RN: Levora Dredge Primary Care Kristian Mogg: [place]ian Co Other Clinician: Referring Curvin Hunger: [place]ian Co Treating Mavric Cortright/Extender: Skipper Cliche in Treatment: 6 Vital Signs Time Taken: 15:12 Temperature (F): 98.1 Height (in): 69 Pulse (bpm): 62 Respiratory Rate (breaths/min): 20 Blood Pressure (mmHg): 173/73 Reference Range: 80 - 120 mg / dl Electronic Signature(s) Signed: 04/14/2021 4:31:00 PM By: Levora Dredge Entered By: Levora Dredge on 04/14/2021 15:14:32

## 2021-04-14 NOTE — Progress Notes (Addendum)
Veronica, Krueger ([id number]) Visit Report for 04/14/2021 Chief Complaint Document Details Patient Name: Veronica Krueger, Veronica Krueger Date of Service: 04/14/2021 3:00 PM Medical Record Number: [id number] Patient Account Number: [id number] Date of Birth/Sex: [birth year] (66 y.o. F) Treating RN: Levora Dredge Primary Care Provider: Georgian Co Other Clinician: Referring Provider: Georgian Co Treating Provider/Extender: Skipper Cliche in Treatment: 6 Information Obtained from: Patient Chief Complaint Left LE Ulcer Electronic Signature(s) Signed: 04/14/2021 3:03:00 PM By: Worthy Keeler PA-C Entered By: Worthy Keeler on 04/14/2021 15:02:59 Veronica Krueger ([id number]) -------------------------------------------------------------------------------- Debridement Details Patient Name: Veronica Krueger Date of Service: 04/14/2021 3:00 PM Medical Record Number: [id number] Patient Account Number: [id number] Date of Birth/Sex: [birth year] (65 y.o. F) Treating RN: Levora Dredge Primary Care Provider: Georgian Co Other Clinician: Referring Provider: Georgian Co Treating Provider/Extender: Skipper Cliche in Treatment: 6 Debridement Performed for Wound #1 Left,Lateral Lower Leg Assessment: Performed By: Physician Tommie Sams., PA-C Debridement Type: Debridement Severity of Tissue Pre Debridement: Fat layer exposed Level of Consciousness (Pre- Awake and Alert procedure): Pre-procedure Verification/Time Out Yes - 15:42 Taken: Total Area Debrided (L x W): 5.4 (cm) x 7 (cm) = 37.8 (cm) Tissue and other material Viable, Non-Viable, Slough, Subcutaneous, Biofilm, Slough debrided: Level: Skin/Subcutaneous Tissue Debridement Description: Excisional Instrument: Curette Bleeding: Minimum Hemostasis Achieved: Pressure Response to Treatment: Procedure was tolerated well Level of Consciousness (Post- Awake and Alert procedure): Post Debridement Measurements of Total  Wound Length: (cm) 5.4 Width: (cm) 7 Depth: (cm) 0.2 Volume: (cm) 5.938 Character of Wound/Ulcer Post Debridement: Stable Severity of Tissue Post Debridement: Fat layer exposed Post Procedure Diagnosis Same as Pre-procedure Electronic Signature(s) Signed: 04/14/2021 4:31:00 PM By: Levora Dredge Signed: 04/14/2021 5:14:22 PM By: Worthy Keeler PA-C Entered By: Levora Dredge on 04/14/2021 15:46:47 Veronica Krueger ([id number]) -------------------------------------------------------------------------------- HPI Details Patient Name: Veronica Krueger Date of Service: 04/14/2021 3:00 PM Medical Record Number: [id number] Patient Account Number: [id number] Date of Birth/Sex: 08/20/1955 (66 y.o. F) Treating RN: Levora Dredge Primary Care Provider: Georgian Co Other Clinician: Referring Provider: Georgian Co Treating Provider/Extender: Skipper Cliche in Treatment: 6 History of Present Illness HPI Description: 03/03/2021 upon evaluation today patient appears to be doing somewhat poorly in regard to the wound on her left lateral lower extremity. She tells me this has been there since around October 2022. It came up as a blister and she is never had anything like this before. With that being said she currently is concerned about the fact that her home health nurse with [facility] was telling her that this could potentially be infected. With that being said I am really not seeing evidence or signs of infection at this time. Right now they have been using Santyl and then along with the Santyl a wet-to-dry dressing. No compressions been utilized. She did have arterial studies when she was in the hospital in November showed that she had an ABI on the right of 0.78 on the left and 0.75 which is obviously not optimal. The patient does have peripheral vascular disease, diabetes mellitus type 2 though she tells me is diet controlled she takes no medicines and does not check her blood sugars.  She also has a history of hypertension and chronic venous insufficiency along with lymphedema that I see today. 03/17/2021 upon evaluation today patient appears to be doing okay in regard to her wound though still appears to be very sloughy. She is also having some increased pain and according to the home health nurse, nurse here in the clinic, the patient this appears to  have some odor as well. Again I am not able to smell this but again that is more of a personal issue with my olfactory sense since COVID. Either way I think that right now the concern is this possibly is an infection we do need to address that. I do not want her to get worse by any means. We are still waiting on her to get scheduled for the arterial study which I think needs to be done ASAP. 03/24/2020 upon evaluation today patient appears to be doing well with regard to her wound. She has been tolerating the dressing changes and with the new Levaquin antibiotic she is doing much better as well. Overall I am extremely pleased with where we stand currently. 04/07/2021 upon evaluation patient's wound bed actually showed signs of some slight improvements. I am actually pleased in that regard. Fortunately I do not see any evidence of active infection locally or systemically which is great news and overall I think we are headed in the appropriate direction. The patient is pleased to hear this she does have an appointment with the vascular doctor on Wednesday to see what needs to be done to improve her arterial flow. 04/14/2021 upon evaluation today patient appears to be doing well with regard to her wound all things considered. She does need some sharp debridement to clear away necrotic debris. With that being said I discussed that with her today and that is something that now that we know her arterial study is good we can definitely look into proceeding with at this point. Overall her TBI's were stated to be normal although she  was noncompressible as far as ABIs were concerned this is great news. Electronic Signature(s) Signed: 04/14/2021 4:46:59 PM By: Worthy Keeler PA-C Entered By: Worthy Keeler on 04/14/2021 16:46:59 Veronica, Krueger ([id number]) -------------------------------------------------------------------------------- Physical Exam Details Patient Name: Veronica Krueger Date of Service: 04/14/2021 3:00 PM Medical Record Number: [id number] Patient Account Number: [id number] Date of Birth/Sex: 1956-02-17 (65 y.o. F) Treating RN: Levora Dredge Primary Care Provider: Georgian Co Other Clinician: Referring Provider: Georgian Co Treating Provider/Extender: Jeri Cos Weeks in Treatment: 6 Constitutional Obese and well-hydrated in no acute distress. Respiratory normal breathing without difficulty. Psychiatric this patient is able to make decisions and demonstrates good insight into disease process. Alert and Oriented x 3. pleasant and cooperative. Notes Upon evaluation patient's wound is showing signs of fairly good improvement at this point. I am actually very pleased with where things stand today. I do not see any evidence of active infection I did actually perform some sharp debridement clear away some of the necrotic debris she tolerated this without complication postdebridement the wound bed appears to be doing much better. Electronic Signature(s) Signed: 04/14/2021 4:47:33 PM By: Worthy Keeler PA-C Entered By: Worthy Keeler on 04/14/2021 16:47:33 Veronica Krueger ([id number]) -------------------------------------------------------------------------------- Physician Orders Details Patient Name: Veronica Krueger Date of Service: 04/14/2021 3:00 PM Medical Record Number: [id number] Patient Account Number: [id number] Date of Birth/Sex: Sep 05, 1955 (65 y.o. F) Treating RN: Levora Dredge Primary Care Provider: Georgian Co Other Clinician: Referring Provider: Georgian Co Treating Provider/Extender: Skipper Cliche in Treatment: 6 Verbal / Phone Orders: No Diagnosis Coding ICD-10 Coding Code Description I89.0 Lymphedema, not elsewhere classified L97.822 Non-pressure chronic ulcer of other part of left lower leg with fat layer exposed I73.89 Other specified peripheral vascular diseases I87.2 Venous insufficiency (chronic) (peripheral) I10 Essential (primary) hypertension E11.622 Type 2 diabetes mellitus with other skin ulcer Follow-up Appointments o Return Appointment in 2  weeks. o Nurse Visit as needed [facility] for wound care. May utilize formulary equivalent dressing for wound treatment orders unless otherwise specified. Home Health Nurse may visit [facility] to address patientos wound care needs. o **Please direct any NON-WOUND related issues/requests for orders to patient's Primary Care Physician. **If current dressing causes regression in wound condition, may D/C ordered dressing product/s and apply Normal Saline Moist Dressing daily until next [place] or Other MD appointment. **Notify [facility] of regression in wound condition at [phone]. o Other Home Health Orders/Instructions: - if using hydrafera blue with the covering to back with wording on it, please cut slits into back of hydrfera blue prior to apply to wound bed. Bathing/ Shower/ Hygiene o Wash wounds with antibacterial soap and water. o No tub bath. o Other: - We will do Monday dressing change and that week, HH will change Wednesday and Friday. On weeks patient is not being seen in wound clinic, [place] will need to change dressing and wrap Monday, Wednesday and Friday. Edema Control - Lymphedema / Segmental Compressive Device / Other Bilateral Lower Extremities o Optional: One layer of unna paste to top of compression wrap (to act as an anchor). o Elevate, Exercise Daily and  Avoid Standing for Long Periods of Time. o Elevate legs to the level of the heart and pump ankles as often as possible o Elevate leg(s) parallel to the floor when sitting. o DO YOUR BEST to sleep in the bed at night. DO NOT sleep in your recliner. Long hours of sitting in a recliner leads to swelling of the legs and/or potential wounds on your backside. Wound Treatment Wound #1 - Lower Leg Wound Laterality: Left, Lateral Primary Dressing: Hydrofera Blue Ready Transfer Foam, 2.5x2.5 (in/in) 3 x Per Week/30 Days Discharge Instructions: Apply Hydrofera Blue Ready to wound bed as directed Secondary Dressing: Zetuvit Plus Silicone Non-bordered 5x5 (in/in) 3 x Per Week/30 Days Compression Wrap: Medichoice 4 layer Compression System, 35-40 mmHG 3 x Per Week/30 Days Discharge Instructions: Apply multi-layer wrap as directed. Electronic Signature(s) Signed: 04/14/2021 4:31:00 PM By: Levora Dredge Signed: 04/14/2021 5:14:22 PM By: Waynard Reeds, Reginold Agent ([id number]) Entered By: Levora Dredge on 04/14/2021 16:20:36 JENNETT, TARBELL ([id number]) -------------------------------------------------------------------------------- Problem List Details Patient Name: Veronica Krueger Date of Service: 04/14/2021 3:00 PM Medical Record Number: [id number] Patient Account Number: [id number] Date of Birth/Sex: [birth year] (65 y.o. F) Treating RN: Levora Dredge Primary Care Provider: Georgian Co Other Clinician: Referring Provider: Georgian Co Treating Provider/Extender: Skipper Cliche in Treatment: 6 Active Problems ICD-10 Encounter Code Description Active Date MDM Diagnosis I89.0 Lymphedema, not elsewhere classified 03/03/2021 No Yes L97.822 Non-pressure chronic ulcer of other part of left lower leg with fat layer 03/03/2021 No Yes exposed I73.89 Other specified peripheral vascular diseases 03/03/2021 No Yes I87.2 Venous insufficiency (chronic) (peripheral) 03/03/2021 No  Yes I10 Essential (primary) hypertension 03/03/2021 No Yes E11.622 Type 2 diabetes mellitus with other skin ulcer 03/03/2021 No Yes Inactive Problems Resolved Problems Electronic Signature(s) Signed: 04/14/2021 3:02:50 PM By: Worthy Keeler PA-C Entered By: Worthy Keeler on 04/14/2021 15:02:50 Veronica Krueger ([id number]) -------------------------------------------------------------------------------- Progress Note Details Patient Name: Veronica Krueger Date of Service: 04/14/2021 3:00 PM Medical Record Number: [id number] Patient Account Number: [id number] Date of Birth/Sex: May 25, 1955 (65 y.o. F) Treating RN: Levora Dredge Primary Care Provider: Georgian Co Other Clinician: Referring Provider: Georgian Co Treating Provider/Extender: Skipper Cliche in Treatment: 6 Subjective Chief Complaint Information obtained  from Patient Left LE Ulcer History of Present Illness (HPI) 03/03/2021 upon evaluation today patient appears to be doing somewhat poorly in regard to the wound on her left lateral lower extremity. She tells me this has been there since around October 2022. It came up as a blister and she is never had anything like this before. With that being said she currently is concerned about the fact that her home health nurse with [facility] was telling her that this could potentially be infected. With that being said I am really not seeing evidence or signs of infection at this time. Right now they have been using Santyl and then along with the Santyl a wet-to-dry dressing. No compressions been utilized. She did have arterial studies when she was in the hospital in November showed that she had an ABI on the right of 0.78 on the left and 0.75 which is obviously not optimal. The patient does have peripheral vascular disease, diabetes mellitus type 2 though she tells me is diet controlled she takes no medicines and does not check her blood sugars. She also has a history of  hypertension and chronic venous insufficiency along with lymphedema that I see today. 03/17/2021 upon evaluation today patient appears to be doing okay in regard to her wound though still appears to be very sloughy. She is also having some increased pain and according to the home health nurse, nurse here in the clinic, the patient this appears to have some odor as well. Again I am not able to smell this but again that is more of a personal issue with my olfactory sense since COVID. Either way I think that right now the concern is this possibly is an infection we do need to address that. I do not want her to get worse by any means. We are still waiting on her to get scheduled for the arterial study which I think needs to be done ASAP. 03/24/2020 upon evaluation today patient appears to be doing well with regard to her wound. She has been tolerating the dressing changes and with the new Levaquin antibiotic she is doing much better as well. Overall I am extremely pleased with where we stand currently. 04/07/2021 upon evaluation patient's wound bed actually showed signs of some slight improvements. I am actually pleased in that regard. Fortunately I do not see any evidence of active infection locally or systemically which is great news and overall I think we are headed in the appropriate direction. The patient is pleased to hear this she does have an appointment with the vascular doctor on Wednesday to see what needs to be done to improve her arterial flow. 04/14/2021 upon evaluation today patient appears to be doing well with regard to her wound all things considered. She does need some sharp debridement to clear away necrotic debris. With that being said I discussed that with her today and that is something that now that we know her arterial study is good we can definitely look into proceeding with at this point. Overall her TBI's were stated to be normal although she was noncompressible as far as ABIs were  concerned this is great news. Objective Constitutional Obese and well-hydrated in no acute distress. Vitals Time Taken: 3:12 PM, Height: 69 in, Temperature: 98.1 F, Pulse: 62 bpm, Respiratory Rate: 20 breaths/min, Blood Pressure: 173/73 mmHg. Respiratory normal breathing without difficulty. Psychiatric this patient is able to make decisions and demonstrates good insight into disease process. Alert and Oriented x 3. pleasant and cooperative. General  Notes: Upon evaluation patient's wound is showing signs of fairly good improvement at this point. I am actually very pleased with where things stand today. I do not see any evidence of active infection I did actually perform some sharp debridement clear away some of the necrotic debris she tolerated this without complication postdebridement the wound bed appears to be doing much better. Integumentary (Hair, Skin) SYBRINA, LANING ([id number]) Wound #1 status is Open. Original cause of wound was Blister. The date acquired was: 12/04/2020. The wound has been in treatment 6 weeks. The wound is located on the Left,Lateral Lower Leg. The wound measures 5.4cm length x 7cm width x 0.2cm depth; 29.688cm^2 area and 5.938cm^3 volume. There is Fat Layer (Subcutaneous Tissue) exposed. There is no tunneling or undermining noted. There is a large amount of serosanguineous drainage noted. There is small (1-33%) red, hyper - granulation within the wound bed. There is a large (67-100%) amount of necrotic tissue within the wound bed including Adherent Slough. Assessment Active Problems ICD-10 Lymphedema, not elsewhere classified Non-pressure chronic ulcer of other part of left lower leg with fat layer exposed Other specified peripheral vascular diseases Venous insufficiency (chronic) (peripheral) Essential (primary) hypertension Type 2 diabetes mellitus with other skin ulcer Procedures Wound #1 Pre-procedure diagnosis of Wound #1 is a Diabetic Wound/Ulcer  of the Lower Extremity located on the Left,Lateral Lower Leg .Severity of Tissue Pre Debridement is: Fat layer exposed. There was a Excisional Skin/Subcutaneous Tissue Debridement with a total area of 37.8 sq cm performed by Tommie Sams., PA-C. With the following instrument(s): Curette to remove Viable and Non-Viable tissue/material. Material removed includes Subcutaneous Tissue, Slough, and Biofilm. No specimens were taken. A time out was conducted at 15:42, prior to the start of the procedure. A Minimum amount of bleeding was controlled with Pressure. The procedure was tolerated well. Post Debridement Measurements: 5.4cm length x 7cm width x 0.2cm depth; 5.938cm^3 volume. Character of Wound/Ulcer Post Debridement is stable. Severity of Tissue Post Debridement is: Fat layer exposed. Post procedure Diagnosis Wound #1: Same as Pre-Procedure Plan Follow-up Appointments: Return Appointment in 2 weeks. Nurse Visit as needed Home Health: [facility]: - [place] for wound care. May utilize formulary equivalent dressing for wound treatment orders unless otherwise specified. Home Health Nurse may visit [facility] to address patient s wound care needs. **Please direct any NON-WOUND related issues/requests for orders to patient's Primary Care Physician. **If current dressing causes regression in wound condition, may D/C ordered dressing product/s and apply Normal Saline Moist Dressing daily until next [place] or Other MD appointment. **Notify [facility] of regression in wound condition at [phone]. Other Home Health Orders/Instructions: - if using hydrafera blue with the covering to back with wording on it, please cut slits into back of hydrfera blue prior to apply to wound bed. Bathing/ Shower/ Hygiene: Wash wounds with antibacterial soap and water. No tub bath. Other: - We will do Monday dressing change and that week, HH will change Wednesday and  Friday. On weeks patient is not being seen in wound clinic, [place] will need to change dressing and wrap Monday, Wednesday and Friday. Edema Control - Lymphedema / Segmental Compressive Device / Other: Optional: One layer of unna paste to top of compression wrap (to act as an anchor). Elevate, Exercise Daily and Avoid Standing for Long Periods of Time. Elevate legs to the level of the heart and pump ankles as often as possible Elevate leg(s) parallel to the floor when sitting.  DO YOUR BEST to sleep in the bed at night. DO NOT sleep in your recliner. Long hours of sitting in a recliner leads to swelling of the legs and/or potential wounds on your backside. WOUND #1: - Lower Leg Wound Laterality: Left, Lateral Primary Dressing: Hydrofera Blue Ready Transfer Foam, 2.5x2.5 (in/in) 3 x Per Week/30 Days Discharge Instructions: Apply Hydrofera Blue Ready to wound bed as directed Secondary Dressing: Zetuvit Plus Silicone Non-bordered 5x5 (in/in) 3 x Per Week/30 Days Compression Wrap: Medichoice 4 layer Compression System, 35-40 mmHG 3 x Per Week/30 Days Discharge Instructions: Apply multi-layer wrap as directed. FREDERICK, KLINGER ([id number]) 1. I am good recommend currently that we go ahead and continue with the wound care measures as before and the patient is in agreement with the plan. This includes the use of the [facility] which I think is not a good job. 2. We will continue to cover with a Zetuvit. 3. We will initiate a 4-layer compression wrap which I think will do much better for her. We will see patient back for reevaluation in 1 week here in the clinic. If anything worsens or changes patient will contact our office for additional recommendations. Electronic Signature(s) Signed: 04/14/2021 4:48:00 PM By: Worthy Keeler PA-C Entered By: Worthy Keeler on 04/14/2021 16:47:59 Veronica Krueger  ([id number]) -------------------------------------------------------------------------------- SuperBill Details Patient Name: Veronica Krueger Date of Service: 04/14/2021 Medical Record Number: [id number] Patient Account Number: [id number] Date of Birth/Sex: Jul 13, 1955 (65 y.o. F) Treating RN: Levora Dredge Primary Care Provider: Georgian Co Other Clinician: Referring Provider: Georgian Co Treating Provider/Extender: Skipper Cliche in Treatment: 6 Diagnosis Coding ICD-10 Codes Code Description I89.0 Lymphedema, not elsewhere classified L97.822 Non-pressure chronic ulcer of other part of left lower leg with fat layer exposed I73.89 Other specified peripheral vascular diseases I87.2 Venous insufficiency (chronic) (peripheral) I10 Essential (primary) hypertension E11.622 Type 2 diabetes mellitus with other skin ulcer Facility Procedures CPT4 Code: 42706237 Description: 62831 - DEB SUBQ TISSUE 20 SQ CM/< Modifier: Quantity: 1 CPT4 Code: Description: ICD-10 Diagnosis Description L97.822 Non-pressure chronic ulcer of other part of left lower leg with fat layer Modifier: exposed Quantity: CPT4 Code: [id number] Description: 37106 - DEB SUBQ TISS EA ADDL 20CM Modifier: Quantity: 1 CPT4 Code: Description: ICD-10 Diagnosis Description L97.822 Non-pressure chronic ulcer of other part of left lower leg with fat layer Modifier: exposed Quantity: Physician Procedures CPT4 Code: 2694854 Description: 11042 - WC PHYS SUBQ TISS 20 SQ CM Modifier: Quantity: 1 CPT4 Code: Description: ICD-10 Diagnosis Description L97.822 Non-pressure chronic ulcer of other part of left lower leg with fat layer Modifier: exposed Quantity: CPT4 Code: 6270350 Description: 09381 - WC PHYS SUBQ TISS EA ADDL 20 CM Modifier: Quantity: 1 CPT4 Code: Description: ICD-10 Diagnosis Description L97.822 Non-pressure chronic ulcer of other part of left lower leg with fat layer Modifier:  exposed Quantity: Electronic Signature(s) Signed: 04/14/2021 4:51:04 PM By: Worthy Keeler PA-C Entered By: Worthy Keeler on 04/14/2021 16:51:03

## 2021-04-21 ENCOUNTER — Ambulatory Visit: Payer: Medicare HMO | Admitting: Physician Assistant

## 2021-04-22 DIAGNOSIS — S81812A Laceration without foreign body, left lower leg, initial encounter: Secondary | ICD-10-CM | POA: Diagnosis not present

## 2021-04-28 ENCOUNTER — Ambulatory Visit: Payer: Medicare HMO | Admitting: Physician Assistant

## 2021-05-12 ENCOUNTER — Encounter: Payer: Medicare HMO | Attending: Physician Assistant | Admitting: Physician Assistant

## 2021-05-12 ENCOUNTER — Other Ambulatory Visit: Payer: Self-pay

## 2021-05-12 DIAGNOSIS — I10 Essential (primary) hypertension: Secondary | ICD-10-CM | POA: Diagnosis not present

## 2021-05-12 DIAGNOSIS — L97822 Non-pressure chronic ulcer of other part of left lower leg with fat layer exposed: Secondary | ICD-10-CM | POA: Diagnosis not present

## 2021-05-12 DIAGNOSIS — E11622 Type 2 diabetes mellitus with other skin ulcer: Secondary | ICD-10-CM | POA: Insufficient documentation

## 2021-05-12 DIAGNOSIS — E1151 Type 2 diabetes mellitus with diabetic peripheral angiopathy without gangrene: Secondary | ICD-10-CM | POA: Diagnosis not present

## 2021-05-12 DIAGNOSIS — I872 Venous insufficiency (chronic) (peripheral): Secondary | ICD-10-CM | POA: Diagnosis not present

## 2021-05-12 DIAGNOSIS — I890 Lymphedema, not elsewhere classified: Secondary | ICD-10-CM | POA: Diagnosis not present

## 2021-05-12 NOTE — Progress Notes (Addendum)
LILLAR, BIANCA ([id number]) ?Visit Report for 05/12/2021 ?Arrival Information Details ?Patient Name: GINNI, EICHLER ?Date of Service: 05/12/2021 3:15 PM ?Medical Record Number: [id number] ?Patient Account Number: [id number] ?Date of Birth/Sex: [birth year] (66 y.o. F) ?Treating RN: Donnamarie Poag ?Primary Care Trendon Zaring: [facility] Other Clinician: ?Referring Javarian Jakubiak: [facility] ?Treating Stacye Noori/Extender: Jeri Cos ?Weeks in Treatment: 10 ?Visit Information History Since Last Visit ?Added or deleted any medications: No ?Patient Arrived: Wheel Chair ?Had a fall or experienced change in No ?Arrival Time: 15:21 ?activities of daily living that may affect ?Accompanied By: self ?risk of falls: ?Transfer Assistance: EasyP[employer] Patient Lift ?Hospitalized since last visit: No ?Patient Identification Verified: Yes ?Has Dressing in Place as Prescribed: Yes ?Secondary Verification Process Completed: Yes ?Has Compression in Place as Prescribed: Yes ?Patient Requires Transmission-Based No ?Pain Present Now: No ?Precautions: ?Patient Has Alerts: Yes ?Patient Alerts: diabetic type 2 ?04/10/31 ABI R 0.92 L ?1.04 ?Electronic Signature(s) ?Signed: 05/12/2021 4:17:36 PM By: Donnamarie Poag ?Entered ByDonnamarie Poag on 05/12/2021 15:23:32 ?JAMILLE, FISHER ([id number]) ?-------------------------------------------------------------------------------- ?Compression Therapy Details ?Patient Name: DARICA, GOREN ?Date of Service: 05/12/2021 3:15 PM ?Medical Record Number: [id number] ?Patient Account Number: [id number] ?Date of Birth/Sex: 01/19/1956 (66 y.o. F) ?Treating RN: Donnamarie Poag ?Primary Care Taylon Coole: [facility] Other Clinician: ?Referring Lilliona Blakeney: [facility] ?Treating Keelyn Fjelstad/Extender: Jeri Cos ?Weeks in Treatment: 10 ?Compression Therapy Performed for Wound Assessment: Wound #1 Left,Lateral Lower Leg ?Performed By: Clinician Donnamarie Poag, RN ?Compression Type: Four Layer ?Post Procedure Diagnosis ?Same as  Pre-procedure ?Electronic Signature(s) ?Signed: 05/12/2021 4:17:36 PM By: Donnamarie Poag ?Entered By: Donnamarie Poag on 05/12/2021 15:46:26 ?[name] ([id number]) ?-------------------------------------------------------------------------------- ?Encounter Discharge Information Details ?Patient Name: NOHELIA, VALENZA ?Date of Service: 05/12/2021 3:15 PM ?Medical Record Number: [id number] ?Patient Account Number: [id number] ?Date of Birth/Sex: 07/25/1955 (66 y.o. F) ?Treating RN: Donnamarie Poag ?Primary Care Clancy Leiner: [facility] Other Clinician: ?Referring Jaedin Trumbo: [facility] ?Treating Bilan Tedesco/Extender: Jeri Cos ?Weeks in Treatment: 10 ?Encounter Discharge Information Items ?Discharge Condition: Stable ?Ambulatory Status: Wheelchair ?Discharge Destination: Home Health ?Telephoned: No ?Orders Sent: Yes ?Transportation: Private Auto ?Accompanied By: self ?Schedule Follow-up Appointment: Yes ?Clinical Summary of Care: ?Electronic Signature(s) ?Signed: 05/12/2021 4:17:36 PM By: Donnamarie Poag ?Entered ByDonnamarie Poag on 05/12/2021 15:49:03 ?SHYANNA, KLINGEL ([id number]) ?-------------------------------------------------------------------------------- ?Lower Extremity Assessment Details ?Patient Name: CHARMON, THORSON ?Date of Service: 05/12/2021 3:15 PM ?Medical Record Number: [id number] ?Patient Account Number: [id number] ?Date of Birth/Sex: 23-Jul-1955 (66 y.o. F) ?Treating RN: Donnamarie Poag ?Primary Care Willson Lipa: [facility] Other Clinician: ?Referring Fairy Ashlock: [facility] ?Treating Korde Jeppsen/Extender: Jeri Cos ?Weeks in Treatment: 10 ?Edema Assessment ?Assessed: [Left: Yes] [Right: No] ?Edema: [Left: Ye] [Right: s] ?Calf ?Left: Right: ?Point of Measurement: 32 cm From Medial Instep 65 cm ?Ankle ?Left: Right: ?Point of Measurement: 9 cm From Medial Instep 40.8 cm ?Vascular Assessment ?Pulses: ?Dorsalis Pedis ?Palpable: [Left:No Yes] ?Electronic Signature(s) ?Signed: 05/12/2021 4:17:36 PM By: Donnamarie Poag ?Entered ByDonnamarie Poag on 05/12/2021 15:37:49 ?SIOBHAN, ZARO ([id number]) ?-------------------------------------------------------------------------------- ?Multi Wound Chart Details ?Patient Name: DEANNE, BEDGOOD ?Date of Service: 05/12/2021 3:15 PM ?Medical Record Number: [id number] ?Patient Account Number: [id number] ?Date of Birth/Sex: 1955/09/06 (66 y.o. 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  Hypertension, Type II Diabetes, ?Gout ?Date Acquired: 12/04/2020 N/A N/A ?Weeks of Treatment: 10 N/A N/A ?Wound Status: Open N/A N/A ?Wound Recurrence: No N/A N/A ?Measurements L x W x D (cm) 4.8x5.4x0.2 N/A N/A ?Area (cm?) : 20.358 N/A N/A ?Volume (cm?) : 4.072 N/A N/A ?% Reduction in Area: 5.00% N/A N/A ?% Reduction in Volume: 5.00% N/A N/A ?Classification: Grade 1 N/A N/A ?Exudate Amount: Large N/A N/A ?Exudate Type: Serosanguineous N/A N/A ?Exudate Color: red, brown N/A N/A ?Granulation Amount: Large (67-100%) N/A N/A ?Granulation Quality: Red, Hyper-granulation N/A N/A ?Necrotic Amount: Small (1-33%) N/A N/A ?Exposed Structures: ?Fat Layer (Subcutaneous Tissue): N/A N/A ?Yes ?Epithelialization: Small (1-33%) N/A N/A ?Treatment Notes ?Electronic Signature(s) ?Signed: 05/12/2021 4:17:36 PM By: Donnamarie Poag ?Entered ByDonnamarie Poag on 05/12/2021 15:45:55 ?MURLEAN, SEELYE  ([id number]) ?-------------------------------------------------------------------------------- ?Multi-Disciplinary Care Plan Details ?Patient Name: JORDIN, DAMBROSIO ?Date of Service: 05/12/2021 3:15 PM ?Medical Record Number: [id number] ?Patient Account Number: [id number] ?Date of Birth/Sex: [birth year] (66 y.o. F) ?Treating RN: Donnamarie Poag ?Primary Care Love Milbourne: [facility] Other Clinician: ?Referring Nasif Bos: [facility] ?Treating Myha Arizpe/Extender: Jeri Cos ?Weeks in Treatment: 10 ?Active Inactive ?Wound/Skin Impairment ?Nursing Diagnoses: ?Impaired tissue integrity ?Knowledge deficit related to ulceration/compromised skin integrity ?Goals: ?Patient will have a decrease in wound volume by X% from date: (specify in notes) ?Date Initiated: 03/03/2021 ?Date Inactivated: 04/07/2021 ?Target Resolution Date: 03/31/2021 ?Goal Status: Met ?Patient/caregiver will verbalize understanding of skin care regimen ?Date Initiated: 03/03/2021 ?Date Inactivated: 05/12/2021 ?Target Resolution Date: 04/28/2021 ?Goal Status: Met ?Ulcer/skin breakdown will have a volume reduction of 30% by week 4 ?Date Initiated: 03/03/2021 ?Date Inactivated: 04/07/2021 ?Target Resolution Date: 04/28/2021 ?Goal Status: Unmet ?Unmet Reason: comorbities ?Ulcer/skin breakdown will have a volume reduction of 50% by week 8 ?Date Initiated: 03/03/2021 ?Target Resolution Date: 05/26/2021 ?Goal Status: Active ?Ulcer/skin breakdown will have a volume reduction of 80% by week 12 ?Date Initiated: 03/03/2021 ?Target Resolution Date: 06/23/2021 ?Goal Status: Active ?Ulcer/skin breakdown will heal within 14 weeks ?Date Initiated: 03/03/2021 ?Target Resolution Date: 07/07/2021 ?Goal Status: Active ?Interventions: ?Assess patient/caregiver ability to obtain necessary supplies ?Assess patient/caregiver ability to perform ulcer/skin care regimen upon admission and as needed ?Assess ulceration(s) every visit ?Provide education on ulcer and skin care ?Notes: ?Electronic  Signature(s) ?Signed: 05/12/2021 4:17:36 PM By: Donnamarie Poag ?Entered ByDonnamarie Poag on 05/12/2021 15:37:58 ?TAFFY, DELCONTE ([id number]) ?-------------------------------------------------------------------------------- ?Pain Assessment Details ?Patient Name: PRESLY, STEINRUCK ?Date of Service: 05/12/2021 3:15 PM ?Medical Record Num

## 2021-05-12 NOTE — Progress Notes (Addendum)
PAHOUA, SCHREINER ([id number]) ?Visit Report for 05/12/2021 ?Chief Complaint Document Details ?Patient Name: Veronica Krueger, Veronica Krueger ?Date of Service: 05/12/2021 3:15 PM ?Medical Record Number: [id number] ?Patient Account Number: [id number] ?Date of Birth/Sex: 1955-09-08 (66 y.o. F) ?Treating RN: Donnamarie Poag ?Primary Care Provider: [facility] Other Clinician: ?Referring Provider: [facility] ?Treating Provider/Extender: Jeri Cos ?Weeks in Treatment: 10 ?Information Obtained from: Patient ?Chief Complaint ?Left LE Ulcer ?Electronic Signature(s) ?Signed: 05/12/2021 3:05:21 PM By: Worthy Keeler PA-C ?Entered By: Worthy Keeler on 05/12/2021 15:05:20 ?XYLA, LEISNER ([id number]) ?-------------------------------------------------------------------------------- ?HPI Details ?Patient Name: Veronica Krueger, Veronica Krueger ?Date of Service: 05/12/2021 3:15 PM ?Medical Record Number: [id number] ?Patient Account Number: [id number] ?Date of Birth/Sex: 08-20-1955 (66 y.o. F) ?Treating RN: Donnamarie Poag ?Primary Care Provider: [facility] Other Clinician: ?Referring Provider: [facility] ?Treating Provider/Extender: Jeri Cos ?Weeks in Treatment: 10 ?History of Present Illness ?HPI Description: 03/03/2021 upon evaluation today patient appears to be doing somewhat poorly in regard to the wound on her left lateral lower ?extremity. She tells me this has been there since around October 2022. It came up as a blister and she is never had anything like this before. ?With that being said she currently is concerned about the fact that her home health nurse with [facility] was telling her that this could potentially ?be infected. With that being said I am really not seeing evidence or signs of infection at this time. Right now they have been using Santyl and then ?along with the Santyl a wet-to-dry dressing. No compressions been utilized. She did have arterial studies when she was in the hospital in ?November showed that she had an ABI on the  right of 0.78 on the left and 0.75 which is obviously not optimal. ?The patient does have peripheral vascular disease, diabetes mellitus type 2 though she tells me is diet controlled she takes no medicines and does ?not check her blood sugars. She also has a history of hypertension and chronic venous insufficiency along with lymphedema that I see today. ?03/17/2021 upon evaluation today patient appears to be doing okay in regard to her wound though still appears to be very sloughy. She is also ?having some increased pain and according to the home health nurse, nurse here in the clinic, the patient this appears to have some odor as well. ?Again I am not able to smell this but again that is more of a personal issue with my olfactory sense since COVID. Either way I think that right now ?the concern is this possibly is an infection we do need to address that. I do not want her to get worse by any means. We are still waiting on her to ?get scheduled for the arterial study which I think needs to be done ASAP. ?03/24/2020 upon evaluation today patient appears to be doing well with regard to her wound. She has been tolerating the dressing changes and ?with the new Levaquin antibiotic she is doing much better as well. Overall I am extremely pleased with where we stand currently. ?04/07/2021 upon evaluation patient's wound bed actually showed signs of some slight improvements. I am actually pleased in that regard. ?Fortunately I do not see any evidence of active infection locally or systemically which is great news and overall I think we are headed in the ?appropriate direction. The patient is pleased to hear this she does have an appointment with the vascular doctor on Wednesday to see what needs ?to be done to improve her arterial flow. ?04/14/2021 upon evaluation today patient appears to be doing  well with regard to her wound all things considered. She does need some sharp ?debridement to clear away necrotic debris. With that  being said I discussed that with her today and that is something that now that we know her ?arterial study is good we can definitely look into proceeding with at this point. Overall her TBI's were stated to be normal although she was ?noncompressible as far as ABIs were concerned this is great news. ?05/12/2021 upon evaluation today patient appears to be doing well with regard to her wound. She has been tolerating the dressing changes ?without complication. Fortunately I do not see any signs of infection and I think the [facility] is doing quite well here. ?Electronic Signature(s) ?Signed: 05/12/2021 4:12:07 PM By: Worthy Keeler PA-C ?Entered By: Worthy Keeler on 05/12/2021 16:12:07 ?SYNETHIA, ENDICOTT ([id number]) ?-------------------------------------------------------------------------------- ?Physical Exam Details ?Patient Name: Veronica Krueger, Veronica Krueger ?Date of Service: 05/12/2021 3:15 PM ?Medical Record Number: [id number] ?Patient Account Number: [id number] ?Date of Birth/Sex: [birth year] (66 y.o. F) ?Treating RN: Donnamarie Poag ?Primary Care Provider: [facility] Other Clinician: ?Referring Provider: [facility] ?Treating Provider/Extender: Jeri Cos ?Weeks in Treatment: 10 ?Constitutional ?Well-nourished and well-hydrated in no acute distress. ?Respiratory ?normal breathing without difficulty. ?Psychiatric ?this patient is able to make decisions and demonstrates good insight into disease process. Alert and Oriented x 3. pleasant and cooperative. ?Notes ?Upon inspection patient's wound bed actually showed signs of good granulation and epithelization at this point. Fortunately I do not see any ?evidence of active infection locally or systemically which is great news and overall I am extremely pleased with where we stand today. ?Electronic Signature(s) ?Signed: 05/12/2021 4:12:21 PM By: Worthy Keeler PA-C ?Entered By: Worthy Keeler on 05/12/2021 16:12:21 ?MALKY, RUDZINSKI  ([id number]) ?-------------------------------------------------------------------------------- ?Physician Orders Details ?Patient Name: TANAZIA, ACHEE ?Date of Service: 05/12/2021 3:15 PM ?Medical Record Number: [id number] ?Patient Account Number: [id number] ?Date of Birth/Sex: 1955/12/28 (66 y.o. 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 ?o Nurse Visit as needed ?Home Health ?[place] ?o [place] for wound care. May utilize formulary equivalent dressing for wound treatment orders unless ?otherwise specified. Home Health Nurse may visit [facility] to address patientos wound care needs. ?o **Please direct any NON-WOUND related issues/requests for orders to patient's Primary Care Physician. **If current ?dressing causes regression in wound condition, may D/C ordered dressing product/s and apply Normal Saline Moist ?Dressing daily until next [place] or Other MD appointment. **Notify [facility] of regression in ?wound condition at [phone]. ?o Other Home Health Orders/Instructions: - if using hydrafera blue with the covering to back with wording on it, please cut slits into ?back of hydrfera blue prior to apply to wound bed. ?Licensed conveyancer ?o Wash wounds with antibacterial soap and water. ?o No tub bath. ?o Other: - We will do Monday  dressing change and that week, HH will change Wednesday and Friday. On weeks patient is not being seen in ?wound clinic, [facility] will need to change dressing and wrap Monday, Wednesday and  Friday. ?Anesthetic (Use 'Patient Medications' Sectio

## 2021-05-14 DIAGNOSIS — Z961 Presence of intraocular lens: Secondary | ICD-10-CM | POA: Diagnosis not present

## 2021-05-14 DIAGNOSIS — H4311 Vitreous hemorrhage, right eye: Secondary | ICD-10-CM | POA: Diagnosis not present

## 2021-05-14 DIAGNOSIS — E113313 Type 2 diabetes mellitus with moderate nonproliferative diabetic retinopathy with macular edema, bilateral: Secondary | ICD-10-CM | POA: Diagnosis not present

## 2021-05-14 DIAGNOSIS — H47239 Glaucomatous optic atrophy, unspecified eye: Secondary | ICD-10-CM | POA: Diagnosis not present

## 2021-05-23 DIAGNOSIS — I129 Hypertensive chronic kidney disease with stage 1 through stage 4 chronic kidney disease, or unspecified chronic kidney disease: Secondary | ICD-10-CM | POA: Diagnosis not present

## 2021-05-23 DIAGNOSIS — N1832 Chronic kidney disease, stage 3b: Secondary | ICD-10-CM | POA: Diagnosis not present

## 2021-05-23 DIAGNOSIS — E782 Mixed hyperlipidemia: Secondary | ICD-10-CM | POA: Diagnosis not present

## 2021-05-26 ENCOUNTER — Encounter: Payer: Medicare HMO | Attending: Physician Assistant | Admitting: Physician Assistant

## 2021-05-26 DIAGNOSIS — E11622 Type 2 diabetes mellitus with other skin ulcer: Secondary | ICD-10-CM | POA: Insufficient documentation

## 2021-05-26 DIAGNOSIS — I872 Venous insufficiency (chronic) (peripheral): Secondary | ICD-10-CM | POA: Diagnosis not present

## 2021-05-26 DIAGNOSIS — I10 Essential (primary) hypertension: Secondary | ICD-10-CM | POA: Diagnosis not present

## 2021-05-26 DIAGNOSIS — E1151 Type 2 diabetes mellitus with diabetic peripheral angiopathy without gangrene: Secondary | ICD-10-CM | POA: Diagnosis not present

## 2021-05-26 DIAGNOSIS — I890 Lymphedema, not elsewhere classified: Secondary | ICD-10-CM | POA: Diagnosis not present

## 2021-05-26 DIAGNOSIS — L97822 Non-pressure chronic ulcer of other part of left lower leg with fat layer exposed: Secondary | ICD-10-CM | POA: Insufficient documentation

## 2021-05-26 NOTE — Progress Notes (Addendum)
HALSTON, FAIRCLOUGH ([id number]) ?Visit Report for 05/26/2021 ?Chief Complaint Document Details ?Patient Name: Veronica Krueger, Veronica Krueger ?Date of Service: 05/26/2021 3:00 PM ?Medical Record Number: [id number] ?Patient Account Number: [id number] ?Date of Birth/Sex: 25-Mar-1955 (66 y.o. F) ?Treating RN: Levora Dredge ?Primary Care Provider: [facility] Other Clinician: ?Referring Provider: [facility] ?Treating Provider/Extender: Jeri Cos ?Weeks in Treatment: 12 ?Information Obtained from: Patient ?Chief Complaint ?Left LE Ulcer ?Electronic Signature(s) ?Signed: 05/26/2021 3:26:37 PM By: Worthy Keeler PA-C ?Entered By: Worthy Keeler on 05/26/2021 15:26:36 ?[name] ([id number]) ?-------------------------------------------------------------------------------- ?HPI Details ?Patient Name: Veronica Krueger, Veronica Krueger ?Date of Service: 05/26/2021 3:00 PM ?Medical Record Number: [id number] ?Patient Account Number: [id number] ?Date of Birth/Sex: 1956-01-27 (66 y.o. F) ?Treating RN: Levora Dredge ?Primary Care Provider: [facility] Other Clinician: ?Referring Provider: [facility] ?Treating Provider/Extender: Jeri Cos ?Weeks in Treatment: 12 ?History of Present Illness ?HPI Description: 03/03/2021 upon evaluation today patient appears to be doing somewhat poorly in regard to the wound on her left lateral lower ?extremity. She tells me this has been there since around October 2022. It came up as a blister and she is never had anything like this before. ?With that being said she currently is concerned about the fact that her home health nurse with [facility] was telling her that this could potentially ?be infected. With that being said I am really not seeing evidence or signs of infection at this time. Right now they have been using Santyl and then ?along with the Santyl a wet-to-dry dressing. No compressions been utilized. She did have arterial studies when she was in the hospital in ?November showed that she had an ABI on the  right of 0.78 on the left and 0.75 which is obviously not optimal. ?The patient does have peripheral vascular disease, diabetes mellitus type 2 though she tells me is diet controlled she takes no medicines and does ?not check her blood sugars. She also has a history of hypertension and chronic venous insufficiency along with lymphedema that I see today. ?03/17/2021 upon evaluation today patient appears to be doing okay in regard to her wound though still appears to be very sloughy. She is also ?having some increased pain and according to the home health nurse, nurse here in the clinic, the patient this appears to have some odor as well. ?Again I am not able to smell this but again that is more of a personal issue with my olfactory sense since COVID. Either way I think that right now ?the concern is this possibly is an infection we do need to address that. I do not want her to get worse by any means. We are still waiting on her to ?get scheduled for the arterial study which I think needs to be done ASAP. ?03/24/2020 upon evaluation today patient appears to be doing well with regard to her wound. She has been tolerating the dressing changes and ?with the new Levaquin antibiotic she is doing much better as well. Overall I am extremely pleased with where we stand currently. ?04/07/2021 upon evaluation patient's wound bed actually showed signs of some slight improvements. I am actually pleased in that regard. ?Fortunately I do not see any evidence of active infection locally or systemically which is great news and overall I think we are headed in the ?appropriate direction. The patient is pleased to hear this she does have an appointment with the vascular doctor on Wednesday to see what needs ?to be done to improve her arterial flow. ?04/14/2021 upon evaluation today patient appears to be doing  well with regard to her wound all things considered. She does need some sharp ?debridement to clear away necrotic debris. With that  being said I discussed that with her today and that is something that now that we know her ?arterial study is good we can definitely look into proceeding with at this point. Overall her TBI's were stated to be normal although she was ?noncompressible as far as ABIs were concerned this is great news. ?05/12/2021 upon evaluation today patient appears to be doing well with regard to her wound. She has been tolerating the dressing changes ?without complication. Fortunately I do not see any signs of infection and I think the [facility] is doing quite well here. ?05/26/21 upon evaluation today patient appears to be doing well with regard to her wound. She is actually showing signs of improvement which is ?great news. Fortunately I do not see any evidence of active infection locally nor systemically which is great news. ?Electronic Signature(s) ?Signed: 05/26/2021 3:45:25 PM By: Worthy Keeler PA-C ?Previous Signature: 05/26/2021 3:45:05 PM Version By: Worthy Keeler PA-C ?Entered By: Worthy Keeler on 05/26/2021 15:45:25 ?Veronica Krueger, Veronica Krueger ([id number]) ?-------------------------------------------------------------------------------- ?Physical Exam Details ?Patient Name: Veronica Krueger, Veronica Krueger ?Date of Service: 05/26/2021 3:00 PM ?Medical Record Number: [id number] ?Patient Account Number: [id number] ?Date of Birth/Sex: May 23, 1955 (66 y.o. F) ?Treating RN: Levora Dredge ?Primary Care Provider: [facility] Other Clinician: ?Referring Provider: [facility] ?Treating Provider/Extender: Jeri Cos ?Weeks in Treatment: 12 ?Constitutional ?Well-nourished and well-hydrated in no acute distress. ?Respiratory ?normal breathing without difficulty. ?Psychiatric ?this patient is able to make decisions and demonstrates good insight into disease process. Alert and Oriented x 3. pleasant and cooperative. ?Notes ?Upon inspection patient's wound bed actually showed evidence of good granulation and epithelization at this point. Fortunately  I think that she is ?making good progress and overall I think that the edema control is better compared to where its been. Nonetheless posteriorly has some work to ?do as far as getting this healed. ?Electronic Signature(s) ?Signed: 05/26/2021 3:45:36 PM By: Worthy Keeler PA-C ?Entered By: Worthy Keeler on 05/26/2021 15:45:36 ?Veronica Krueger, Veronica Krueger ([id number]) ?-------------------------------------------------------------------------------- ?Physician Orders Details ?Patient Name: Veronica Krueger, Veronica Krueger ?Date of Service: 05/26/2021 3:00 PM ?Medical Record Number: [id number] ?Patient Account Number: [id number] ?Date of Birth/Sex: 04/09/1955 (66 y.o. 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 ?o Nurse Visit as needed ?Home Health ?[place] ?o [place] for wound care. May utilize formulary equivalent dressing for wound treatment orders unless ?otherwise specified. Home Health Nurse may visit [facility] to address patientos wound care needs. ?o **Please direct any NON-WOUND related issues/requests for orders to patient's Primary Care Physician. **If current ?dressing causes regression in wound condition, may D/C ordered dressing product/s and apply Normal Saline Moist ?Dressing daily until next Rea or Other MD appointment. **Notify [facility] of regression  in ?wound condition at [phone]. ?o Other Home Health Orders/Instructions: - if using hydrafera blue with the covering to back with wording on it, please cut slits into ?back  of hydrfera blue pri

## 2021-05-26 NOTE — Progress Notes (Signed)
SHONNA, DEITER ([id number]) ?Visit Report for 05/26/2021 ?Arrival Information Details ?Patient Name: Veronica Krueger, Veronica Krueger ?Date of Service: 05/26/2021 3:00 PM ?Medical Record Number: [id number] ?Patient Account Number: [id number] ?Date of Birth/Sex: 1955/11/22 (66 y.o. F) ?Treating RN: Levora Dredge ?Primary Care Taylynn Easton: [facility] Other Clinician: ?Referring Monterius Rolf: [facility] ?Treating Sevin Langenbach/Extender: Jeri Cos ?Weeks in Treatment: 12 ?Visit Information History Since Last Visit ?Added or deleted any medications: Yes ?Patient Arrived: Wheel Chair ?Any new allergies or adverse reactions: No ?Arrival Time: 15:11 ?Had a fall or experienced change in No ?Accompanied By: self ?activities of daily living that may affect ?Transfer Assistance: EasyPivot Patient Lift ?risk of falls: ?Patient Identification Verified: Yes ?Hospitalized since last visit: No ?Secondary Verification Process Completed: Yes ?Has Dressing in Place as Prescribed: Yes ?Patient Requires Transmission-Based No ?Pain Present Now: No ?Precautions: ?Patient Has Alerts: Yes ?Patient Alerts: diabetic type 2 ?04/10/31 ABI R 0.92 L ?1.04 ?Electronic Signature(s) ?Signed: 05/26/2021 4:40:06 PM By: Levora Dredge ?Entered By: Levora Dredge on 05/26/2021 15:12:23 ?RASHEIDA, BRODEN ([id number]) ?-------------------------------------------------------------------------------- ?Clinic Level of Care Assessment Details ?Patient Name: Veronica Krueger ?Date of Service: 05/26/2021 3:00 PM ?Medical Record Number: [id number] ?Patient Account Number: [id number] ?Date of Birth/Sex: 1955/06/12 (66 y.o. F) ?Treating RN: Levora Dredge ?Primary Care Piccola Arico: [facility] Other Clinician: ?Referring Latanya Hemmer: [facility] ?Treating Eymi Lipuma/Extender: Jeri Cos ?Weeks in Treatment: 12 ?Clinic Level of Care Assessment Items ?TOOL 1 Quantity Score ?'[]'$  - Use when EandM and Procedure is performed on INITIAL visit 0 ?ASSESSMENTS - Nursing Assessment /  Reassessment ?'[]'$  - General Physical Exam (combine w/ comprehensive assessment (listed just below) when performed on new ?0 ?pt. evals) ?'[]'$  - 0 ?Comprehensive Assessment (HX, ROS, Risk Assessments, Wounds Hx, etc.) ?ASSESSMENTS - Wound and Skin Assessment / Reassessment ?'[]'$  - Dermatologic / Skin Assessment (not related to wound area) 0 ?ASSESSMENTS - Ostomy and/or Continence Assessment and Care ?'[]'$  - Incontinence Assessment and Management 0 ?'[]'$  - 0 ?Ostomy Care Assessment and Management (repouching, etc.) ?PROCESS - Coordination of Care ?'[]'$  - Simple Patient / Family Education for ongoing care 0 ?'[]'$  - 0 ?Complex (extensive) Patient / Family Education for ongoing care ?'[]'$  - 0 ?Staff obtains Consents, Records, Test Results / Process Orders ?'[]'$  - 0 ?Staff telephones HHA, Nursing Homes / Clarify orders / etc ?'[]'$  - 0 ?Routine Transfer to another Facility (non-emergent condition) ?'[]'$  - 0 ?Routine Hospital Admission (non-emergent condition) ?'[]'$  - 0 ?New Admissions / Biomedical engineer / Ordering NPWT, Apligraf, etc. ?'[]'$  - 0 ?Emergency Hospital Admission (emergent condition) ?PROCESS - Special Needs ?'[]'$  - Pediatric / Minor Patient Management 0 ?'[]'$  - 0 ?Isolation Patient Management ?'[]'$  - 0 ?Hearing / Language / Visual special needs ?'[]'$  - 0 ?Assessment of Community assistance (transportation, D/C planning, etc.) ?'[]'$  - 0 ?Additional assistance / Altered mentation ?'[]'$  - 0 ?Support Surface(s) Assessment (bed, cushion, seat, etc.) ?INTERVENTIONS - Miscellaneous ?'[]'$  - External ear exam 0 ?'[]'$  - 0 ?Patient Transfer (multiple staff / Civil Service fast streamer / Similar devices) ?'[]'$  - 0 ?Simple Staple / Suture removal (25 or less) ?'[]'$  - 0 ?Complex Staple / Suture removal (26 or more) ?'[]'$  - 0 ?Hypo/Hyperglycemic Management (do not check if billed separately) ?'[]'$  - 0 ?Ankle / Brachial Index (ABI) - do not check if billed separately ?Has the patient been seen at the hospital within the last three years: Yes ?Total Score: 0 ?Level Of Care:  ____ ?KENASIA, SCHELLER ([id number]) ?Electronic Signature(s) ?Signed: 05/26/2021 4:40:06 PM By: Levora Dredge ?Entered By: Levora Dredge on 05/26/2021 15:31:33 ?TONNIE, STILLMAN ([id number]) ?-------------------------------------------------------------------------------- ?Compression  Therapy Details ?Patient Name: Veronica Krueger ?Date of Service: 05/26/2021 3:00 PM ?Medical Record Number: [id number] ?Patient Account Number: [id number] ?Date of Birth/Sex: 10-Feb-1956 (66 y.o. F) ?Treating RN: Levora Dredge ?Primary Care Camilia Caywood: [facility] Other Clinician: ?Referring Casimiro Lienhard: [facility] ?Treating Jamarco Zaldivar/Extender: Jeri Cos ?Weeks in Treatment: 12 ?Compression Therapy Performed for Wound Assessment: Wound #1 Left,Lateral Lower Leg ?Performed By: Clinician Levora Dredge, RN ?Compression Type: Four Layer ?Pre Treatment ABI: 1 ?Post Procedure Diagnosis ?Same as Pre-procedure ?Electronic Signature(s) ?Signed: 05/26/2021 4:40:06 PM By: Levora Dredge ?Entered By: Levora Dredge on 05/26/2021 15:28:51 ?LAWSYN, HEILER ([id number]) ?-------------------------------------------------------------------------------- ?Encounter Discharge Information Details ?Patient Name: Veronica Krueger ?Date of Service: 05/26/2021 3:00 PM ?Medical Record Number: [id number] ?Patient Account Number: [id number] ?Date of Birth/Sex: [birth year] (66 y.o. F) ?Treating RN: Levora Dredge ?Primary Care Angelee Bahr: [facility] Other Clinician: ?Referring Jesslynn Kruck: [facility] ?Treating Marrissa Dai/Extender: Jeri Cos ?Weeks in Treatment: 12 ?Encounter Discharge Information Items ?Discharge Condition: Stable ?Ambulatory Status: Wheelchair ?Discharge Destination: Home ?Transportation: Private Auto ?Accompanied By: self/ friend ?Schedule Follow-up Appointment: Yes ?Clinical Summary of Care: ?Electronic Signature(s) ?Signed: 05/26/2021 4:40:06 PM By: Levora Dredge ?Entered By: Levora Dredge on 05/26/2021 15:55:54 ?SHAQUELA, WEICHERT ([id number]) ?-------------------------------------------------------------------------------- ?Lower Extremity Assessment Details ?Patient Name: AYDAN, PHOENIX ?Date of Service: 05/26/2021 3:00 PM ?Medical Record Number: [id number] ?Patient Account Number: [id number] ?Date of Birth/Sex: 1955-07-19 (66 y.o. F) ?Treating RN: Levora Dredge ?Primary Care Sina Lucchesi: [facility] Other Clinician: ?Referring Treacy Holcomb: [facility] ?Treating Latoyna Hird/Extender: Jeri Cos ?Weeks in Treatment: 12 ?Edema Assessment ?Assessed: [Left: No] [Right: No] ?Edema: [Left: Ye] [Right: s] ?Calf ?Left: Right: ?Point of Measurement: 32 cm From Medial Instep 56.5 cm ?Ankle ?Left: Right: ?Point of Measurement: 9 cm From Medial Instep 38.5 cm ?Vascular Assessment ?Pulses: ?Dorsalis Pedis ?Palpable: [Left:Yes] ?Electronic Signature(s) ?Signed: 05/26/2021 4:40:06 PM By: Levora Dredge ?Entered By: Levora Dredge on 05/26/2021 15:24:12 ?BRISTOL, OSENTOSKI ([id number]) ?-------------------------------------------------------------------------------- ?Multi Wound Chart Details ?Patient Name: KETINA, MARS ?Date of Service: 05/26/2021 3:00 PM ?Medical Record Number: [id number] ?Patient Account Number: [id number] ?Date of Birth/Sex: 01-Feb-1956 (66 y.o. F) ?Treating RN: Levora Dredge ?Primary Care Shevaun Lovan: [facility] Other Clinician: ?Referring Leroy Pettway: [facility] ?Treating Ghazal Pevey/Extender: Jeri Cos ?Weeks in Treatment: 12 ?Vital Signs ?Height(in): 69 ?Pulse(bpm): 59 ?Weight(lbs): ?Blood Pressure(mmHg): 180/71 ?Body Mass Index(BMI): ?Temperature(??F): 98.2 ?Respiratory Rate(breaths/min): 18 ?Photos: [N/A:N/A] ?Wound Location: Left, Lateral Lower Leg N/A N/A ?Wounding Event: Blister N/A N/A ?Primary Etiology: Diabetic Wound/Ulcer of the Lower N/A N/A ?Extremity ?Comorbid History: Glaucoma, Anemia, Lymphedema, N/A N/A ?Hypertension, Type II Diabetes, ?Gout ?Date Acquired: 12/04/2020 N/A N/A ?Weeks of Treatment: 12 N/A  N/A ?Wound Status: Open N/A N/A ?Wound Recurrence: No N/A N/A ?Measurements L x W x D (cm) 4.5x5x0.2 N/A N/A ?Area (cm?) : 17.671 N/A N/A ?Volume (cm?) : 3.534 N/A N/A ?% Reduction in Area: 17.50% N/A N/A ?% Reduction in Volume:

## 2021-05-28 DIAGNOSIS — R69 Illness, unspecified: Secondary | ICD-10-CM | POA: Diagnosis not present

## 2021-05-28 DIAGNOSIS — I890 Lymphedema, not elsewhere classified: Secondary | ICD-10-CM | POA: Diagnosis not present

## 2021-05-28 DIAGNOSIS — R9431 Abnormal electrocardiogram [ECG] [EKG]: Secondary | ICD-10-CM | POA: Diagnosis not present

## 2021-05-28 DIAGNOSIS — I10 Essential (primary) hypertension: Secondary | ICD-10-CM | POA: Diagnosis not present

## 2021-05-28 DIAGNOSIS — E118 Type 2 diabetes mellitus with unspecified complications: Secondary | ICD-10-CM | POA: Diagnosis not present

## 2021-05-28 DIAGNOSIS — I499 Cardiac arrhythmia, unspecified: Secondary | ICD-10-CM | POA: Diagnosis not present

## 2021-06-05 DIAGNOSIS — E669 Obesity, unspecified: Secondary | ICD-10-CM | POA: Diagnosis not present

## 2021-06-05 DIAGNOSIS — E119 Type 2 diabetes mellitus without complications: Secondary | ICD-10-CM | POA: Diagnosis not present

## 2021-06-05 DIAGNOSIS — R9431 Abnormal electrocardiogram [ECG] [EKG]: Secondary | ICD-10-CM | POA: Diagnosis not present

## 2021-06-05 DIAGNOSIS — E782 Mixed hyperlipidemia: Secondary | ICD-10-CM | POA: Diagnosis not present

## 2021-06-05 DIAGNOSIS — I10 Essential (primary) hypertension: Secondary | ICD-10-CM | POA: Diagnosis not present

## 2021-06-09 ENCOUNTER — Encounter: Payer: Medicare HMO | Admitting: Physician Assistant

## 2021-06-09 DIAGNOSIS — I7389 Other specified peripheral vascular diseases: Secondary | ICD-10-CM | POA: Diagnosis not present

## 2021-06-09 DIAGNOSIS — I10 Essential (primary) hypertension: Secondary | ICD-10-CM | POA: Diagnosis not present

## 2021-06-09 DIAGNOSIS — I872 Venous insufficiency (chronic) (peripheral): Secondary | ICD-10-CM | POA: Diagnosis not present

## 2021-06-09 DIAGNOSIS — E11622 Type 2 diabetes mellitus with other skin ulcer: Secondary | ICD-10-CM | POA: Diagnosis not present

## 2021-06-09 DIAGNOSIS — I890 Lymphedema, not elsewhere classified: Secondary | ICD-10-CM | POA: Diagnosis not present

## 2021-06-09 DIAGNOSIS — E1151 Type 2 diabetes mellitus with diabetic peripheral angiopathy without gangrene: Secondary | ICD-10-CM | POA: Diagnosis not present

## 2021-06-09 DIAGNOSIS — L97822 Non-pressure chronic ulcer of other part of left lower leg with fat layer exposed: Secondary | ICD-10-CM | POA: Diagnosis not present

## 2021-06-09 NOTE — Progress Notes (Addendum)
Veronica, Krueger ([id number]) ?Visit Report for 06/09/2021 ?Arrival Information Details ?Patient Name: Veronica Krueger, Veronica Krueger ?Date of Service: 06/09/2021 3:15 PM ?Medical Record Number: [id number] ?Patient Account Number: [id number] ?Date of Birth/Sex: 1955/03/28 (66 y.o. F) ?Treating RN: Donnamarie Poag ?Primary Care Harun Brumley: [facility] Other Clinician: ?Referring Matthewjames Petrasek: [facility] ?Treating Amaar Oshita/Extender: Jeri Cos ?Weeks in Treatment: 14 ?Visit Information History Since Last Visit ?Added or deleted any medications: No ?Patient Arrived: Wheel Chair ?Had a fall or experienced change in No ?Arrival Time: 15:18 ?activities of daily living that may affect ?Accompanied By: self ?risk of falls: ?Transfer Assistance: Manual ?Hospitalized since last visit: No ?Patient Identification Verified: Yes ?Has Dressing in Place as Prescribed: Yes ?Patient Requires Transmission-Based No ?Has Compression in Place as Prescribed: Yes ?Precautions: ?Pain Present Now: No ?Patient Has Alerts: Yes ?Patient Alerts: diabetic type 2 ?04/10/31 ABI R 0.92 L ?1.04 ?Electronic Signature(s) ?Signed: 06/09/2021 4:39:42 PM By: Donnamarie Poag ?Entered ByDonnamarie Poag on 06/09/2021 15:28:49 ?MAEBELL, LYVERS ([id number]) ?-------------------------------------------------------------------------------- ?Compression Therapy Details ?Patient Name: Veronica, Krueger ?Date of Service: 06/09/2021 3:15 PM ?Medical Record Number: [id number] ?Patient Account Number: [id number] ?Date of Birth/Sex: [birth year] (66 y.o. F) ?Treating RN: Donnamarie Poag ?Primary Care Vittorio Mohs: [facility] Other Clinician: ?Referring Lateasha Breuer: [facility] ?Treating Tyshan Enderle/Extender: Jeri Cos ?Weeks in Treatment: 14 ?Compression Therapy Performed for Wound Assessment: Wound #1 Left,Lateral Lower Leg ?Performed By: Clinician Donnamarie Poag, RN ?Compression Type: Four Layer ?Post Procedure Diagnosis ?Same as Pre-procedure ?Electronic Signature(s) ?Signed: 06/09/2021 4:39:42 PM By:  Donnamarie Poag ?Entered ByDonnamarie Poag on 06/09/2021 15:56:47 ?ADELIE, CROSWELL ([id number]) ?-------------------------------------------------------------------------------- ?Encounter Discharge Information Details ?Patient Name: [name], Krueger ?Date of Service: 06/09/2021 3:15 PM ?Medical Record Number: [id number] ?Patient Account Number: [id number] ?Date of Birth/Sex: February 26, 1955 (66 y.o. F) ?Treating RN: Donnamarie Poag ?Primary Care Khia Dieterich: [facility] Other Clinician: ?Referring Vernel Donlan: [facility] ?Treating Jonasia Coiner/Extender: Jeri Cos ?Weeks in Treatment: 14 ?Encounter Discharge Information Items ?Discharge Condition: Stable ?Ambulatory Status: Wheelchair ?Discharge Destination: Home Health ?Telephoned: No ?Orders Sent: Yes ?Transportation: Private Auto ?Accompanied By: driver ?Schedule Follow-up Appointment: Yes ?Clinical Summary of Care: ?Electronic Signature(s) ?Signed: 06/09/2021 4:39:42 PM By: Donnamarie Poag ?Entered ByDonnamarie Poag on 06/09/2021 16:14:05 ?LATOSHIA, MONRROY ([id number]) ?-------------------------------------------------------------------------------- ?Lower Extremity Assessment Details ?Patient Name: Veronica, Krueger ?Date of Service: 06/09/2021 3:15 PM ?Medical Record Number: [id number] ?Patient Account Number: [id number] ?Date of Birth/Sex: [birth year] (66 y.o. F) ?Treating RN: Donnamarie Poag ?Primary Care Kary Sugrue: [facility] Other Clinician: ?Referring Audryanna Zurita: [facility] ?Treating Yvone Slape/Extender: Jeri Cos ?Weeks in Treatment: 14 ?Edema Assessment ?Assessed: [Left: Yes] [Right: No] ?Edema: [Left: Ye] [Right: s] ?Calf ?Left: Right: ?Point of Measurement: 32 cm From Medial Instep 53 cm ?Ankle ?Left: Right: ?Point of Measurement: 9 cm From Medial Instep 34.5 cm ?Electronic Signature(s) ?Signed: 06/09/2021 4:39:42 PM By: Donnamarie Poag ?Entered ByDonnamarie Poag on 06/09/2021 15:35:15 ?RHIANNAN, KIEVIT  ([id number]) ?-------------------------------------------------------------------------------- ?Multi Wound Chart Details ?Patient Name: Veronica, Krueger ?Date of Service: 06/09/2021 3:15 PM ?Medical Record Number: [id number] ?Patient Account Number: [id number] ?Date of Birth/Sex: 1955-04-26 (66 y.o. 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 ?Comorbid History: Glaucoma, Anemia, Lymphedema, N/A N/A ?Hypertension, Type II Diabetes, ?Gout ?Date Acquired: 12/04/2020 N/A N/A ?Weeks of Treatment: 14 N/A  N/A ?Wound Status: Open N/A N/A ?Wound Recurrence: No N/A N/A ?Measurements L x W x D (cm) 4.5x4.6x0.1 N/A N/A ?Area (cm?) : 16.258 N/A N/A ?Volume (cm?) : 1.626 N/A N/A ?% Reduction in Area: 24.10% N/A N/A ?% Reduction in Volume: 62.10% N/A N/A ?Starting Position 1 (o'clock): ?Ending Position 1 (o'clock): ?Undermining: Yes N/A N/A ?Classification: Grade 1 N/A N/A ?Exudate Amount: Large N/A N/A ?Exudate Type: Serosanguineous N/A N/A ?Exudate Color: red, brown N/A N/A ?Granulation Amount: Large (67-100%) N/A N/A ?Granulation Quality: Red, Hyper-granulation N/A N/A ?Necrotic Amount: Small (1-33%) N/A N/A ?Exposed Structures: ?Fat Layer (Subcutaneous Tissue): N/A N/A ?Yes ?Epithelialization: Small (1-33%) N/A N/A ?Treatment Notes ?Electronic Signature(s) ?Signed: 06/09/2021 4:39:42 PM By: Donnamarie Poag ?Entered ByDonnamarie Poag on 06/09/2021 15:35:41 ?FRANCHESCA, VENEZIANO  ([id number]) ?-------------------------------------------------------------------------------- ?Multi-Disciplinary Care Plan Details ?Patient Name: Veronica, Krueger ?Date of Service: 06/09/2021 3:15 PM ?Medical Record Number: [id number] ?Patient Account Number: [id number] ?Date of Birth/Sex: 1955-05-31 (66 y.o. F) ?Treating RN: Donnamarie Poag ?Primary Care Rees Matura: [facility] Other Clinician: ?Referring Allison Deshotels: [facility] ?Treating Tesia Lybrand/Extender: Jeri Cos ?Weeks in Treatment: 14 ?Active Inactive ?Wound/Skin Impairment ?Nursing Diagnoses: ?Impaired tissue integrity ?Knowledge deficit related to ulceration/compromised skin integrity ?Goals: ?Patient will have a decrease in wound volume by X% from date: (specify in notes) ?Date Initiated: 03/03/2021 ?Date Inactivated: 04/07/2021 ?Target Resolution Date: 03/31/2021 ?Goal Status: Met ?Patient/caregiver will verbalize understanding of skin care regimen ?Date Initiated: 03/03/2021 ?Date Inactivated: 05/12/2021 ?Target Resolution Date: 04/28/2021 ?Goal Status: Met ?Ulcer/skin breakdown will have a volume reduction of 30% by week 4 ?Date Initiated: 03/03/2021 ?Date Inactivated: 04/07/2021 ?Target Resolution Date: 04/28/2021 ?Goal Status: Unmet ?Unmet Reason: comorbities ?Ulcer/skin breakdown will have a volume reduction of 50% by week 8 ?Date Initiated: 03/03/2021 ?Target Resolution Date: 05/26/2021 ?Goal Status: Active ?Ulcer/skin breakdown will have a volume reduction of 80% by week 12 ?Date Initiated: 03/03/2021 ?Target Resolution Date: 06/23/2021 ?Goal Status: Active ?Ulcer/skin breakdown will heal within 14 weeks ?Date Initiated: 03/03/2021 ?Target Resolution Date: 07/07/2021 ?Goal Status: Active ?Interventions: ?Assess patient/caregiver ability to obtain necessary supplies ?Assess patient/caregiver ability to perform ulcer/skin care regimen upon admission and as needed ?Assess ulceration(s) every visit ?Provide education on ulcer and skin care ?Notes: ?Electronic  Signature(s) ?Signed: 06/09/2021 4:39:42 PM By: Donnamarie Poag ?Entered ByDonnamarie Poag on 06/09/2021 15:35:25 ?MIKAIYA, TRAMBLE ([id number]) ?-------------------------------------------------------------------------------- ?Non-Wound Condition Assessment Details ?Patient Name: WYNETTA, SEITH ?Date of Service: 06/09/2021 3:15 PM ?Medical Record Number: [id number] ?Patient Acc

## 2021-06-09 NOTE — Progress Notes (Addendum)
SHILA, KRUCZEK ([id number]) ?Visit Report for 06/09/2021 ?Chief Complaint Document Details ?Patient Name: Veronica Krueger, Veronica Krueger ?Date of Service: 06/09/2021 3:15 PM ?Medical Record Number: [id number] ?Patient Account Number: [id number] ?Date of Birth/Sex: 1955/08/22 (66 y.o. F) ?Treating RN: Donnamarie Poag ?Primary Care Provider: [facility] Other Clinician: ?Referring Provider: [facility] ?Treating Provider/Extender: Jeri Cos ?Weeks in Treatment: 14 ?Information Obtained from: Patient ?Chief Complaint ?Left LE Ulcer ?Electronic Signature(s) ?Signed: 06/09/2021 3:18:04 PM By: Worthy Keeler PA-C ?Entered By: Worthy Keeler on 06/09/2021 15:18:04 ?KIMBERLA, DRISKILL ([id number]) ?-------------------------------------------------------------------------------- ?HPI Details ?Patient Name: Veronica Krueger ?Date of Service: 06/09/2021 3:15 PM ?Medical Record Number: [id number] ?Patient Account Number: [id number] ?Date of Birth/Sex: 1955/11/23 (66 y.o. F) ?Treating RN: Donnamarie Poag ?Primary Care Provider: [facility] Other Clinician: ?Referring Provider: [facility] ?Treating Provider/Extender: Jeri Cos ?Weeks in Treatment: 14 ?History of Present Illness ?HPI Description: 03/03/2021 upon evaluation today patient appears to be doing somewhat poorly in regard to the wound on her left lateral lower ?extremity. She tells me this has been there since around October 2022. It came up as a blister and she is never had anything like this before. ?With that being said she currently is concerned about the fact that her home health nurse with [facility] was telling her that this could potentially ?be infected. With that being said I am really not seeing evidence or signs of infection at this time. Right now they have been using Santyl and then ?along with the Santyl a wet-to-dry dressing. No compressions been utilized. She did have arterial studies when she was in the hospital in ?November showed that she had an ABI on the  right of 0.78 on the left and 0.75 which is obviously not optimal. ?The patient does have peripheral vascular disease, diabetes mellitus type 2 though she tells me is diet controlled she takes no medicines and does ?not check her blood sugars. She also has a history of hypertension and chronic venous insufficiency along with lymphedema that I see today. ?03/17/2021 upon evaluation today patient appears to be doing okay in regard to her wound though still appears to be very sloughy. She is also ?having some increased pain and according to the home health nurse, nurse here in the clinic, the patient this appears to have some odor as well. ?Again I am not able to smell this but again that is more of a personal issue with my olfactory sense since COVID. Either way I think that right now ?the concern is this possibly is an infection we do need to address that. I do not want her to get worse by any means. We are still waiting on her to ?get scheduled for the arterial study which I think needs to be done ASAP. ?03/24/2020 upon evaluation today patient appears to be doing well with regard to her wound. She has been tolerating the dressing changes and ?with the new Levaquin antibiotic she is doing much better as well. Overall I am extremely pleased with where we stand currently. ?04/07/2021 upon evaluation patient's wound bed actually showed signs of some slight improvements. I am actually pleased in that regard. ?Fortunately I do not see any evidence of active infection locally or systemically which is great news and overall I think we are headed in the ?appropriate direction. The patient is pleased to hear this she does have an appointment with the vascular doctor on Wednesday to see what needs ?to be done to improve her arterial flow. ?04/14/2021 upon evaluation today patient appears to be doing  well with regard to her wound all things considered. She does need some sharp ?debridement to clear away necrotic debris. With that  being said I discussed that with her today and that is something that now that we know her ?arterial study is good we can definitely look into proceeding with at this point. Overall her TBI's were stated to be normal although she was ?noncompressible as far as ABIs were concerned this is great news. ?05/12/2021 upon evaluation today patient appears to be doing well with regard to her wound. She has been tolerating the dressing changes ?without complication. Fortunately I do not see any signs of infection and I think the [facility] is doing quite well here. ?05/26/21 upon evaluation today patient appears to be doing well with regard to her wound. She is actually showing signs of improvement which is ?great news. Fortunately I do not see any evidence of active infection locally nor systemically which is great news. ?06-09-2021 upon evaluation today patient fortunately seems to be doing quite well in regard to her wound. This is measuring a little bit smaller ?although it is definitely taking its time. Fortunately I do not see any signs of active infection locally nor systemically which is great news. No ?fevers, chills, nausea, vomiting, or diarrhea. ?Electronic Signature(s) ?Signed: 06/09/2021 4:03:33 PM By: Worthy Keeler PA-C ?Entered By: Worthy Keeler on 06/09/2021 16:03:32 ?EULONDA, ANDALON ([id number]) ?-------------------------------------------------------------------------------- ?Physical Exam Details ?Patient Name: Veronica Krueger ?Date of Service: 06/09/2021 3:15 PM ?Medical Record Number: [id number] ?Patient Account Number: [id number] ?Date of Birth/Sex: 09/26/1955 (66 y.o. F) ?Treating RN: Donnamarie Poag ?Primary Care Provider: [facility] Other Clinician: ?Referring Provider: [facility] ?Treating Provider/Extender: Jeri Cos ?Weeks in Treatment: 14 ?Constitutional ?Obese and well-hydrated in no acute distress. ?Respiratory ?normal breathing without difficulty. ?Psychiatric ?this patient is able  to make decisions and demonstrates good insight into disease process. Alert and Oriented x 3. pleasant and cooperative. ?Notes ?Patient's wound again is showing signs of excellent granulation and epithelization at this point. I do not see any evidence of infection locally nor ?systemically which is great my biggest thing right now which is considering the possibility of a skin substitute to try to speed things up obviously ?the faster we get this closed the better off she will be. The patient voiced understanding she is definitely up for checking into this. ?Electronic Signature(s) ?Signed: 06/09/2021 4:04:01 PM By: Worthy Keeler PA-C ?Entered By: Worthy Keeler on 06/09/2021 16:04:00 ?BRISEIS, AGUILERA ([id number]) ?-------------------------------------------------------------------------------- ?Physician Orders Details ?Patient Name: EYANNA, MCGONAGLE ?Date of Service: 06/09/2021 3:15 PM ?Medical Record Number: [id number] ?Patient Account Number: [id number] ?Date of Birth/Sex: 03-18-1955 (66 y.o. 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 - at request ?o Nurse Visit as needed ?Home Health ?[facility] ?o [place] for wound care. May utilize formulary equivalent dressing for wound treatment orders unless ?otherwise specified. Home Health Nurse may visit [facility] to address  patientos wound care needs. ?o **Please direct any NON-WOUND related issues/requests for orders to patient's  Primary Care Physician. **If current ?dressing causes regression in wound condition, may D/C or

## 2021-06-22 DIAGNOSIS — N1832 Chronic kidney disease, stage 3b: Secondary | ICD-10-CM | POA: Diagnosis not present

## 2021-06-22 DIAGNOSIS — E782 Mixed hyperlipidemia: Secondary | ICD-10-CM | POA: Diagnosis not present

## 2021-06-22 DIAGNOSIS — I129 Hypertensive chronic kidney disease with stage 1 through stage 4 chronic kidney disease, or unspecified chronic kidney disease: Secondary | ICD-10-CM | POA: Diagnosis not present

## 2021-06-23 ENCOUNTER — Encounter: Payer: Medicare HMO | Attending: Physician Assistant | Admitting: Physician Assistant

## 2021-06-23 DIAGNOSIS — E1151 Type 2 diabetes mellitus with diabetic peripheral angiopathy without gangrene: Secondary | ICD-10-CM | POA: Insufficient documentation

## 2021-06-23 DIAGNOSIS — L97822 Non-pressure chronic ulcer of other part of left lower leg with fat layer exposed: Secondary | ICD-10-CM | POA: Insufficient documentation

## 2021-06-23 DIAGNOSIS — E11622 Type 2 diabetes mellitus with other skin ulcer: Secondary | ICD-10-CM | POA: Insufficient documentation

## 2021-06-23 DIAGNOSIS — I890 Lymphedema, not elsewhere classified: Secondary | ICD-10-CM | POA: Diagnosis not present

## 2021-06-23 DIAGNOSIS — I10 Essential (primary) hypertension: Secondary | ICD-10-CM | POA: Insufficient documentation

## 2021-06-23 DIAGNOSIS — I7389 Other specified peripheral vascular diseases: Secondary | ICD-10-CM | POA: Diagnosis not present

## 2021-06-23 DIAGNOSIS — E119 Type 2 diabetes mellitus without complications: Secondary | ICD-10-CM | POA: Diagnosis not present

## 2021-06-23 NOTE — Progress Notes (Addendum)
JOLIENE, Veronica Krueger ([id number]) ?Visit Report for 06/23/2021 ?Chief Complaint Document Details ?Patient Name: Veronica Krueger, Veronica Krueger ?Date of Service: 06/23/2021 3:00 PM ?Medical Record Number: [id number] ?Patient Account Number: [id number] ?Date of Birth/Sex: 09-Jul-1955 (66 y.o. F) ?Treating RN: Levora Dredge ?Primary Care Provider: [facility] Other Clinician: ?Referring Provider: [facility] ?Treating Provider/Extender: Jeri Cos ?Weeks in Treatment: 16 ?Information Obtained from: Patient ?Chief Complaint ?Left LE Ulcer ?Electronic Signature(s) ?Signed: 06/23/2021 3:21:30 PM By: Worthy Keeler PA-C ?Entered By: Worthy Keeler on 06/23/2021 15:21:30 ?[name] ([id number]) ?-------------------------------------------------------------------------------- ?HPI Details ?Patient Name: [name], Veronica Krueger ?Date of Service: 06/23/2021 3:00 PM ?Medical Record Number: [id number] ?Patient Account Number: [id number] ?Date of Birth/Sex: 02-08-1956 (66 y.o. F) ?Treating RN: Levora Dredge ?Primary Care Provider: [facility] Other Clinician: ?Referring Provider: [facility] ?Treating Provider/Extender: Jeri Cos ?Weeks in Treatment: 16 ?History of Present Illness ?HPI Description: 03/03/2021 upon evaluation today patient appears to be doing somewhat poorly in regard to the wound on her left lateral lower ?extremity. She tells me this has been there since around October 2022. It came up as a blister and she is never had anything like this before. ?With that being said she currently is concerned about the fact that her home health nurse with [facility] was telling her that this could potentially ?be infected. With that being said I am really not seeing evidence or signs of infection at this time. Right now they have been using Santyl and then ?along with the Santyl a wet-to-dry dressing. No compressions been utilized. She did have arterial studies when she was in the hospital in ?November showed that she had an ABI on the  right of 0.78 on the left and 0.75 which is obviously not optimal. ?The patient does have peripheral vascular disease, diabetes mellitus type 2 though she tells me is diet controlled she takes no medicines and does ?not check her blood sugars. She also has a history of hypertension and chronic venous insufficiency along with lymphedema that I see today. ?03/17/2021 upon evaluation today patient appears to be doing okay in regard to her wound though still appears to be very sloughy. She is also ?having some increased pain and according to the home health nurse, nurse here in the clinic, the patient this appears to have some odor as well. ?Again I am not able to smell this but again that is more of a personal issue with my olfactory sense since COVID. Either way I think that right now ?the concern is this possibly is an infection we do need to address that. I do not want her to get worse by any means. We are still waiting on her to ?get scheduled for the arterial study which I think needs to be done ASAP. ?03/24/2020 upon evaluation today patient appears to be doing well with regard to her wound. She has been tolerating the dressing changes and ?with the new Levaquin antibiotic she is doing much better as well. Overall I am extremely pleased with where we stand currently. ?04/07/2021 upon evaluation patient's wound bed actually showed signs of some slight improvements. I am actually pleased in that regard. ?Fortunately I do not see any evidence of active infection locally or systemically which is great news and overall I think we are headed in the ?appropriate direction. The patient is pleased to hear this she does have an appointment with the vascular doctor on Wednesday to see what needs ?to be done to improve her arterial flow. ?04/14/2021 upon evaluation today patient appears to be doing  well with regard to her wound all things considered. She does need some sharp ?debridement to clear away necrotic debris. With that  being said I discussed that with her today and that is something that now that we know her ?arterial study is good we can definitely look into proceeding with at this point. Overall her TBI's were stated to be normal although she was ?noncompressible as far as ABIs were concerned this is great news. ?05/12/2021 upon evaluation today patient appears to be doing well with regard to her wound. She has been tolerating the dressing changes ?without complication. Fortunately I do not see any signs of infection and I think the [facility] is doing quite well here. ?05/26/21 upon evaluation today patient appears to be doing well with regard to her wound. She is actually showing signs of improvement which is ?great news. Fortunately I do not see any evidence of active infection locally nor systemically which is great news. ?06-09-2021 upon evaluation today patient fortunately seems to be doing quite well in regard to her wound. This is measuring a little bit smaller ?although it is definitely taking its time. Fortunately I do not see any signs of active infection locally nor systemically which is great news. No ?fevers, chills, nausea, vomiting, or diarrhea. ?Electronic Signature(s) ?Signed: 06/23/2021 4:32:02 PM By: Worthy Keeler PA-C ?Entered By: Worthy Keeler on 06/23/2021 16:32:02 ?DANYELL, SHADER ([id number]) ?-------------------------------------------------------------------------------- ?Physical Exam Details ?Patient Name: [name], Veronica Krueger ?Date of Service: 06/23/2021 3:00 PM ?Medical Record Number: [id number] ?Patient Account Number: [id number] ?Date of Birth/Sex: 1955/07/17 (66 y.o. F) ?Treating RN: Levora Dredge ?Primary Care Provider: [facility] Other Clinician: ?Referring Provider: [facility] ?Treating Provider/Extender: Jeri Cos ?Weeks in Treatment: 16 ?Constitutional ?Obese and well-hydrated in no acute distress. ?Respiratory ?normal breathing without difficulty. ?Psychiatric ?this patient is  able to make decisions and demonstrates good insight into disease process. Alert and Oriented x 3. pleasant and cooperative. ?Notes ?Does seem to be improving albeit very slowly. We did get approval for the Upon inspection patient's wound bed again Apligraf. With that being ?said she will have a [id number] co-pay per application that was discussed with her today. She is not sure that she wants to proceed with that she is ?going to think about it and let me know. Other than that I really feel like her only other option is to continue down the current path which is ?helping it is just very slow. I was hoping for something that would go little bit faster. ?Electronic Signature(s) ?Signed: 06/23/2021 4:32:35 PM By: Worthy Keeler PA-C ?Entered By: Worthy Keeler on 06/23/2021 16:32:35 ?ARZELLA, REHMANN ([id number]) ?-------------------------------------------------------------------------------- ?Physician Orders Details ?Patient Name: CARLYE, PANAMENO ?Date of Service: 06/23/2021 3:00 PM ?Medical Record Number: [id number] ?Patient Account Number: [id number] ?Date of Birth/Sex: 1955-12-11 (66 y.o. 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 - at request ?o Nurse Visit as needed ?Home Health ?[place] ?o [place] for wound care. May utilize formulary equivalent dressing for  wound treatment orders unless ?  otherwise specified. Home Health Nurse may visit PRN to address patientos wound care needs. ?o **Please direct any NON-WOUND related issues/requests for orders to patient

## 2021-06-23 NOTE — Progress Notes (Addendum)
Veronica, Krueger ([id number]) ?Visit Report for 06/23/2021 ?Arrival Information Details ?Patient Name: Veronica, Krueger ?Date of Service: 06/23/2021 3:00 PM ?Medical Record Number: [id number] ?Patient Account Number: [id number] ?Date of Birth/Sex: Sep 28, 1955 (66 y.o. F) ?Treating RN: Levora Dredge ?Primary Care Malayzia Laforte: [facility] Other Clinician: ?Referring Narely Nobles: [facility] ?Treating Celestia Duva/Extender: Jeri Cos ?Weeks in Treatment: 16 ?Visit Information History Since Last Visit ?Added or deleted any medications: No ?Patient Arrived: Wheel Chair ?Any new allergies or adverse reactions: No ?Arrival Time: 15:21 ?Had a fall or experienced change in No ?Accompanied By: self ?activities of daily living that may affect ?Transfer Assistance: EasyPivot Patient Lift ?risk of falls: ?Patient Identification Verified: Yes ?Hospitalized since last visit: No ?Secondary Verification Process Completed: Yes ?Has Dressing in Place as Prescribed: Yes ?Patient Requires Transmission-Based No ?Has Compression in Place as Prescribed: Yes ?Precautions: ?Pain Present Now: No ?Patient Has Alerts: Yes ?Patient Alerts: diabetic type 2 ?04/10/31 ABI R 0.92 L ?1.04 ?Electronic Signature(s) ?Signed: 06/23/2021 4:42:16 PM By: Levora Dredge ?Entered By: Levora Dredge on 06/23/2021 15:21:37 ?Veronica, Krueger ([id number]) ?-------------------------------------------------------------------------------- ?Clinic Level of Care Assessment Details ?Patient Name: Veronica, Krueger ?Date of Service: 06/23/2021 3:00 PM ?Medical Record Number: [id number] ?Patient Account Number: [id number] ?Date of Birth/Sex: 1955-03-01 (66 y.o. F) ?Treating RN: Levora Dredge ?Primary Care Jeffery Gammell: [facility] Other Clinician: ?Referring Leane Loring: [facility] ?Treating Sachi Boulay/Extender: Jeri Cos ?Weeks in Treatment: 16 ?Clinic Level of Care Assessment Items ?TOOL 1 Quantity Score ?'[]'$  - Use when EandM and Procedure is performed on INITIAL visit  0 ?ASSESSMENTS - Nursing Assessment / Reassessment ?'[]'$  - General Physical Exam (combine w/ comprehensive assessment (listed just below) when performed on new ?0 ?pt. evals) ?'[]'$  - 0 ?Comprehensive Assessment (HX, ROS, Risk Assessments, Wounds Hx, etc.) ?ASSESSMENTS - Wound and Skin Assessment / Reassessment ?'[]'$  - Dermatologic / Skin Assessment (not related to wound area) 0 ?ASSESSMENTS - Ostomy and/or Continence Assessment and Care ?'[]'$  - Incontinence Assessment and Management 0 ?'[]'$  - 0 ?Ostomy Care Assessment and Management (repouching, etc.) ?PROCESS - Coordination of Care ?'[]'$  - Simple Patient / Family Education for ongoing care 0 ?'[]'$  - 0 ?Complex (extensive) Patient / Family Education for ongoing care ?'[]'$  - 0 ?Staff obtains Consents, Records, Test Results / Process Orders ?'[]'$  - 0 ?Staff telephones HHA, Nursing Homes / Clarify orders / etc ?'[]'$  - 0 ?Routine Transfer to another Facility (non-emergent condition) ?'[]'$  - 0 ?Routine Hospital Admission (non-emergent condition) ?'[]'$  - 0 ?New Admissions / Biomedical engineer / Ordering NPWT, Apligraf, etc. ?'[]'$  - 0 ?Emergency Hospital Admission (emergent condition) ?PROCESS - Special Needs ?'[]'$  - Pediatric / Minor Patient Management 0 ?'[]'$  - 0 ?Isolation Patient Management ?'[]'$  - 0 ?Hearing / Language / Visual special needs ?'[]'$  - 0 ?Assessment of Community assistance (transportation, D/C planning, etc.) ?'[]'$  - 0 ?Additional assistance / Altered mentation ?'[]'$  - 0 ?Support Surface(s) Assessment (bed, cushion, seat, etc.) ?INTERVENTIONS - Miscellaneous ?'[]'$  - External ear exam 0 ?'[]'$  - 0 ?Patient Transfer (multiple staff / Civil Service fast streamer / Similar devices) ?'[]'$  - 0 ?Simple Staple / Suture removal (25 or less) ?'[]'$  - 0 ?Complex Staple / Suture removal (26 or more) ?'[]'$  - 0 ?Hypo/Hyperglycemic Management (do not check if billed separately) ?'[]'$  - 0 ?Ankle / Brachial Index (ABI) - do not check if billed separately ?Has the patient been seen at the hospital within the last three years:  Yes ?Total Score: 0 ?Level Of Care: ____ ?Veronica, Krueger ([id number]) ?Electronic Signature(s) ?Signed: 06/23/2021 4:42:16 PM By: Levora Dredge ?Entered By: Levora Dredge on  06/23/2021 16:40:06 ?Veronica, Krueger ([id number]) ?-------------------------------------------------------------------------------- ?Compression Therapy Details ?Patient Name: Veronica, Krueger ?Date of Service: 06/23/2021 3:00 PM ?Medical Record Number: [id number] ?Patient Account Number: [id number] ?Date of Birth/Sex: [birth year] (66 y.o. F) ?Treating RN: Levora Dredge ?Primary Care Faelyn Sigler: [facility] Other Clinician: ?Referring Suzie Vandam: [facility] ?Treating Arham Symmonds/Extender: Jeri Cos ?Weeks in Treatment: 16 ?Compression Therapy Performed for Wound Assessment: Wound #1 Left,Lateral Lower Leg ?Performed By: Clinician Levora Dredge, RN ?Compression Type: Four Layer ?Post Procedure Diagnosis ?Same as Pre-procedure ?Electronic Signature(s) ?Signed: 06/23/2021 4:39:38 PM By: Levora Dredge ?Entered By: Levora Dredge on 06/23/2021 16:39:38 ?Veronica, Krueger ([id number]) ?-------------------------------------------------------------------------------- ?Encounter Discharge Information Details ?Patient Name: Veronica, Krueger ?Date of Service: 06/23/2021 3:00 PM ?Medical Record Number: [id number] ?Patient Account Number: [id number] ?Date of Birth/Sex: [birth year] (66 y.o. F) ?Treating RN: Levora Dredge ?Primary Care Valera Vallas: [facility] Other Clinician: ?Referring Katria Botts: [facility] ?Treating Jaydis Duchene/Extender: Jeri Cos ?Weeks in Treatment: 16 ?Encounter Discharge Information Items ?Discharge Condition: Stable ?Ambulatory Status: Wheelchair ?Discharge Destination: Home ?Transportation: Other ?Accompanied By: self ?Schedule Follow-up Appointment: Yes ?Clinical Summary of Care: Patient Declined ?Electronic Signature(s) ?Signed: 06/23/2021 4:40:53 PM By: Levora Dredge ?Entered By: Levora Dredge on 06/23/2021  16:40:53 ?Veronica, Krueger ([id number]) ?-------------------------------------------------------------------------------- ?Lower Extremity Assessment Details ?Patient Name: Veronica, Krueger ?Date of Service: 06/23/2021 3:00 PM ?Medical Record Number: [id number] ?Patient Account Number: [id number] ?Date of Birth/Sex: [birth year] (66 y.o. F) ?Treating RN: Levora Dredge ?Primary Care Galo Sayed: [facility] Other Clinician: ?Referring Shyah Cadmus: [facility] ?Treating Rhaelyn Giron/Extender: Jeri Cos ?Weeks in Treatment: 16 ?Edema Assessment ?Assessed: [Left: No] [Right: No] ?Edema: [Left: Ye] [Right: s] ?Calf ?Left: Right: ?Point of Measurement: 32 cm From Medial Instep 56 cm ?Ankle ?Left: Right: ?Point of Measurement: 9 cm From Medial Instep 40.5 cm ?Electronic Signature(s) ?Signed: 06/23/2021 4:42:16 PM By: Levora Dredge ?Entered By: Levora Dredge on 06/23/2021 15:37:11 ?Veronica, Krueger ([id number]) ?-------------------------------------------------------------------------------- ?Multi Wound Chart Details ?Patient Name: Veronica, Krueger ?Date of Service: 06/23/2021 3:00 PM ?Medical Record Number: [id number] ?Patient Account Number: [id number] ?Date of Birth/Sex: 1955-09-10 (66 y.o. F) ?Treating RN: Levora Dredge ?Primary Care Brason Berthelot: [facility] Other Clinician: ?Referring Ruslan Mccabe: [facility] ?Treating Mahi Zabriskie/Extender: Jeri Cos ?Weeks in Treatment: 16 ?Vital Signs ?Height(in): 69 ?Pulse(bpm): 67 ?Weight(lbs): ?Blood Pressure(mmHg): 184/83 ?Body Mass Index(BMI): ?Temperature(??F): 97.5 ?Respiratory Rate(breaths/min): 18 ?Photos: [N/A:N/A] ?Wound Location: Left, Lateral Lower Leg N/A N/A ?Wounding Event: Blister N/A N/A ?Primary Etiology: Diabetic Wound/Ulcer of the Lower N/A N/A ?Extremity ?Comorbid History: Glaucoma, Anemia, Lymphedema, N/A N/A ?Hypertension, Type II Diabetes, ?Gout ?Date Acquired: 12/04/2020 N/A N/A ?Weeks of Treatment: 16 N/A N/A ?Wound Status: Open N/A N/A ?Wound Recurrence:  No N/A N/A ?Measurements L x W x D (cm) 5x4.1x1 N/A N/A ?Area (cm?) : 16.101 N/A N/A ?Volume (cm?) : 16.101 N/A N/A ?% Reduction in Area: 24.90% N/A N/A ?% Reduction in Volume: -275.80% N/A N/A ?Classification: Grade 1 N/A

## 2021-07-07 ENCOUNTER — Encounter: Payer: Medicare HMO | Admitting: Physician Assistant

## 2021-07-07 DIAGNOSIS — L97822 Non-pressure chronic ulcer of other part of left lower leg with fat layer exposed: Secondary | ICD-10-CM | POA: Diagnosis not present

## 2021-07-07 DIAGNOSIS — E11622 Type 2 diabetes mellitus with other skin ulcer: Secondary | ICD-10-CM | POA: Diagnosis not present

## 2021-07-07 DIAGNOSIS — I872 Venous insufficiency (chronic) (peripheral): Secondary | ICD-10-CM | POA: Diagnosis not present

## 2021-07-07 DIAGNOSIS — I10 Essential (primary) hypertension: Secondary | ICD-10-CM | POA: Diagnosis not present

## 2021-07-07 DIAGNOSIS — I890 Lymphedema, not elsewhere classified: Secondary | ICD-10-CM | POA: Diagnosis not present

## 2021-07-07 DIAGNOSIS — E1151 Type 2 diabetes mellitus with diabetic peripheral angiopathy without gangrene: Secondary | ICD-10-CM | POA: Diagnosis not present

## 2021-07-07 NOTE — Progress Notes (Addendum)
COSETTE, PRINDLE ([id number]) ?Visit Report for 07/07/2021 ?Chief Complaint Document Details ?Patient Name: Veronica Krueger, Veronica Krueger ?Date of Service: 07/07/2021 3:00 PM ?Medical Record Number: [id number] ?Patient Account Number: [id number] ?Date of Birth/Sex: 08/11/1955 (66 y.o. F) ?Treating RN: Levora Dredge ?Primary Care Provider: [facility] Other Clinician: ?Referring Provider: [facility] ?Treating Provider/Extender: Jeri Cos ?Weeks in Treatment: 18 ?Information Obtained from: Patient ?Chief Complaint ?Left LE Ulcer ?Electronic Signature(s) ?Signed: 07/07/2021 3:06:09 PM By: Worthy Keeler PA-C ?Entered By: Worthy Keeler on 07/07/2021 15:06:09 ?ANGELEAH, LABRAKE ([id number]) ?-------------------------------------------------------------------------------- ?HPI Details ?Patient Name: Veronica Krueger ?Date of Service: 07/07/2021 3:00 PM ?Medical Record Number: [id number] ?Patient Account Number: [id number] ?Date of Birth/Sex: [birth year] (66 y.o. F) ?Treating RN: Levora Dredge ?Primary Care Provider: [facility] Other Clinician: ?Referring Provider: [facility] ?Treating Provider/Extender: Jeri Cos ?Weeks in Treatment: 18 ?History of Present Illness ?HPI Description: 03/03/2021 upon evaluation today patient appears to be doing somewhat poorly in regard to the wound on her left lateral lower ?extremity. She tells me this has been there since around October 2022. It came up as a blister and she is never had anything like this before. ?With that being said she currently is concerned about the fact that her home health nurse with [facility] was telling her that this could potentially ?be infected. With that being said I am really not seeing evidence or signs of infection at this time. Right now they have been using Santyl and then ?along with the Santyl a wet-to-dry dressing. No compressions been utilized. She did have arterial studies when she was in the hospital in ?November showed that she had an ABI on  the right of 0.78 on the left and 0.75 which is obviously not optimal. ?The patient does have peripheral vascular disease, diabetes mellitus type 2 though she tells me is diet controlled she takes no medicines and does ?not check her blood sugars. She also has a history of hypertension and chronic venous insufficiency along with lymphedema that I see today. ?03/17/2021 upon evaluation today patient appears to be doing okay in regard to her wound though still appears to be very sloughy. She is also ?having some increased pain and according to the home health nurse, nurse here in the clinic, the patient this appears to have some odor as well. ?Again I am not able to smell this but again that is more of a personal issue with my olfactory sense since COVID. Either way I think that right now ?the concern is this possibly is an infection we do need to address that. I do not want her to get worse by any means. We are still waiting on her to ?get scheduled for the arterial study which I think needs to be done ASAP. ?03/24/2020 upon evaluation today patient appears to be doing well with regard to her wound. She has been tolerating the dressing changes and ?with the new Levaquin antibiotic she is doing much better as well. Overall I am extremely pleased with where we stand currently. ?04/07/2021 upon evaluation patient's wound bed actually showed signs of some slight improvements. I am actually pleased in that regard. ?Fortunately I do not see any evidence of active infection locally or systemically which is great news and overall I think we are headed in the ?appropriate direction. The patient is pleased to hear this she does have an appointment with the vascular doctor on Wednesday to see what needs ?to be done to improve her arterial flow. ?04/14/2021 upon evaluation today patient appears to be doing  well with regard to her wound all things considered. She does need some sharp ?debridement to clear away necrotic debris. With  that being said I discussed that with her today and that is something that now that we know her ?arterial study is good we can definitely look into proceeding with at this point. Overall her TBI's were stated to be normal although she was ?noncompressible as far as ABIs were concerned this is great news. ?05/12/2021 upon evaluation today patient appears to be doing well with regard to her wound. She has been tolerating the dressing changes ?without complication. Fortunately I do not see any signs of infection and I think the [facility] is doing quite well here. ?05/26/21 upon evaluation today patient appears to be doing well with regard to her wound. She is actually showing signs of improvement which is ?great news. Fortunately I do not see any evidence of active infection locally nor systemically which is great news. ?06-09-2021 upon evaluation today patient fortunately seems to be doing quite well in regard to her wound. This is measuring a little bit smaller ?although it is definitely taking its time. Fortunately I do not see any signs of active infection locally nor systemically which is great news. No ?fevers, chills, nausea, vomiting, or diarrhea. ?07-07-2021 upon evaluation today patient appears to be doing well with regard to her wound is looking better still slightly hypergranular but much ?more confluent granulation tissue that looks much healthier than where its been. Fortunately I do not see any signs of active infection at this time ?which is great news. No fevers, chills, nausea, vomiting, or diarrhea. ?Electronic Signature(s) ?Signed: 07/07/2021 3:37:29 PM By: Worthy Keeler PA-C ?Entered By: Worthy Keeler on 07/07/2021 15:37:29 ?[name] ([id number]) ?-------------------------------------------------------------------------------- ?Physical Exam Details ?Patient Name: Veronica, Krueger ?Date of Service: 07/07/2021 3:00 PM ?Medical Record Number: [id number] ?Patient Account Number:  [id number] ?Date of Birth/Sex: Jul 21, 1955 (66 y.o. F) ?Treating RN: Levora Dredge ?Primary Care Provider: [facility] Other Clinician: ?Referring Provider: [facility] ?Treating Provider/Extender: Jeri Cos ?Weeks in Treatment: 18 ?Constitutional ?Well-nourished and well-hydrated in no acute distress. ?Respiratory ?normal breathing without difficulty. ?Psychiatric ?this patient is able to make decisions and demonstrates good insight into disease process. Alert and Oriented x 3. pleasant and cooperative. ?Notes ?Upon inspection patient's wound bed showed signs of good granulation and epithelization at this point. Fortunately I do not see any evidence of ?infection at this time which is great news and overall I am extremely pleased with where we stand today. I do not see any need for sharp ?debridement which is great news. ?Electronic Signature(s) ?Signed: 07/07/2021 3:37:50 PM By: Worthy Keeler PA-C ?Entered By: Worthy Keeler on 07/07/2021 15:37:50 ?KAMEISHA, MALICKI ([id number]) ?-------------------------------------------------------------------------------- ?Physician Orders Details ?Patient Name: JANESHIA, CILIBERTO ?Date of Service: 07/07/2021 3:00 PM ?Medical Record Number: [id number] ?Patient Account Number: [id number] ?Date of Birth/Sex: November 01, 1955 (66 y.o. 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 - at request ?o Nurse Visit as needed ?Home Health ?[facility] ?o  [place] for wound care. May utilize formulary equivalent dressing for wound treatment orders unle

## 2021-07-07 NOTE — Progress Notes (Addendum)
MARCELLINA, JONSSON ([id number]) ?Visit Report for 07/07/2021 ?Arrival Information Details ?Patient Name: Veronica Krueger, Veronica Krueger ?Date of Service: 07/07/2021 3:00 PM ?Medical Record Number: [id number] ?Patient Account Number: [id number] ?Date of Birth/Sex: [birth year] (66 y.o. F) ?Treating RN: Levora Dredge ?Primary Care Atwell Mcdanel: [facility] Other Clinician: ?Referring Blease Capaldi: [facility] ?Treating Joycelin Radloff/Extender: Jeri Cos ?Weeks in Treatment: 18 ?Visit Information History Since Last Visit ?Added or deleted any medications: No ?Patient Arrived: Wheel Chair ?Any new allergies or adverse reactions: No ?Arrival Time: 15:08 ?Had a fall or experienced change in No ?Accompanied By: self ?activities of daily living that may affect ?Transfer Assistance: EasyPivot Patient Lift ?risk of falls: ?Patient Identification Verified: Yes ?Hospitalized since last visit: No ?Secondary Verification Process Completed: Yes ?Has Dressing in Place as Prescribed: Yes ?Patient Requires Transmission-Based No ?Has Compression in Place as Prescribed: Yes ?Precautions: ?Pain Present Now: Yes ?Patient Has Alerts: Yes ?Patient Alerts: diabetic type 2 ?04/10/31 ABI R 0.92 L ?1.04 ?Electronic Signature(s) ?Signed: 07/07/2021 4:33:56 PM By: Levora Dredge ?Entered By: Levora Dredge on 07/07/2021 15:08:47 ?RIOT, BARRICK ([id number]) ?-------------------------------------------------------------------------------- ?Clinic Level of Care Assessment Details ?Patient Name: Veronica Krueger, Veronica Krueger ?Date of Service: 07/07/2021 3:00 PM ?Medical Record Number: [id number] ?Patient Account Number: [id number] ?Date of Birth/Sex: 1955/06/03 (66 y.o. F) ?Treating RN: Levora Dredge ?Primary Care Jontay Maston: [facility] Other Clinician: ?Referring Gwynn Chalker: [facility] ?Treating Ruba Outen/Extender: Jeri Cos ?Weeks in Treatment: 18 ?Clinic Level of Care Assessment Items ?TOOL 1 Quantity Score ?'[]'$  - Use when EandM and Procedure is performed on INITIAL visit  0 ?ASSESSMENTS - Nursing Assessment / Reassessment ?'[]'$  - General Physical Exam (combine w/ comprehensive assessment (listed just below) when performed on new ?0 ?pt. evals) ?'[]'$  - 0 ?Comprehensive Assessment (HX, ROS, Risk Assessments, Wounds Hx, etc.) ?ASSESSMENTS - Wound and Skin Assessment / Reassessment ?'[]'$  - Dermatologic / Skin Assessment (not related to wound area) 0 ?ASSESSMENTS - Ostomy and/or Continence Assessment and Care ?'[]'$  - Incontinence Assessment and Management 0 ?'[]'$  - 0 ?Ostomy Care Assessment and Management (repouching, etc.) ?PROCESS - Coordination of Care ?'[]'$  - Simple Patient / Family Education for ongoing care 0 ?'[]'$  - 0 ?Complex (extensive) Patient / Family Education for ongoing care ?'[]'$  - 0 ?Staff obtains Consents, Records, Test Results / Process Orders ?'[]'$  - 0 ?Staff telephones HHA, Nursing Homes / Clarify orders / etc ?'[]'$  - 0 ?Routine Transfer to another Facility (non-emergent condition) ?'[]'$  - 0 ?Routine Hospital Admission (non-emergent condition) ?'[]'$  - 0 ?New Admissions / Biomedical engineer / Ordering NPWT, Apligraf, etc. ?'[]'$  - 0 ?Emergency Hospital Admission (emergent condition) ?PROCESS - Special Needs ?'[]'$  - Pediatric / Minor Patient Management 0 ?'[]'$  - 0 ?Isolation Patient Management ?'[]'$  - 0 ?Hearing / Language / Visual special needs ?'[]'$  - 0 ?Assessment of Community assistance (transportation, D/C planning, etc.) ?'[]'$  - 0 ?Additional assistance / Altered mentation ?'[]'$  - 0 ?Support Surface(s) Assessment (bed, cushion, seat, etc.) ?INTERVENTIONS - Miscellaneous ?'[]'$  - External ear exam 0 ?'[]'$  - 0 ?Patient Transfer (multiple staff / Civil Service fast streamer / Similar devices) ?'[]'$  - 0 ?Simple Staple / Suture removal (25 or less) ?'[]'$  - 0 ?Complex Staple / Suture removal (26 or more) ?'[]'$  - 0 ?Hypo/Hyperglycemic Management (do not check if billed separately) ?'[]'$  - 0 ?Ankle / Brachial Index (ABI) - do not check if billed separately ?Has the patient been seen at the hospital within the last three years:  Yes ?Total Score: 0 ?Level Of Care: ____ ?ASHONTE, ANGELUCCI ([id number]) ?Electronic Signature(s) ?Signed: 07/07/2021 4:33:56 PM By: Levora Dredge ?Entered By: Levora Dredge on  07/07/2021 15:44:32 ?MACIL, CRADY ([id number]) ?-------------------------------------------------------------------------------- ?Compression Therapy Details ?Patient Name: Veronica Krueger, Veronica Krueger ?Date of Service: 07/07/2021 3:00 PM ?Medical Record Number: [id number] ?Patient Account Number: [id number] ?Date of Birth/Sex: 1955/06/17 (66 y.o. F) ?Treating RN: Levora Dredge ?Primary Care Gerri Acre: [facility] Other Clinician: ?Referring Revis Whalin: [facility] ?Treating Jaylean Buenaventura/Extender: Jeri Cos ?Weeks in Treatment: 18 ?Compression Therapy Performed for Wound Assessment: Wound #1 Left,Lateral Lower Leg ?Performed By: Clinician Levora Dredge, RN ?Compression Type: Four Layer ?Post Procedure Diagnosis ?Same as Pre-procedure ?Electronic Signature(s) ?Signed: 07/07/2021 4:33:56 PM By: Levora Dredge ?Entered By: Levora Dredge on 07/07/2021 15:44:06 ?AASTHA, DAYLEY ([id number]) ?-------------------------------------------------------------------------------- ?Encounter Discharge Information Details ?Patient Name: Veronica Krueger, Veronica Krueger ?Date of Service: 07/07/2021 3:00 PM ?Medical Record Number: [id number] ?Patient Account Number: [id number] ?Date of Birth/Sex: Sep 01, 1955 (66 y.o. F) ?Treating RN: Levora Dredge ?Primary Care Seaver Machia: [facility] Other Clinician: ?Referring Lawton Dollinger: [facility] ?Treating Lonnell Chaput/Extender: Jeri Cos ?Weeks in Treatment: 18 ?Encounter Discharge Information Items ?Discharge Condition: Stable ?Ambulatory Status: Wheelchair ?Discharge Destination: Home ?Transportation: Other ?Accompanied By: self ?Schedule Follow-up Appointment: Yes ?Clinical Summary of Care: Patient Declined ?Electronic Signature(s) ?Signed: 07/07/2021 4:33:56 PM By: Levora Dredge ?Entered By: Levora Dredge on 07/07/2021  15:45:38 ?KENNAH, HEHR ([id number]) ?-------------------------------------------------------------------------------- ?Lower Extremity Assessment Details ?Patient Name: Veronica Krueger, Veronica Krueger ?Date of Service: 07/07/2021 3:00 PM ?Medical Record Number: [id number] ?Patient Account Number: [id number] ?Date of Birth/Sex: Sep 05, 1955 (66 y.o. F) ?Treating RN: Levora Dredge ?Primary Care Abra Lingenfelter: [facility] Other Clinician: ?Referring Siddhi Dornbush: [facility] ?Treating Delona Clasby/Extender: Jeri Cos ?Weeks in Treatment: 18 ?Edema Assessment ?Assessed: [Left: No] [Right: No] ?Edema: [Left: Ye] [Right: s] ?Calf ?Left: Right: ?Point of Measurement: 32 cm From Medial Instep 56 cm ?Ankle ?Left: Right: ?Point of Measurement: 9 cm From Medial Instep 40.6 cm ?Vascular Assessment ?Pulses: ?Dorsalis Pedis ?Palpable: [Left:Yes] ?Electronic Signature(s) ?Signed: 07/07/2021 4:33:56 PM By: Levora Dredge ?Entered By: Levora Dredge on 07/07/2021 15:22:02 ?NIOMA, MCCUBBINS ([id number]) ?-------------------------------------------------------------------------------- ?Multi Wound Chart Details ?Patient Name: Veronica Krueger, Veronica Krueger ?Date of Service: 07/07/2021 3:00 PM ?Medical Record Number: [id number] ?Patient Account Number: [id number] ?Date of Birth/Sex: [birth year] (66 y.o. F) ?Treating RN: Levora Dredge ?Primary Care Kayly Kriegel: [facility] Other Clinician: ?Referring Karlisha Mathena: [facility] ?Treating Lalia Loudon/Extender: Jeri Cos ?Weeks in Treatment: 18 ?Vital Signs ?Height(in): 69 ?Pulse(bpm): 65 ?Weight(lbs): ?Blood Pressure(mmHg): 160/64 ?Body Mass Index(BMI): ?Temperature(??F): 97.7 ?Respiratory Rate(breaths/min): 18 ?Photos: [N/A:N/A] ?Wound Location: Left, Lateral Lower Leg N/A N/A ?Wounding Event: Blister N/A N/A ?Primary Etiology: Diabetic Wound/Ulcer of the Lower N/A N/A ?Extremity ?Comorbid History: Glaucoma, Anemia, Lymphedema, N/A N/A ?Hypertension, Type II Diabetes, ?Gout ?Date Acquired: 12/04/2020 N/A N/A ?Weeks of  Treatment: 18 N/A N/A ?Wound Status: Open N/A N/A ?Wound Recurrence: No N/A N/A ?Measurements L x W x D (cm) 5x5x0.2 N/A N/A ?Area (cm?) : 19.635 N/A N/A ?Volume (cm?) : 3.927 N/A N/A ?% Reduction in Area: 8.40%

## 2021-07-28 ENCOUNTER — Encounter: Payer: Medicare HMO | Attending: Physician Assistant | Admitting: Physician Assistant

## 2021-07-28 DIAGNOSIS — I872 Venous insufficiency (chronic) (peripheral): Secondary | ICD-10-CM | POA: Insufficient documentation

## 2021-07-28 DIAGNOSIS — I890 Lymphedema, not elsewhere classified: Secondary | ICD-10-CM | POA: Insufficient documentation

## 2021-07-28 DIAGNOSIS — E1151 Type 2 diabetes mellitus with diabetic peripheral angiopathy without gangrene: Secondary | ICD-10-CM | POA: Diagnosis not present

## 2021-07-28 DIAGNOSIS — L97822 Non-pressure chronic ulcer of other part of left lower leg with fat layer exposed: Secondary | ICD-10-CM | POA: Diagnosis not present

## 2021-07-28 DIAGNOSIS — E11622 Type 2 diabetes mellitus with other skin ulcer: Secondary | ICD-10-CM | POA: Insufficient documentation

## 2021-07-28 DIAGNOSIS — I10 Essential (primary) hypertension: Secondary | ICD-10-CM | POA: Diagnosis not present

## 2021-07-28 NOTE — Progress Notes (Signed)
Veronica, Krueger ([id number]) Visit Report for 07/28/2021 Chief Complaint Document Details Patient Name: Veronica Krueger, Veronica Krueger Date of Service: 07/28/2021 3:00 PM Medical Record Number: [id number] Patient Account Number: [id number] Date of Birth/Sex: Jan 27, 1956 (66 y.o. F) Treating RN: Primary Care Provider: Georgian Co Other Clinician: Referring Provider: Georgian Co Treating Provider/Extender: Skipper Cliche in Treatment: 21 Information Obtained from: Patient Chief Complaint Left LE Ulcer Electronic Signature(s) Signed: 07/28/2021 3:22:01 PM By: Worthy Keeler PA-C Entered By: Worthy Keeler on 07/28/2021 15:22:01 Veronica Krueger ([id number]) -------------------------------------------------------------------------------- HPI Details Patient Name: Veronica Krueger Date of Service: 07/28/2021 3:00 PM Medical Record Number: [id number] Patient Account Number: [id number] Date of Birth/Sex: 08-11-1955 (66 y.o. F) Treating RN: Primary Care Provider: Georgian Co Other Clinician: Referring Provider: Georgian Co Treating Provider/Extender: Skipper Cliche in Treatment: 21 History of Present Illness HPI Description: 03/03/2021 upon evaluation today patient appears to be doing somewhat poorly in regard to the wound on her left lateral lower extremity. She tells me this has been there since around October 2022. It came up as a blister and she is never had anything like this before. With that being said she currently is concerned about the fact that her home health nurse with [facility] was telling her that this could potentially be infected. With that being said I am really not seeing evidence or signs of infection at this time. Right now they have been using Santyl and then along with the Santyl a wet-to-dry dressing. No compressions been utilized. She did have arterial studies when she was in the hospital in November showed that she had an ABI on the right of 0.78 on the left and  0.75 which is obviously not optimal. The patient does have peripheral vascular disease, diabetes mellitus type 2 though she tells me is diet controlled she takes no medicines and does not check her blood sugars. She also has a history of hypertension and chronic venous insufficiency along with lymphedema that I see today. 03/17/2021 upon evaluation today patient appears to be doing okay in regard to her wound though still appears to be very sloughy. She is also having some increased pain and according to the home health nurse, nurse here in the clinic, the patient this appears to have some odor as well. Again I am not able to smell this but again that is more of a personal issue with my olfactory sense since COVID. Either way I think that right now the concern is this possibly is an infection we do need to address that. I do not want her to get worse by any means. We are still waiting on her to get scheduled for the arterial study which I think needs to be done ASAP. 03/24/2020 upon evaluation today patient appears to be doing well with regard to her wound. She has been tolerating the dressing changes and with the new Levaquin antibiotic she is doing much better as well. Overall I am extremely pleased with where we stand currently. 04/07/2021 upon evaluation patient's wound bed actually showed signs of some slight improvements. I am actually pleased in that regard. Fortunately I do not see any evidence of active infection locally or systemically which is great news and overall I think we are headed in the appropriate direction. The patient is pleased to hear this she does have an appointment with the vascular doctor on Wednesday to see what needs to be done to improve her arterial flow. 04/14/2021 upon evaluation today patient appears to be doing well with regard to  her wound all things considered. She does need some sharp debridement to clear away necrotic debris. With that being said I discussed that  with her today and that is something that now that we know her arterial study is good we can definitely look into proceeding with at this point. Overall her TBI's were stated to be normal although she was noncompressible as far as ABIs were concerned this is great news. 05/12/2021 upon evaluation today patient appears to be doing well with regard to her wound. She has been tolerating the dressing changes without complication. Fortunately I do not see any signs of infection and I think the [facility] is doing quite well here. 05/26/21 upon evaluation today patient appears to be doing well with regard to her wound. She is actually showing signs of improvement which is great news. Fortunately I do not see any evidence of active infection locally nor systemically which is great news. 06-09-2021 upon evaluation today patient fortunately seems to be doing quite well in regard to her wound. This is measuring a little bit smaller although it is definitely taking its time. Fortunately I do not see any signs of active infection locally nor systemically which is great news. No fevers, chills, nausea, vomiting, or diarrhea. 07-07-2021 upon evaluation today patient appears to be doing well with regard to her wound is looking better still slightly hypergranular but much more confluent granulation tissue that looks much healthier than where its been. Fortunately I do not see any signs of active infection at this time which is great news. No fevers, chills, nausea, vomiting, or diarrhea. 07-28-2021 upon evaluation today patient's wound is actually showing signs of excellent improvement. Fortunately I do not see any evidence of infection locally or systemically which is great news and overall I think we are headed in the right direction here. Electronic Signature(s) Signed: 07/28/2021 4:10:49 PM By: Worthy Keeler PA-C Entered By: Worthy Keeler on 07/28/2021 16:10:48 Veronica Krueger, Veronica Krueger  ([id number]) -------------------------------------------------------------------------------- Physical Exam Details Patient Name: Veronica Krueger Date of Service: 07/28/2021 3:00 PM Medical Record Number: [id number] Patient Account Number: [id number] Date of Birth/Sex: Jul 02, 1955 (66 y.o. F) Treating RN: Primary Care Provider: Georgian Co Other Clinician: Referring Provider: Georgian Co Treating Provider/Extender: Skipper Cliche in Treatment: 77 Constitutional Well-nourished and well-hydrated in no acute distress. Respiratory normal breathing without difficulty. Psychiatric this patient is able to make decisions and demonstrates good insight into disease process. Alert and Oriented x 3. pleasant and cooperative. Notes Upon inspection patient's wound bed showed signs again of excellent granulation and epithelization at this point I think the [facility] is doing a great job and overall this is measuring smaller and looking like it is headed in the right direction there is really no need for sharp debridement today which is great news. Electronic Signature(s) Signed: 07/28/2021 4:11:07 PM By: Worthy Keeler PA-C Entered By: Worthy Keeler on 07/28/2021 16:11:07 Veronica Krueger ([id number]) -------------------------------------------------------------------------------- Physician Orders Details Patient Name: Veronica Krueger Date of Service: 07/28/2021 3:00 PM Medical Record Number: [id number] Patient Account Number: [id number] Date of Birth/Sex: 1955-04-01 (66 y.o. F) Treating RN: Primary Care Provider: Georgian Co Other Clinician: Referring Provider: Georgian Co Treating Provider/Extender: Skipper Cliche in Treatment: 21 Verbal / Phone Orders: No Diagnosis Coding ICD-10 Coding Code Description I89.0 Lymphedema, not elsewhere classified L97.822 Non-pressure chronic ulcer of other part of left lower leg with fat layer exposed I73.89 Other specified  peripheral vascular diseases I87.2 Venous insufficiency (chronic) (peripheral) I10 Essential (primary) hypertension E11.622  Type 2 diabetes mellitus with other skin ulcer Follow-up Appointments o Return Appointment in 2 weeks. - at request o Nurse Visit as needed [place] for wound care. May utilize formulary equivalent dressing for wound treatment orders unless otherwise specified. Home Health Nurse may visit [facility] to address patientos wound care needs. o **Please direct any NON-WOUND related issues/requests for orders to patient's Primary Care Physician. **If current dressing causes regression in wound condition, may D/C ordered dressing product/s and apply Normal Saline Moist Dressing daily until next [place] or Other MD appointment. **Notify [facility] of regression in wound condition at [phone]. o Other Home Health Orders/Instructions: - if using hydrafera blue with the covering to back with wording on it, please cut slits into back of hydrfera blue prior to apply to wound bed. Bathing/ Shower/ Hygiene o Wash wounds with antibacterial soap and water. o No tub bath. o Other: - We will do Monday dressing change and that week, HH will change Wednesday and Friday. On weeks patient is not being seen in wound clinic, [place] will need to change dressing and wrap Monday, Wednesday and Friday. Anesthetic (Use 'Patient Medications' Section for Anesthetic Order Entry) o Lidocaine applied to wound bed Cellular or Tissue Based Products o Cellular or Tissue Based Product Type: - Run for possible approval Edema Control - Lymphedema / Segmental Compressive Device / Other Bilateral Lower Extremities o Optional: One layer of unna paste to top of compression wrap (to act as an anchor). - place ABD pad on inner leg to help with wrap rolling and causing an indent o Elevate, Exercise Daily and  Avoid Standing for Long Periods of Time. o Elevate legs to the level of the heart and pump ankles as often as possible o Elevate leg(s) parallel to the floor when sitting. o DO YOUR BEST to sleep in the bed at night. DO NOT sleep in your recliner. Long hours of sitting in a recliner leads to swelling of the legs and/or potential wounds on your backside. Additional Orders / Instructions o Follow Nutritious Diet and Increase Protein Intake Wound Treatment Wound #1 - Lower Leg Wound Laterality: Left, Lateral Cleanser: Soap and Water 3 x Per Week/30 Days Discharge Instructions: Gently cleanse wound with antibacterial soap, rinse and pat dry prior to dressing wounds Cleanser: Wound Cleanser 3 x Per Week/30 Days Veronica Krueger, Veronica Krueger ([id number]) Discharge Instructions: Wash your hands with soap and water. Remove old dressing, discard into plastic bag and place into trash. Cleanse the wound with Wound Cleanser prior to applying a clean dressing using gauze sponges, not tissues or cotton balls. Do not scrub or use excessive force. Pat dry using gauze sponges, not tissue or cotton balls. Primary Dressing: Hydrofera Blue Ready Transfer Foam, 2.5x2.5 (in/in) 3 x Per Week/30 Days Discharge Instructions: Apply Hydrofera Blue Ready to wound bed as directed Secondary Dressing: ABD Pad 5x9 (in/in) 3 x Per Week/30 Days Discharge Instructions: Cover with ABD pad Placed on inside of leg under wrap Secondary Dressing: (NON-BORDER) Zetuvit Plus Silicone NON-BORDER 5x5 (in/in) 3 x Per Week/30 Days Compression Wrap: Medichoice 4 layer Compression System, 35-40 mmHG 3 x Per Week/30 Days Discharge Instructions: Apply multi-layer wrap as directed. Electronic Signature(s) Signed: 07/28/2021 4:19:54 PM By: Worthy Keeler PA-C Signed: 07/29/2021 4:20:44 PM By: Massie Kluver Entered By: Massie Kluver on 07/28/2021 15:45:01 Veronica Krueger  ([id number]) -------------------------------------------------------------------------------- Problem List Details Patient Name: Veronica Krueger Date of Service: 07/28/2021 3:00 PM  Medical Record Number: [id number] Patient Account Number: [id number] Date of Birth/Sex: Aug 02, 1955 (67 y.o. F) Treating RN: Primary Care Provider: Georgian Co Other Clinician: Referring Provider: Georgian Co Treating Provider/Extender: Skipper Cliche in Treatment: 21 Active Problems ICD-10 Encounter Code Description Active Date MDM Diagnosis I89.0 Lymphedema, not elsewhere classified 03/03/2021 No Yes L97.822 Non-pressure chronic ulcer of other part of left lower leg with fat layer 03/03/2021 No Yes exposed I73.89 Other specified peripheral vascular diseases 03/03/2021 No Yes I87.2 Venous insufficiency (chronic) (peripheral) 03/03/2021 No Yes I10 Essential (primary) hypertension 03/03/2021 No Yes E11.622 Type 2 diabetes mellitus with other skin ulcer 03/03/2021 No Yes Inactive Problems Resolved Problems Electronic Signature(s) Signed: 07/28/2021 3:21:59 PM By: Worthy Keeler PA-C Entered By: Worthy Keeler on 07/28/2021 15:21:58 Veronica Krueger ([id number]) -------------------------------------------------------------------------------- Progress Note Details Patient Name: Veronica Krueger Date of Service: 07/28/2021 3:00 PM Medical Record Number: [id number] Patient Account Number: [id number] Date of Birth/Sex: [birth year] (66 y.o. F) Treating RN: Primary Care Provider: Georgian Co Other Clinician: Referring Provider: Georgian Co Treating Provider/Extender: Skipper Cliche in Treatment: 21 Subjective Chief Complaint Information obtained from Patient Left LE Ulcer History of Present Illness (HPI) 03/03/2021 upon evaluation today patient appears to be doing somewhat poorly in regard to the wound on her left lateral lower extremity. She tells me this has been there since around October 2022. It  came up as a blister and she is never had anything like this before. With that being said she currently is concerned about the fact that her home health nurse with [facility] was telling her that this could potentially be infected. With that being said I am really not seeing evidence or signs of infection at this time. Right now they have been using Santyl and then along with the Santyl a wet-to-dry dressing. No compressions been utilized. She did have arterial studies when she was in the hospital in November showed that she had an ABI on the right of 0.78 on the left and 0.75 which is obviously not optimal. The patient does have peripheral vascular disease, diabetes mellitus type 2 though she tells me is diet controlled she takes no medicines and does not check her blood sugars. She also has a history of hypertension and chronic venous insufficiency along with lymphedema that I see today. 03/17/2021 upon evaluation today patient appears to be doing okay in regard to her wound though still appears to be very sloughy. She is also having some increased pain and according to the home health nurse, nurse here in the clinic, the patient this appears to have some odor as well. Again I am not able to smell this but again that is more of a personal issue with my olfactory sense since COVID. Either way I think that right now the concern is this possibly is an infection we do need to address that. I do not want her to get worse by any means. We are still waiting on her to get scheduled for the arterial study which I think needs to be done ASAP. 03/24/2020 upon evaluation today patient appears to be doing well with regard to her wound. She has been tolerating the dressing changes and with the new Levaquin antibiotic she is doing much better as well. Overall I am extremely pleased with where we stand currently. 04/07/2021 upon evaluation patient's wound bed actually showed signs of some slight improvements. I am  actually pleased in that regard. Fortunately I do not see any evidence of active infection locally or systemically which  is great news and overall I think we are headed in the appropriate direction. The patient is pleased to hear this she does have an appointment with the vascular doctor on Wednesday to see what needs to be done to improve her arterial flow. 04/14/2021 upon evaluation today patient appears to be doing well with regard to her wound all things considered. She does need some sharp debridement to clear away necrotic debris. With that being said I discussed that with her today and that is something that now that we know her arterial study is good we can definitely look into proceeding with at this point. Overall her TBI's were stated to be normal although she was noncompressible as far as ABIs were concerned this is great news. 05/12/2021 upon evaluation today patient appears to be doing well with regard to her wound. She has been tolerating the dressing changes without complication. Fortunately I do not see any signs of infection and I think the [facility] is doing quite well here. 05/26/21 upon evaluation today patient appears to be doing well with regard to her wound. She is actually showing signs of improvement which is great news. Fortunately I do not see any evidence of active infection locally nor systemically which is great news. 06-09-2021 upon evaluation today patient fortunately seems to be doing quite well in regard to her wound. This is measuring a little bit smaller although it is definitely taking its time. Fortunately I do not see any signs of active infection locally nor systemically which is great news. No fevers, chills, nausea, vomiting, or diarrhea. 07-07-2021 upon evaluation today patient appears to be doing well with regard to her wound is looking better still slightly hypergranular but much more confluent granulation tissue that looks much healthier than where its  been. Fortunately I do not see any signs of active infection at this time which is great news. No fevers, chills, nausea, vomiting, or diarrhea. 07-28-2021 upon evaluation today patient's wound is actually showing signs of excellent improvement. Fortunately I do not see any evidence of infection locally or systemically which is great news and overall I think we are headed in the right direction here. Objective Constitutional Well-nourished and well-hydrated in no acute distress. Veronica Krueger, Veronica Krueger ([id number]) Vitals Time Taken: 3:16 PM, Height: 69 in, Temperature: 98.3 F, Pulse: 64 bpm, Respiratory Rate: 18 breaths/min, Blood Pressure: 157/58 mmHg. Respiratory normal breathing without difficulty. Psychiatric this patient is able to make decisions and demonstrates good insight into disease process. Alert and Oriented x 3. pleasant and cooperative. General Notes: Upon inspection patient's wound bed showed signs again of excellent granulation and epithelization at this point I think the Hydrofera Blue is doing a great job and overall this is measuring smaller and looking like it is headed in the right direction there is really no need for sharp debridement today which is great news. Integumentary (Hair, Skin) Wound #1 status is Open. Original cause of wound was Blister. The date acquired was: 12/04/2020. The wound has been in treatment 21 weeks. The wound is located on the Left,Lateral Lower Leg. The wound measures 4.7cm length x 4.5cm width x 0.2cm depth; 16.611cm^2 area and 3.322cm^3 volume. There is Fat Layer (Subcutaneous Tissue) exposed. There is no tunneling or undermining noted. There is a medium amount of serosanguineous drainage noted. There is small (1-33%) red, hyper - granulation within the wound bed. There is a small (1-33%) amount of necrotic tissue within the wound bed including Adherent Slough. Assessment Active Problems ICD-10 Lymphedema, not  elsewhere classified Non-pressure  chronic ulcer of other part of left lower leg with fat layer exposed Other specified peripheral vascular diseases Venous insufficiency (chronic) (peripheral) Essential (primary) hypertension Type 2 diabetes mellitus with other skin ulcer Procedures Wound #1 Pre-procedure diagnosis of Wound #1 is a Diabetic Wound/Ulcer of the Lower Extremity located on the Left,Lateral Lower Leg . There was a Four Layer Compression Therapy Procedure with a pre-treatment ABI of 0.9 by Massie Kluver. Post procedure Diagnosis Wound #1: Same as Pre-Procedure Plan Follow-up Appointments: Return Appointment in 2 weeks. - at request Nurse Visit as needed Home Health: [place]: - [place] for wound care. May utilize formulary equivalent dressing for wound treatment orders unless otherwise specified. Home Health Nurse may visit [facility] to address patient s wound care needs. **Please direct any NON-WOUND related issues/requests for orders to patient's Primary Care Physician. **If current dressing causes regression in wound condition, may D/C ordered dressing product/s and apply Normal Saline Moist Dressing daily until next [place] or Other MD appointment. **Notify [facility] of regression in wound condition at [phone]. Other Home Health Orders/Instructions: - if using hydrafera blue with the covering to back with wording on it, please cut slits into back of hydrfera blue prior to apply to wound bed. Bathing/ Shower/ Hygiene: Wash wounds with antibacterial soap and water. No tub bath. Other: - We will do Monday dressing change and that week, HH will change Wednesday and Friday. On weeks patient is not being seen in wound clinic, [place] will need to change dressing and wrap Monday, Wednesday and Friday. Anesthetic (Use 'Patient Medications' Section for Anesthetic Order Entry): Veronica Krueger, Veronica Krueger ([id number]) Lidocaine applied to wound bed Cellular or Tissue Based  Products: Cellular or Tissue Based Product Type: - Run for possible approval Edema Control - Lymphedema / Segmental Compressive Device / Other: Optional: One layer of unna paste to top of compression wrap (to act as an anchor). - place ABD pad on inner leg to help with wrap rolling and causing an indent Elevate, Exercise Daily and Avoid Standing for Long Periods of Time. Elevate legs to the level of the heart and pump ankles as often as possible Elevate leg(s) parallel to the floor when sitting. DO YOUR BEST to sleep in the bed at night. DO NOT sleep in your recliner. Long hours of sitting in a recliner leads to swelling of the legs and/or potential wounds on your backside. Additional Orders / Instructions: Follow Nutritious Diet and Increase Protein Intake WOUND #1: - Lower Leg Wound Laterality: Left, Lateral Cleanser: Soap and Water 3 x Per Week/30 Days Discharge Instructions: Gently cleanse wound with antibacterial soap, rinse and pat dry prior to dressing wounds Cleanser: Wound Cleanser 3 x Per Week/30 Days Discharge Instructions: Wash your hands with soap and water. Remove old dressing, discard into plastic bag and place into trash. Cleanse the wound with Wound Cleanser prior to applying a clean dressing using gauze sponges, not tissues or cotton balls. Do not scrub or use excessive force. Pat dry using gauze sponges, not tissue or cotton balls. Primary Dressing: Hydrofera Blue Ready Transfer Foam, 2.5x2.5 (in/in) 3 x Per Week/30 Days Discharge Instructions: Apply Hydrofera Blue Ready to wound bed as directed Secondary Dressing: ABD Pad 5x9 (in/in) 3 x Per Week/30 Days Discharge Instructions: Cover with ABD pad Placed on inside of leg under wrap Secondary Dressing: (NON-BORDER) Zetuvit Plus Silicone NON-BORDER 5x5 (in/in) 3 x Per Week/30 Days Compression Wrap: Medichoice 4 layer Compression System,  35-40 mmHG 3 x Per Week/30 Days Discharge Instructions: Apply multi-layer wrap as  directed. 1. Amended suggest that we go ahead and continue with wound care measures as before this includes the [facility]. 2. I am also can recommend that we have the patient continue with an ABD pad to cover and a Zetuvit over top of this. 3. We will continue with a 4-layer compression wrap which I think is doing an awesome job. We will see patient back for reevaluation in 1 week here in the clinic. If anything worsens or changes patient will contact our office for additional recommendations. Electronic Signature(s) Signed: 07/28/2021 4:11:29 PM By: Worthy Keeler PA-C Entered By: Worthy Keeler on 07/28/2021 16:11:29 Veronica Krueger ([id number]) -------------------------------------------------------------------------------- SuperBill Details Patient Name: Veronica Krueger Date of Service: 07/28/2021 Medical Record Number: [id number] Patient Account Number: [id number] Date of Birth/Sex: [birth year] (66 y.o. F) Treating RN: Primary Care Provider: Georgian Co Other Clinician: Referring Provider: Georgian Co Treating Provider/Extender: Skipper Cliche in Treatment: 21 Diagnosis Coding ICD-10 Codes Code Description I89.0 Lymphedema, not elsewhere classified L97.822 Non-pressure chronic ulcer of other part of left lower leg with fat layer exposed I73.89 Other specified peripheral vascular diseases I87.2 Venous insufficiency (chronic) (peripheral) I10 Essential (primary) hypertension E11.622 Type 2 diabetes mellitus with other skin ulcer Facility Procedures CPT4 Code: 19509326 Description: (Facility Use Only) [phone] - [facility] LT LEG Modifier: Quantity: 1 Physician Procedures CPT4 Code: 9983382 Description: 50539 - WC PHYS LEVEL 3 - EST PT Modifier: Quantity: 1 CPT4 Code: Description: ICD-10 Diagnosis Description I89.0 Lymphedema, not elsewhere classified L97.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

## 2021-07-29 NOTE — Progress Notes (Signed)
Veronica Krueger, Veronica Krueger ([id number]) Visit Report for 07/28/2021 Arrival Information Details Patient Name: Veronica Krueger, Veronica Krueger Date of Service: 07/28/2021 3:00 PM Medical Record Number: [id number] Patient Account Number: [id number] Date of Birth/Sex: 03/06/1955 (66 y.o. F) Treating RN: Primary Care Dinh Ayotte: Georgian Co Other Clinician: Referring Mairany Bruno: Georgian Co Treating Yeila Morro/Extender: Skipper Cliche in Treatment: 21 Visit Information History Since Last Visit Any new allergies or adverse reactions: No Patient Arrived: Wheel Chair Had a fall or experienced change in No Arrival Time: 15:01 activities of daily living that may affect Transfer Assistance: EasyPivot Patient Lift risk of falls: Patient Requires Transmission-Based No Hospitalized since last visit: No Precautions: Pain Present Now: No Patient Has Alerts: Yes Patient Alerts: diabetic type 2 04/10/31 ABI R 0.92 L 1.04 Electronic Signature(s) Signed: 07/29/2021 4:20:44 PM By: Massie Kluver Entered By: Massie Kluver on 07/28/2021 15:16:10 Veronica Krueger ([id number]) -------------------------------------------------------------------------------- Compression Therapy Details Patient Name: Veronica Krueger Date of Service: 07/28/2021 3:00 PM Medical Record Number: [id number] Patient Account Number: [id number] Date of Birth/Sex: 1955-09-17 (66 y.o. F) Treating RN: Primary Care Carsyn Taubman: Georgian Co Other Clinician: Referring Anyeli Hockenbury: Georgian Co Treating Aissatou Fronczak/Extender: Jeri Cos Weeks in Treatment: 21 Compression Therapy Performed for Wound Assessment: Wound #1 Left,Lateral Lower Leg Performed By: Lenice Pressman, Angie, Compression Type: Four Layer Pre Treatment ABI: 0.9 Post Procedure Diagnosis Same as Pre-procedure Electronic Signature(s) Signed: 07/29/2021 4:20:44 PM By: Massie Kluver Entered By: Massie Kluver on 07/28/2021 15:44:03 Veronica Krueger  ([id number]) -------------------------------------------------------------------------------- Encounter Discharge Information Details Patient Name: Veronica Krueger Date of Service: 07/28/2021 3:00 PM Medical Record Number: [id number] Patient Account Number: [id number] Date of Birth/Sex: 02-14-1956 (66 y.o. F) Treating RN: Cornell Barman Primary Care Murrel Bertram: Georgian Co Other Clinician: Massie Kluver Referring Aleen Marston: Georgian Co Treating Joylynn Defrancesco/Extender: Skipper Cliche in Treatment: 21 Encounter Discharge Information Items Discharge Condition: Stable Ambulatory Status: Wheelchair Discharge Destination: Home Transportation: Private Auto Schedule Follow-up Appointment: Yes Clinical Summary of Care: Electronic Signature(s) Signed: 07/29/2021 4:20:44 PM By: Massie Kluver Entered By: Massie Kluver on 07/28/2021 16:14:16 Veronica Krueger ([id number]) -------------------------------------------------------------------------------- Lower Extremity Assessment Details Patient Name: Veronica Krueger Date of Service: 07/28/2021 3:00 PM Medical Record Number: [id number] Patient Account Number: [id number] Date of Birth/Sex: May 02, 1955 (66 y.o. F) Treating RN: Primary Care Juliauna Stueve: Georgian Co Other Clinician: Referring Lacy Taglieri: Georgian Co Treating Cedricka Sackrider/Extender: Jeri Cos Weeks in Treatment: 21 Edema Assessment Assessed: [Left: Yes] [Right: No] Edema: [Left: Ye] [Right: s] Calf Left: Right: Point of Measurement: 32 cm From Medial Instep 64 cm Ankle Left: Right: Point of Measurement: 9 cm From Medial Instep 37 cm Vascular Assessment Pulses: Dorsalis Pedis Palpable: [Left:Yes] Electronic Signature(s) Signed: 07/29/2021 4:20:44 PM By: Massie Kluver Entered By: Massie Kluver on 07/28/2021 15:33:26 Veronica Krueger ([id number]) -------------------------------------------------------------------------------- Multi Wound Chart Details Patient Name: Veronica Krueger Date of Service: 07/28/2021 3:00 PM Medical Record Number: [id number] Patient Account Number: [id number] Date of Birth/Sex: 12/21/1955 (66 y.o. F) Treating RN: Primary Care Karletta Millay: Georgian Co Other Clinician: Referring Lynell Greenhouse: Georgian Co Treating Ustin Cruickshank/Extender: Skipper Cliche in Treatment: 21 Vital Signs Height(in): 36 Pulse(bpm): 62 Weight(lbs): Blood Pressure(mmHg): 157/58 Body Mass Index(BMI): Temperature(F): 98.3 Respiratory Rate(breaths/min): 18 Photos: [N/A:N/A] Wound Location: Left, Lateral Lower Leg N/A N/A Wounding Event: Blister N/A N/A Primary Etiology: Diabetic Wound/Ulcer of the Lower N/A N/A Extremity Comorbid History: Glaucoma, Anemia, Lymphedema, N/A N/A Hypertension, Type II Diabetes, Gout Date Acquired: 12/04/2020 N/A N/A Weeks of Treatment: 21 N/A N/A Wound Status: Open N/A N/A Wound Recurrence: No N/A N/A Measurements L x W x D (cm) 4.7x4.5x0.2 N/A N/A Area (cm) :  16.611 N/A N/A Volume (cm) : 3.322 N/A N/A % Reduction in Area: 22.50% N/A N/A % Reduction in Volume: 22.50% N/A N/A Classification: Grade 1 N/A N/A Exudate Amount: Medium N/A N/A Exudate Type: Serosanguineous N/A N/A Exudate Color: red, brown N/A N/A Granulation Amount: Small (1-33%) N/A N/A Granulation Quality: Red, Hyper-granulation N/A N/A Necrotic Amount: Small (1-33%) N/A N/A Exposed Structures: Fat Layer (Subcutaneous Tissue): N/A N/A Yes Epithelialization: Medium (34-66%) N/A N/A Treatment Notes Electronic Signature(s) Signed: 07/29/2021 4:20:44 PM By: Massie Kluver Entered By: Massie Kluver on 07/28/2021 15:42:52 Veronica Krueger ([id number]) -------------------------------------------------------------------------------- Multi-Disciplinary Care Plan Details Patient Name: Veronica Krueger Date of Service: 07/28/2021 3:00 PM Medical Record Number: [id number] Patient Account Number: [id number] Date of Birth/Sex: Sep 17, 1955 (66 y.o. F) Treating  RN: Primary Care Matika Bartell: Georgian Co Other Clinician: Referring Orlin Kann: Georgian Co Treating Tinleigh Whitmire/Extender: Skipper Cliche in Treatment: 21 Active Inactive Wound/Skin Impairment Nursing Diagnoses: Impaired tissue integrity Knowledge deficit related to ulceration/compromised skin integrity Goals: Patient will have a decrease in wound volume by X% from date: (specify in notes) Date Initiated: 03/03/2021 Date Inactivated: 04/07/2021 Target Resolution Date: 03/31/2021 Goal Status: Met Patient/caregiver will verbalize understanding of skin care regimen Date Initiated: 03/03/2021 Date Inactivated: 05/12/2021 Target Resolution Date: 04/28/2021 Goal Status: Met Ulcer/skin breakdown will have a volume reduction of 30% by week 4 Date Initiated: 03/03/2021 Date Inactivated: 04/07/2021 Target Resolution Date: 04/28/2021 Goal Status: Unmet Unmet Reason: comorbities Ulcer/skin breakdown will have a volume reduction of 50% by week 8 Date Initiated: 03/03/2021 Target Resolution Date: 05/26/2021 Goal Status: Active Ulcer/skin breakdown will have a volume reduction of 80% by week 12 Date Initiated: 03/03/2021 Target Resolution Date: 06/23/2021 Goal Status: Active Ulcer/skin breakdown will heal within 14 weeks Date Initiated: 03/03/2021 Target Resolution Date: 07/07/2021 Goal Status: Active Interventions: Assess patient/caregiver ability to obtain necessary supplies Assess patient/caregiver ability to perform ulcer/skin care regimen upon admission and as needed Assess ulceration(s) every visit Provide education on ulcer and skin care Notes: Electronic Signature(s) Signed: 07/29/2021 4:20:44 PM By: Massie Kluver Entered By: Massie Kluver on 07/28/2021 15:42:42 Veronica Krueger ([id number]) -------------------------------------------------------------------------------- Pain Assessment Details Patient Name: Veronica Krueger Date of Service: 07/28/2021 3:00 PM Medical Record Number:  [id number] Patient Account Number: [id number] Date of Birth/Sex: 05-Sep-1955 (66 y.o. F) Treating RN: Primary Care Ethleen Lormand: Georgian Co Other Clinician: Referring Kainat Pizana: Georgian Co Treating Amara Manalang/Extender: Skipper Cliche in Treatment: 21 Active Problems Location of Pain Severity and Description of Pain Patient Has Paino No Site Locations Pain Management and Medication Current Pain Management: Electronic Signature(s) Signed: 07/29/2021 4:20:44 PM By: Massie Kluver Entered By: Massie Kluver on 07/28/2021 15:20:21 Veronica Krueger ([id number]) -------------------------------------------------------------------------------- Patient/Caregiver Education Details Patient Name: Veronica Krueger Date of Service: 07/28/2021 3:00 PM Medical Record Number: [id number] Patient Account Number: [id number] Date of Birth/Gender: [birth year] (66 y.o. F) Treating RN: Primary Care Physician: Georgian Co Other Clinician: Referring Physician: Georgian Co Treating Physician/Extender: Skipper Cliche in Treatment: 21 Education Assessment Education Provided To: Patient Education Topics Provided Wound/Skin Impairment: Handouts: Other: continue wound care as directed Methods: Explain/Verbal Responses: State content correctly Electronic Signature(s) Signed: 07/29/2021 4:20:44 PM By: Massie Kluver Entered By: Massie Kluver on 07/28/2021 15:46:22 Veronica Krueger ([id number]) -------------------------------------------------------------------------------- Wound Assessment Details Patient Name: Veronica Krueger Date of Service: 07/28/2021 3:00 PM Medical Record Number: [id number] Patient Account Number: [id number] Date of Birth/Sex: 1955-05-19 (66 y.o. F) Treating RN: Primary Care Rose Hegner: Georgian Co Other Clinician: Referring Myelle Poteat: Georgian Co Treating Mayar Whittier/Extender: Skipper Cliche in Treatment: 21 Wound Status Wound Number: 1 Primary Diabetic Wound/Ulcer  of  the Lower Extremity Etiology: Wound Location: Left, Lateral Lower Leg Wound Status: Open Wounding Event: Blister Comorbid Glaucoma, Anemia, Lymphedema, Hypertension, Type Date Acquired: 12/04/2020 History: II Diabetes, Gout Weeks Of Treatment: 21 Clustered Wound: No Photos Wound Measurements Length: (cm) 4.7 Width: (cm) 4.5 Depth: (cm) 0.2 Area: (cm) 16.611 Volume: (cm) 3.322 % Reduction in Area: 22.5% % Reduction in Volume: 22.5% Epithelialization: Medium (34-66%) Tunneling: No Undermining: No Wound Description Classification: Grade 1 Exudate Amount: Medium Exudate Type: Serosanguineous Exudate Color: red, brown Foul Odor After Cleansing: No Slough/Fibrino Yes Wound Bed Granulation Amount: Small (1-33%) Exposed Structure Granulation Quality: Red, Hyper-granulation Fat Layer (Subcutaneous Tissue) Exposed: Yes Necrotic Amount: Small (1-33%) Necrotic Quality: Adherent Slough Treatment Notes Wound #1 (Lower Leg) Wound Laterality: Left, Lateral Cleanser Soap and Water Discharge Instruction: Gently cleanse wound with antibacterial soap, rinse and pat dry prior to dressing wounds Wound Cleanser Discharge Instruction: Wash your hands with soap and water. Remove old dressing, discard into plastic bag and place into trash. Cleanse the wound with Wound Cleanser prior to applying a clean dressing using gauze sponges, not tissues or cotton balls. Do not scrub or use excessive force. Pat dry using gauze sponges, not tissue or cotton balls. Peri-Wound Care Veronica Krueger, Veronica Krueger ([id number]) Topical Primary Dressing Hydrofera Blue Ready Transfer Foam, 2.5x2.5 (in/in) Discharge Instruction: Apply Hydrofera Blue Ready to wound bed as directed Secondary Dressing ABD Pad 5x9 (in/in) Discharge Instruction: Cover with ABD pad Placed on inside of leg under wrap (NON-BORDER) Zetuvit Plus Silicone NON-BORDER 5x5 (in/in) Secured With Compression Wrap Medichoice 4 layer Compression  System, 35-40 mmHG Discharge Instruction: Apply multi-layer wrap as directed. Compression Stockings Add-Ons Electronic Signature(s) Signed: 07/29/2021 4:20:44 PM By: Massie Kluver Entered By: Massie Kluver on 07/28/2021 15:30:30 Veronica Krueger ([id number]) -------------------------------------------------------------------------------- Vitals Details Patient Name: Veronica Krueger Date of Service: 07/28/2021 3:00 PM Medical Record Number: [id number] Patient Account Number: [id number] Date of Birth/Sex: 25-Jan-1956 (66 y.o. F) Treating RN: Primary Care Tresa Jolley: Georgian Co Other Clinician: Referring Tunisha Ruland: Georgian Co Treating Webster Patrone/Extender: Skipper Cliche in Treatment: 21 Vital Signs Time Taken: 15:16 Temperature (F): 98.3 Height (in): 69 Pulse (bpm): 64 Respiratory Rate (breaths/min): 18 Blood Pressure (mmHg): 157/58 Reference Range: 80 - 120 mg / dl Electronic Signature(s) Signed: 07/29/2021 4:20:44 PM By: Massie Kluver Entered By: Massie Kluver on 07/28/2021 15:20:15

## 2021-08-11 ENCOUNTER — Encounter: Payer: Medicare HMO | Admitting: Physician Assistant

## 2021-08-11 DIAGNOSIS — L97822 Non-pressure chronic ulcer of other part of left lower leg with fat layer exposed: Secondary | ICD-10-CM | POA: Diagnosis not present

## 2021-08-11 DIAGNOSIS — E1151 Type 2 diabetes mellitus with diabetic peripheral angiopathy without gangrene: Secondary | ICD-10-CM | POA: Diagnosis not present

## 2021-08-11 DIAGNOSIS — I872 Venous insufficiency (chronic) (peripheral): Secondary | ICD-10-CM | POA: Diagnosis not present

## 2021-08-11 DIAGNOSIS — E11622 Type 2 diabetes mellitus with other skin ulcer: Secondary | ICD-10-CM | POA: Diagnosis not present

## 2021-08-11 DIAGNOSIS — I10 Essential (primary) hypertension: Secondary | ICD-10-CM | POA: Diagnosis not present

## 2021-08-11 DIAGNOSIS — I890 Lymphedema, not elsewhere classified: Secondary | ICD-10-CM | POA: Diagnosis not present

## 2021-08-11 NOTE — Progress Notes (Signed)
SALLE, BRANDLE ([id number]) Visit Report for 08/11/2021 Chief Complaint Document Details Patient Name: Veronica Krueger Date of Service: 08/11/2021 2:45 PM Medical Record Number: [id number] Patient Account Number: [id number] Date of Birth/Sex: 1955/08/01 (66 y.o. F) Treating RN: Carlene Coria Primary Care Provider: Georgian Co Other Clinician: Referring Provider: Georgian Co Treating Provider/Extender: Skipper Cliche in Treatment: 23 Information Obtained from: Patient Chief Complaint Left LE Ulcer Electronic Signature(s) Signed: 08/11/2021 2:59:22 PM By: Worthy Keeler PA-C Entered By: Worthy Keeler on 08/11/2021 14:59:21 Veronica Krueger ([id number]) -------------------------------------------------------------------------------- HPI Details Patient Name: Veronica Krueger Date of Service: 08/11/2021 2:45 PM Medical Record Number: [id number] Patient Account Number: [id number] Date of Birth/Sex: 11-09-1955 (66 y.o. F) Treating RN: Carlene Coria Primary Care Provider: Georgian Co Other Clinician: Referring Provider: Georgian Co Treating Provider/Extender: Skipper Cliche in Treatment: 23 History of Present Illness HPI Description: 03/03/2021 upon evaluation today patient appears to be doing somewhat poorly in regard to the wound on her left lateral lower extremity. She tells me this has been there since around October 2022. It came up as a blister and she is never had anything like this before. With that being said she currently is concerned about the fact that her home health nurse with [facility] was telling her that this could potentially be infected. With that being said I am really not seeing evidence or signs of infection at this time. Right now they have been using Santyl and then along with the Santyl a wet-to-dry dressing. No compressions been utilized. She did have arterial studies when she was in the hospital in November showed that she had an ABI on the  right of 0.78 on the left and 0.75 which is obviously not optimal. The patient does have peripheral vascular disease, diabetes mellitus type 2 though she tells me is diet controlled she takes no medicines and does not check her blood sugars. She also has a history of hypertension and chronic venous insufficiency along with lymphedema that I see today. 03/17/2021 upon evaluation today patient appears to be doing okay in regard to her wound though still appears to be very sloughy. She is also having some increased pain and according to the home health nurse, nurse here in the clinic, the patient this appears to have some odor as well. Again I am not able to smell this but again that is more of a personal issue with my olfactory sense since COVID. Either way I think that right now the concern is this possibly is an infection we do need to address that. I do not want her to get worse by any means. We are still waiting on her to get scheduled for the arterial study which I think needs to be done ASAP. 03/24/2020 upon evaluation today patient appears to be doing well with regard to her wound. She has been tolerating the dressing changes and with the new Levaquin antibiotic she is doing much better as well. Overall I am extremely pleased with where we stand currently. 04/07/2021 upon evaluation patient's wound bed actually showed signs of some slight improvements. I am actually pleased in that regard. Fortunately I do not see any evidence of active infection locally or systemically which is great news and overall I think we are headed in the appropriate direction. The patient is pleased to hear this she does have an appointment with the vascular doctor on Wednesday to see what needs to be done to improve her arterial flow. 04/14/2021 upon evaluation today patient appears to be doing  well with regard to her wound all things considered. She does need some sharp debridement to clear away necrotic debris. With that  being said I discussed that with her today and that is something that now that we know her arterial study is good we can definitely look into proceeding with at this point. Overall her TBI's were stated to be normal although she was noncompressible as far as ABIs were concerned this is great news. 05/12/2021 upon evaluation today patient appears to be doing well with regard to her wound. She has been tolerating the dressing changes without complication. Fortunately I do not see any signs of infection and I think the [facility] is doing quite well here. 05/26/21 upon evaluation today patient appears to be doing well with regard to her wound. She is actually showing signs of improvement which is great news. Fortunately I do not see any evidence of active infection locally nor systemically which is great news. 06-09-2021 upon evaluation today patient fortunately seems to be doing quite well in regard to her wound. This is measuring a little bit smaller although it is definitely taking its time. Fortunately I do not see any signs of active infection locally nor systemically which is great news. No fevers, chills, nausea, vomiting, or diarrhea. 07-07-2021 upon evaluation today patient appears to be doing well with regard to her wound is looking better still slightly hypergranular but much more confluent granulation tissue that looks much healthier than where its been. Fortunately I do not see any signs of active infection at this time which is great news. No fevers, chills, nausea, vomiting, or diarrhea. 07-28-2021 upon evaluation today patient's wound is actually showing signs of excellent improvement. Fortunately I do not see any evidence of infection locally or systemically which is great news and overall I think we are headed in the right direction here. 08-11-2021 upon evaluation today patient appears to be doing well with regard to her wound this is measuring a little bit small and is showing signs of  improvement. Fortunately there does not appear to be any signs of active infection locally or systemically which is great news. No fevers, chills, nausea, vomiting, or diarrhea. Electronic Signature(s) Signed: 08/11/2021 3:34:26 PM By: Worthy Keeler PA-C Entered By: Worthy Keeler on 08/11/2021 15:34:25 Veronica Krueger, Veronica Krueger ([id number]) -------------------------------------------------------------------------------- Physical Exam Details Patient Name: Veronica Krueger Date of Service: 08/11/2021 2:45 PM Medical Record Number: [id number] Patient Account Number: [id number] Date of Birth/Sex: 06-05-1955 (66 y.o. F) Treating RN: Carlene Coria Primary Care Provider: Georgian Co Other Clinician: Referring Provider: Georgian Co Treating Provider/Extender: Skipper Cliche in Treatment: 23 Constitutional Well-nourished and well-hydrated in no acute distress. Respiratory normal breathing without difficulty. Psychiatric this patient is able to make decisions and demonstrates good insight into disease process. Alert and Oriented x 3. pleasant and cooperative. Notes Patient's wound bed showed evidence of good granulation and epithelization at this point no sharp debridement was necessary which is great news and overall I am extremely pleased with where things stand today. Electronic Signature(s) Signed: 08/11/2021 3:34:38 PM By: Worthy Keeler PA-C Entered By: Worthy Keeler on 08/11/2021 15:34:38 Veronica Krueger ([id number]) -------------------------------------------------------------------------------- Physician Orders Details Patient Name: Veronica Krueger Date of Service: 08/11/2021 2:45 PM Medical Record Number: [id number] Patient Account Number: [id number] Date of Birth/Sex: [birth year] (66 y.o. F) Treating RN: Carlene Coria Primary Care Provider: Georgian Co Other Clinician: Referring Provider: Georgian Co Treating Provider/Extender: Skipper Cliche in Treatment:  23 Verbal / Phone Orders: No Diagnosis Coding  ICD-10 Coding Code Description I89.0 Lymphedema, not elsewhere classified L97.822 Non-pressure chronic ulcer of other part of left lower leg with fat layer exposed I73.89 Other specified peripheral vascular diseases I87.2 Venous insufficiency (chronic) (peripheral) I10 Essential (primary) hypertension E11.622 Type 2 diabetes mellitus with other skin ulcer Follow-up Appointments o Return Appointment in 2 weeks. - at request o Nurse Visit as needed [place] for wound care. May utilize formulary equivalent dressing for wound treatment orders unless otherwise specified. Home Health Nurse may visit [facility] to address patientos wound care needs. o **Please direct any NON-WOUND related issues/requests for orders to patient's Primary Care Physician. **If current dressing causes regression in wound condition, may D/C ordered dressing product/s and apply Normal Saline Moist Dressing daily until next [place] or Other MD appointment. **Notify [facility] of regression in wound condition at [phone]. o Other Home Health Orders/Instructions: - if using hydrafera blue with the covering to back with wording on it, please cut slits into back of hydrfera blue prior to apply to wound bed. Bathing/ Shower/ Hygiene o Wash wounds with antibacterial soap and water. o No tub bath. o Other: - We will do Monday dressing change and that week, HH will change Wednesday and Friday. On weeks patient is not being seen in wound clinic, [place] will need to change dressing and wrap Monday, Wednesday and Friday. Anesthetic (Use 'Patient Medications' Section for Anesthetic Order Entry) o Lidocaine applied to wound bed Edema Control - Lymphedema / Segmental Compressive Device / Other Bilateral Lower Extremities o Optional: One layer of unna paste to top of compression wrap (to  act as an anchor). - place ABD pad on inner leg to help with wrap rolling and causing an indent o Elevate, Exercise Daily and Avoid Standing for Long Periods of Time. o Elevate legs to the level of the heart and pump ankles as often as possible o Elevate leg(s) parallel to the floor when sitting. o DO YOUR BEST to sleep in the bed at night. DO NOT sleep in your recliner. Long hours of sitting in a recliner leads to swelling of the legs and/or potential wounds on your backside. Additional Orders / Instructions o Follow Nutritious Diet and Increase Protein Intake Wound Treatment Wound #1 - Lower Leg Wound Laterality: Left, Lateral Cleanser: Soap and Water 3 x Per Week/30 Days Discharge Instructions: Gently cleanse wound with antibacterial soap, rinse and pat dry prior to dressing wounds Cleanser: Wound Cleanser 3 x Per Week/30 Days Discharge Instructions: Wash your hands with soap and water. Remove old dressing, discard into plastic bag and place into trash. Cleanse the wound with Wound Cleanser prior to applying a clean dressing using gauze sponges, not tissues or cotton balls. Do not scrub or use excessive force. Pat dry using gauze sponges, not tissue or cotton balls. Veronica Krueger, Veronica Krueger ([id number]) Primary Dressing: Hydrofera Blue Ready Transfer Foam, 2.5x2.5 (in/in) 3 x Per Week/30 Days Discharge Instructions: Apply Hydrofera Blue Ready to wound bed as directed Secondary Dressing: ABD Pad 5x9 (in/in) 3 x Per Week/30 Days Discharge Instructions: Cover with ABD pad Placed on inside of leg under wrap Secondary Dressing: (NON-BORDER) Zetuvit Plus Silicone NON-BORDER 5x5 (in/in) 3 x Per Week/30 Days Compression Wrap: Medichoice 4 layer Compression System, 35-40 mmHG 3 x Per Week/30 Days Discharge Instructions: Apply multi-layer wrap as directed. Electronic Signature(s) Unsigned Entered By: Carlene Coria on 08/11/2021 15:27:29 Signature(s): Date(s): Veronica Krueger  ([id number]) -------------------------------------------------------------------------------- Problem List Details  Patient Name: Veronica Krueger, Veronica Krueger Date of Service: 08/11/2021 2:45 PM Medical Record Number: [id number] Patient Account Number: [id number] Date of Birth/Sex: [birth year] (66 y.o. F) Treating RN: Carlene Coria Primary Care Provider: Georgian Co Other Clinician: Referring Provider: Georgian Co Treating Provider/Extender: Skipper Cliche in Treatment: 23 Active Problems ICD-10 Encounter Code Description Active Date MDM Diagnosis I89.0 Lymphedema, not elsewhere classified 03/03/2021 No Yes L97.822 Non-pressure chronic ulcer of other part of left lower leg with fat layer 03/03/2021 No Yes exposed I73.89 Other specified peripheral vascular diseases 03/03/2021 No Yes I87.2 Venous insufficiency (chronic) (peripheral) 03/03/2021 No Yes I10 Essential (primary) hypertension 03/03/2021 No Yes E11.622 Type 2 diabetes mellitus with other skin ulcer 03/03/2021 No Yes Inactive Problems Resolved Problems Electronic Signature(s) Signed: 08/11/2021 2:59:16 PM By: Worthy Keeler PA-C Entered By: Worthy Keeler on 08/11/2021 14:59:16 Veronica Krueger ([id number]) -------------------------------------------------------------------------------- Progress Note Details Patient Name: Veronica Krueger Date of Service: 08/11/2021 2:45 PM Medical Record Number: [id number] Patient Account Number: [id number] Date of Birth/Sex: January 06, 1956 (66 y.o. F) Treating RN: Carlene Coria Primary Care Provider: Georgian Co Other Clinician: Referring Provider: Georgian Co Treating Provider/Extender: Skipper Cliche in Treatment: 23 Subjective Chief Complaint Information obtained from Patient Left LE Ulcer History of Present Illness (HPI) 03/03/2021 upon evaluation today patient appears to be doing somewhat poorly in regard to the wound on her left lateral lower extremity. She tells me this has been there  since around October 2022. It came up as a blister and she is never had anything like this before. With that being said she currently is concerned about the fact that her home health nurse with [facility] was telling her that this could potentially be infected. With that being said I am really not seeing evidence or signs of infection at this time. Right now they have been using Santyl and then along with the Santyl a wet-to-dry dressing. No compressions been utilized. She did have arterial studies when she was in the hospital in November showed that she had an ABI on the right of 0.78 on the left and 0.75 which is obviously not optimal. The patient does have peripheral vascular disease, diabetes mellitus type 2 though she tells me is diet controlled she takes no medicines and does not check her blood sugars. She also has a history of hypertension and chronic venous insufficiency along with lymphedema that I see today. 03/17/2021 upon evaluation today patient appears to be doing okay in regard to her wound though still appears to be very sloughy. She is also having some increased pain and according to the home health nurse, nurse here in the clinic, the patient this appears to have some odor as well. Again I am not able to smell this but again that is more of a personal issue with my olfactory sense since COVID. Either way I think that right now the concern is this possibly is an infection we do need to address that. I do not want her to get worse by any means. We are still waiting on her to get scheduled for the arterial study which I think needs to be done ASAP. 03/24/2020 upon evaluation today patient appears to be doing well with regard to her wound. She has been tolerating the dressing changes and with the new Levaquin antibiotic she is doing much better as well. Overall I am extremely pleased with where we stand currently. 04/07/2021 upon evaluation patient's wound bed actually showed signs of some  slight improvements. I am actually pleased in that regard.  Fortunately I do not see any evidence of active infection locally or systemically which is great news and overall I think we are headed in the appropriate direction. The patient is pleased to hear this she does have an appointment with the vascular doctor on Wednesday to see what needs to be done to improve her arterial flow. 04/14/2021 upon evaluation today patient appears to be doing well with regard to her wound all things considered. She does need some sharp debridement to clear away necrotic debris. With that being said I discussed that with her today and that is something that now that we know her arterial study is good we can definitely look into proceeding with at this point. Overall her TBI's were stated to be normal although she was noncompressible as far as ABIs were concerned this is great news. 05/12/2021 upon evaluation today patient appears to be doing well with regard to her wound. She has been tolerating the dressing changes without complication. Fortunately I do not see any signs of infection and I think the [facility] is doing quite well here. 05/26/21 upon evaluation today patient appears to be doing well with regard to her wound. She is actually showing signs of improvement which is great news. Fortunately I do not see any evidence of active infection locally nor systemically which is great news. 06-09-2021 upon evaluation today patient fortunately seems to be doing quite well in regard to her wound. This is measuring a little bit smaller although it is definitely taking its time. Fortunately I do not see any signs of active infection locally nor systemically which is great news. No fevers, chills, nausea, vomiting, or diarrhea. 07-07-2021 upon evaluation today patient appears to be doing well with regard to her wound is looking better still slightly hypergranular but much more confluent granulation tissue that looks much  healthier than where its been. Fortunately I do not see any signs of active infection at this time which is great news. No fevers, chills, nausea, vomiting, or diarrhea. 07-28-2021 upon evaluation today patient's wound is actually showing signs of excellent improvement. Fortunately I do not see any evidence of infection locally or systemically which is great news and overall I think we are headed in the right direction here. 08-11-2021 upon evaluation today patient appears to be doing well with regard to her wound this is measuring a little bit small and is showing signs of improvement. Fortunately there does not appear to be any signs of active infection locally or systemically which is great news. No fevers, chills, nausea, vomiting, or diarrhea. Veronica Krueger, Veronica Krueger ([id number]) Objective Constitutional Well-nourished and well-hydrated in no acute distress. Vitals Time Taken: 2:54 PM, Height: 69 in, Temperature: 98.3 F, Pulse: 65 bpm, Respiratory Rate: 18 breaths/min, Blood Pressure: 181/72 mmHg. Respiratory normal breathing without difficulty. Psychiatric this patient is able to make decisions and demonstrates good insight into disease process. Alert and Oriented x 3. pleasant and cooperative. General Notes: Patient's wound bed showed evidence of good granulation and epithelization at this point no sharp debridement was necessary which is great news and overall I am extremely pleased with where things stand today. Integumentary (Hair, Skin) Wound #1 status is Open. Original cause of wound was Blister. The date acquired was: 12/04/2020. The wound has been in treatment 23 weeks. The wound is located on the Left,Lateral Lower Leg. The wound measures 4.5cm length x 4.3cm width x 0.2cm depth; 15.197cm^2 area and 3.039cm^3 volume. There is Fat Layer (Subcutaneous Tissue) exposed. There is no tunneling  or undermining noted. There is a medium amount of serosanguineous drainage noted. There is small  (1-33%) red, hyper - granulation within the wound bed. There is a small (1-33%) amount of necrotic tissue within the wound bed including Adherent Slough. Assessment Active Problems ICD-10 Lymphedema, not elsewhere classified Non-pressure chronic ulcer of other part of left lower leg with fat layer exposed Other specified peripheral vascular diseases Venous insufficiency (chronic) (peripheral) Essential (primary) hypertension Type 2 diabetes mellitus with other skin ulcer Plan Follow-up Appointments: Return Appointment in 2 weeks. - at request Nurse Visit as needed Home Health: [place]: - [place] for wound care. May utilize formulary equivalent dressing for wound treatment orders unless otherwise specified. Home Health Nurse may visit [facility] to address patient s wound care needs. **Please direct any NON-WOUND related issues/requests for orders to patient's Primary Care Physician. **If current dressing causes regression in wound condition, may D/C ordered dressing product/s and apply Normal Saline Moist Dressing daily until next [place] or Other MD appointment. **Notify [facility] of regression in wound condition at [phone]. Other Home Health Orders/Instructions: - if using hydrafera blue with the covering to back with wording on it, please cut slits into back of hydrfera blue prior to apply to wound bed. Bathing/ Shower/ Hygiene: Wash wounds with antibacterial soap and water. No tub bath. Other: - We will do Monday dressing change and that week, HH will change Wednesday and Friday. On weeks patient is not being seen in wound clinic, [place] will need to change dressing and wrap Monday, Wednesday and Friday. Anesthetic (Use 'Patient Medications' Section for Anesthetic Order Entry): Lidocaine applied to wound bed Edema Control - Lymphedema / Segmental Compressive Device / Other: Optional: One layer of unna paste to top of compression  wrap (to act as an anchor). - place ABD pad on inner leg to help with wrap rolling and causing an indent Elevate, Exercise Daily and Avoid Standing for Long Periods of Time. Elevate legs to the level of the heart and pump ankles as often as possible Elevate leg(s) parallel to the floor when sitting. DO YOUR BEST to sleep in the bed at night. DO NOT sleep in your recliner. Long hours of sitting in a recliner leads to swelling of the legs and/or Veronica Krueger, Veronica Krueger ([id number]) potential wounds on your backside. Additional Orders / Instructions: Follow Nutritious Diet and Increase Protein Intake WOUND #1: - Lower Leg Wound Laterality: Left, Lateral Cleanser: Soap and Water 3 x Per Week/30 Days Discharge Instructions: Gently cleanse wound with antibacterial soap, rinse and pat dry prior to dressing wounds Cleanser: Wound Cleanser 3 x Per Week/30 Days Discharge Instructions: Wash your hands with soap and water. Remove old dressing, discard into plastic bag and place into trash. Cleanse the wound with Wound Cleanser prior to applying a clean dressing using gauze sponges, not tissues or cotton balls. Do not scrub or use excessive force. Pat dry using gauze sponges, not tissue or cotton balls. Primary Dressing: Hydrofera Blue Ready Transfer Foam, 2.5x2.5 (in/in) 3 x Per Week/30 Days Discharge Instructions: Apply Hydrofera Blue Ready to wound bed as directed Secondary Dressing: ABD Pad 5x9 (in/in) 3 x Per Week/30 Days Discharge Instructions: Cover with ABD pad Placed on inside of leg under wrap Secondary Dressing: (NON-BORDER) Zetuvit Plus Silicone NON-BORDER 5x5 (in/in) 3 x Per Week/30 Days Compression Wrap: Medichoice 4 layer Compression System, 35-40 mmHG 3 x Per Week/30 Days Discharge Instructions: Apply multi-layer wrap as directed. 1. I am going  to recommend that we go ahead and continue with the wound care measures as before and the patient is in agreement with plan this includes the use of  the [facility]. 2. I am also can recommend that we have the patient continue to monitor for any signs of worsening or infection if anything changes she should let me know soon as possible. 3. We will continue with the 4-layer compression wrap as well. We will see patient back for reevaluation in 1 week here in the clinic. If anything worsens or changes patient will contact our office for additional recommendations. Electronic Signature(s) Signed: 08/11/2021 3:35:05 PM By: Worthy Keeler PA-C Entered By: Worthy Keeler on 08/11/2021 15:35:05 Veronica Krueger ([id number]) -------------------------------------------------------------------------------- SuperBill Details Patient Name: Veronica Krueger Date of Service: 08/11/2021 Medical Record Number: [id number] Patient Account Number: [id number] Date of Birth/Sex: 1955-06-07 (66 y.o. F) Treating RN: Carlene Coria Primary Care Provider: Georgian Co Other Clinician: Referring Provider: Georgian Co Treating Provider/Extender: Skipper Cliche in Treatment: 23 Diagnosis Coding ICD-10 Codes Code Description I89.0 Lymphedema, not elsewhere classified L97.822 Non-pressure chronic ulcer of other part of left lower leg with fat layer exposed I73.89 Other specified peripheral vascular diseases I87.2 Venous insufficiency (chronic) (peripheral) I10 Essential (primary) hypertension E11.622 Type 2 diabetes mellitus with other skin ulcer Facility Procedures CPT4 Code: [id number] Description: (Facility Use Only) [phone] - [place] LWR LT LEG Modifier: Quantity: 1 Physician Procedures CPT4 Code: 6770340 Description: 35248 - WC PHYS LEVEL 3 - EST PT Modifier: Quantity: 1 CPT4 Code: Description: ICD-10 Diagnosis Description I89.0 Lymphedema, not elsewhere classified L97.822 Non-pressure chronic ulcer of other part of left lower leg with fat lay I73.89 Other specified peripheral vascular diseases I87.2 Venous insufficiency  (chronic)  (peripheral) Modifier: er exposed Quantity: Electronic Signature(s) Unsigned Previous Signature: 08/11/2021 3:35:19 PM Version By: Worthy Keeler PA-C Entered By: Carlene Coria on 08/11/2021 15:39:05 Signature(s): Date(s):

## 2021-08-13 NOTE — Progress Notes (Addendum)
Veronica Krueger, Veronica Krueger ([id number]) Visit Report for 08/11/2021 Arrival Information Details Patient Name: Veronica Krueger, Veronica Krueger Date of Service: 08/11/2021 2:45 PM Medical Record Number: [id number] Patient Account Number: [id number] Date of Birth/Sex: September 18, 1955 (66 y.o. F) Treating RN: Carlene Coria Primary Care Adel Burch: Georgian Co Other Clinician: Referring Laquana Villari: Georgian Co Treating Taiya Nutting/Extender: Skipper Cliche in Treatment: 23 Visit Information History Since Last Visit All ordered tests and consults were completed: No Patient Arrived: Wheel Chair Added or deleted any medications: No Arrival Time: 14:51 Any new allergies or adverse reactions: No Accompanied By: self Had a fall or experienced change in No Transfer Assistance: None activities of daily living that may affect Patient Identification Verified: Yes risk of falls: Secondary Verification Process Completed: Yes Signs or symptoms of abuse/neglect since last visito No Patient Requires Transmission-Based No Hospitalized since last visit: No Precautions: Implantable device outside of the clinic excluding No Patient Has Alerts: Yes cellular tissue based products placed in the center Patient Alerts: diabetic type 2 since last visit: 04/10/31 ABI R 0.92 L Has Dressing in Place as Prescribed: Yes 1.04 Has Compression in Place as Prescribed: Yes Pain Present Now: No Electronic Signature(s) Signed: 08/13/2021 2:40:31 PM By: Carlene Coria RN Entered By: Carlene Coria on 08/11/2021 14:54:18 Veronica Krueger ([id number]) -------------------------------------------------------------------------------- Clinic Level of Care Assessment Details Patient Name: Veronica Krueger Date of Service: 08/11/2021 2:45 PM Medical Record Number: [id number] Patient Account Number: [id number] Date of Birth/Sex: [birth year] (66 y.o. F) Treating RN: Carlene Coria Primary Care Wyllow Seigler: Georgian Co Other Clinician: Referring Abria Vannostrand:  Georgian Co Treating Jazae Gandolfi/Extender: Skipper Cliche in Treatment: 23 Clinic Level of Care Assessment Items TOOL 1 Quantity Score '[]'$  - Use when EandM and Procedure is performed on INITIAL visit 0 ASSESSMENTS - Nursing Assessment / Reassessment '[]'$  - General Physical Exam (combine w/ comprehensive assessment (listed just below) when performed on new 0 pt. evals) '[]'$  - 0 Comprehensive Assessment (HX, ROS, Risk Assessments, Wounds Hx, etc.) ASSESSMENTS - Wound and Skin Assessment / Reassessment '[]'$  - Dermatologic / Skin Assessment (not related to wound area) 0 ASSESSMENTS - Ostomy and/or Continence Assessment and Care '[]'$  - Incontinence Assessment and Management 0 '[]'$  - 0 Ostomy Care Assessment and Management (repouching, etc.) PROCESS - Coordination of Care '[]'$  - Simple Patient / Family Education for ongoing care 0 '[]'$  - 0 Complex (extensive) Patient / Family Education for ongoing care '[]'$  - 0 Staff obtains Programmer, systems, Records, Test Results / Process Orders '[]'$  - 0 Staff telephones HHA, Nursing Homes / Clarify orders / etc '[]'$  - 0 Routine Transfer to another Facility (non-emergent condition) '[]'$  - 0 Routine Hospital Admission (non-emergent condition) '[]'$  - 0 New Admissions / Biomedical engineer / Ordering NPWT, Apligraf, etc. '[]'$  - 0 Emergency Hospital Admission (emergent condition) PROCESS - Special Needs '[]'$  - Pediatric / Minor Patient Management 0 '[]'$  - 0 Isolation Patient Management '[]'$  - 0 Hearing / Language / Visual special needs '[]'$  - 0 Assessment of Community assistance (transportation, D/C planning, etc.) '[]'$  - 0 Additional assistance / Altered mentation '[]'$  - 0 Support Surface(s) Assessment (bed, cushion, seat, etc.) INTERVENTIONS - Miscellaneous '[]'$  - External ear exam 0 '[]'$  - 0 Patient Transfer (multiple staff / Civil Service fast streamer / Similar devices) '[]'$  - 0 Simple Staple / Suture removal (25 or less) '[]'$  - 0 Complex Staple / Suture removal (26 or more) '[]'$  -  0 Hypo/Hyperglycemic Management (do not check if billed separately) '[]'$  - 0 Ankle / Brachial Index (ABI) - do not check if billed separately Has the patient  been seen at the hospital within the last three years: Yes Total Score: 0 Level Of Care: ____ Veronica Krueger ([id number]) Electronic Signature(s) Signed: 08/13/2021 2:40:31 PM By: Carlene Coria RN Entered By: Carlene Coria on 08/11/2021 15:38:55 Veronica Krueger ([id number]) -------------------------------------------------------------------------------- Compression Therapy Details Patient Name: Veronica Krueger Date of Service: 08/11/2021 2:45 PM Medical Record Number: [id number] Patient Account Number: [id number] Date of Birth/Sex: 1955/10/02 (66 y.o. F) Treating RN: Carlene Coria Primary Care Leni Pankonin: Georgian Co Other Clinician: Referring Byrant Valent: Georgian Co Treating Avalyn Molino/Extender: Skipper Cliche in Treatment: 23 Compression Therapy Performed for Wound Assessment: Wound #1 Left,Lateral Lower Leg Performed By: Clinician Carlene Coria, RN Compression Type: Four Layer Post Procedure Diagnosis Same as Pre-procedure Electronic Signature(s) Signed: 08/13/2021 2:40:31 PM By: Carlene Coria RN Entered By: Carlene Coria on 08/11/2021 15:38:27 Veronica Krueger ([id number]) -------------------------------------------------------------------------------- Encounter Discharge Information Details Patient Name: Veronica Krueger Date of Service: 08/11/2021 2:45 PM Medical Record Number: [id number] Patient Account Number: [id number] Date of Birth/Sex: 05-12-1955 (66 y.o. F) Treating RN: Carlene Coria Primary Care Kiondre Grenz: Georgian Co Other Clinician: Referring Khaidyn Staebell: Georgian Co Treating Bevely Hackbart/Extender: Skipper Cliche in Treatment: 23 Encounter Discharge Information Items Discharge Condition: Stable Ambulatory Status: Wheelchair Discharge Destination: Home Transportation: Private Auto Accompanied By:  self Schedule Follow-up Appointment: Yes Clinical Summary of Care: Electronic Signature(s) Signed: 08/13/2021 2:40:31 PM By: Carlene Coria RN Entered By: Carlene Coria on 08/11/2021 15:40:01 Veronica Krueger ([id number]) -------------------------------------------------------------------------------- Lower Extremity Assessment Details Patient Name: Veronica Krueger Date of Service: 08/11/2021 2:45 PM Medical Record Number: [id number] Patient Account Number: [id number] Date of Birth/Sex: 1955/04/02 (66 y.o. F) Treating RN: Carlene Coria Primary Care Solomiya Pascale: Georgian Co Other Clinician: Referring Azlynn Mitnick: Georgian Co Treating Renell Allum/Extender: Jeri Cos Weeks in Treatment: 23 Edema Assessment Assessed: [Left: No] [Right: No] Edema: [Left: Ye] [Right: s] Calf Left: Right: Point of Measurement: 32 cm From Medial Instep 62 cm Ankle Left: Right: Point of Measurement: 9 cm From Medial Instep 38 cm Vascular Assessment Pulses: Dorsalis Pedis Palpable: [Left:Yes] Electronic Signature(s) Signed: 08/13/2021 2:40:31 PM By: Carlene Coria RN Entered By: Carlene Coria on 08/11/2021 15:05:22 Veronica Krueger ([id number]) -------------------------------------------------------------------------------- Multi Wound Chart Details Patient Name: Veronica Krueger Date of Service: 08/11/2021 2:45 PM Medical Record Number: [id number] Patient Account Number: [id number] Date of Birth/Sex: 1955/06/04 (66 y.o. F) Treating RN: Carlene Coria Primary Care Kristyana Notte: Georgian Co Other Clinician: Referring Jacinda Kanady: Georgian Co Treating Lydiann Bonifas/Extender: Skipper Cliche in Treatment: 23 Vital Signs Height(in): 69 Pulse(bpm): 65 Weight(lbs): Blood Pressure(mmHg): 181/72 Body Mass Index(BMI): Temperature(F): 98.3 Respiratory Rate(breaths/min): 18 Photos: [N/A:N/A] Wound Location: Left, Lateral Lower Leg N/A N/A Wounding Event: Blister N/A N/A Primary Etiology: Diabetic Wound/Ulcer  of the Lower N/A N/A Extremity Comorbid History: Glaucoma, Anemia, Lymphedema, N/A N/A Hypertension, Type II Diabetes, Gout Date Acquired: 12/04/2020 N/A N/A Weeks of Treatment: 23 N/A N/A Wound Status: Open N/A N/A Wound Recurrence: No N/A N/A Measurements L x W x D (cm) 4.5x4.3x0.2 N/A N/A Area (cm) : 15.197 N/A N/A Volume (cm) : 3.039 N/A N/A % Reduction in Area: 29.10% N/A N/A % Reduction in Volume: 29.10% N/A N/A Classification: Grade 1 N/A N/A Exudate Amount: Medium N/A N/A Exudate Type: Serosanguineous N/A N/A Exudate Color: red, brown N/A N/A Granulation Amount: Small (1-33%) N/A N/A Granulation Quality: Red, Hyper-granulation N/A N/A Necrotic Amount: Small (1-33%) N/A N/A Exposed Structures: Fat Layer (Subcutaneous Tissue): N/A N/A Yes Epithelialization: Medium (34-66%) N/A N/A Treatment Notes Electronic Signature(s) Signed: 08/13/2021 2:40:31 PM By: Carlene Coria RN Entered By: Carlene Coria on 08/11/2021 15:05:33 Veronica Krueger ([id number]) --------------------------------------------------------------------------------  Multi-Disciplinary Care Plan Details Patient Name: Veronica Krueger, Veronica Krueger Date of Service: 08/11/2021 2:45 PM Medical Record Number: [id number] Patient Account Number: [id number] Date of Birth/Sex: [birth year] (66 y.o. F) Treating RN: Carlene Coria Primary Care Taiya Nutting: Georgian Co Other Clinician: Referring Jessa Stinson: Georgian Co Treating Belvie Iribe/Extender: Skipper Cliche in Treatment: 23 Active Inactive Electronic Signature(s) Signed: 09/24/2021 2:15:35 PM By: Gretta Cool, BSN, RN, CWS, Kim RN, BSN Signed: 11/10/2021 2:33:12 PM By: Carlene Coria RN Previous Signature: 08/13/2021 2:40:31 PM Version By: Carlene Coria RN Entered By: Gretta Cool BSN, RN, CWS, Kim on 09/24/2021 14:15:34 Veronica Krueger ([id number]) -------------------------------------------------------------------------------- Pain Assessment Details Patient Name: Veronica Krueger Date of Service: 08/11/2021 2:45 PM Medical Record Number: [id number] Patient Account Number: [id number] Date of Birth/Sex: 1955/04/16 (66 y.o. F) Treating RN: Carlene Coria Primary Care Eliza Grissinger: Georgian Co Other Clinician: Referring Tarry Blayney: Georgian Co Treating Anabelle Bungert/Extender: Skipper Cliche in Treatment: 23 Active Problems Location of Pain Severity and Description of Pain Patient Has Paino No Site Locations Pain Management and Medication Current Pain Management: Electronic Signature(s) Signed: 08/13/2021 2:40:31 PM By: Carlene Coria RN Entered By: Carlene Coria on 08/11/2021 14:54:26 Veronica Krueger ([id number]) -------------------------------------------------------------------------------- Patient/Caregiver Education Details Patient Name: Veronica Krueger Date of Service: 08/11/2021 2:45 PM Medical Record Number: [id number] Patient Account Number: [id number] Date of Birth/Gender: February 22, 1956 (66 y.o. F) Treating RN: Carlene Coria Primary Care Physician: Georgian Co Other Clinician: Referring Physician: Georgian Co Treating Physician/Extender: Skipper Cliche in Treatment: 23 Education Assessment Education Provided To: Patient Education Topics Provided Wound/Skin Impairment: Methods: Explain/Verbal Responses: State content correctly Electronic Signature(s) Signed: 08/13/2021 2:40:31 PM By: Carlene Coria RN Entered By: Carlene Coria on 08/11/2021 15:39:25 Veronica Krueger ([id number]) -------------------------------------------------------------------------------- Wound Assessment Details Patient Name: Veronica Krueger Date of Service: 08/11/2021 2:45 PM Medical Record Number: [id number] Patient Account Number: [id number] Date of Birth/Sex: 1956-01-16 (66 y.o. F) Treating RN: Carlene Coria Primary Care Ermina Oberman: Georgian Co Other Clinician: Referring Tejay Hubert: Georgian Co Treating Samiha Denapoli/Extender: Skipper Cliche in Treatment:  23 Wound Status Wound Number: 1 Primary Diabetic Wound/Ulcer of the Lower Extremity Etiology: Wound Location: Left, Lateral Lower Leg Wound Status: Open Wounding Event: Blister Comorbid Glaucoma, Anemia, Lymphedema, Hypertension, Type Date Acquired: 12/04/2020 History: II Diabetes, Gout Weeks Of Treatment: 23 Clustered Wound: No Photos Wound Measurements Length: (cm) 4.5 Width: (cm) 4.3 Depth: (cm) 0.2 Area: (cm) 15.197 Volume: (cm) 3.039 % Reduction in Area: 29.1% % Reduction in Volume: 29.1% Epithelialization: Medium (34-66%) Tunneling: No Undermining: No Wound Description Classification: Grade 1 Exudate Amount: Medium Exudate Type: Serosanguineous Exudate Color: red, brown Foul Odor After Cleansing: No Slough/Fibrino Yes Wound Bed Granulation Amount: Small (1-33%) Exposed Structure Granulation Quality: Red, Hyper-granulation Fat Layer (Subcutaneous Tissue) Exposed: Yes Necrotic Amount: Small (1-33%) Necrotic Quality: Adherent Slough Electronic Signature(s) Signed: 08/13/2021 2:40:31 PM By: Carlene Coria RN Entered By: Carlene Coria on 08/11/2021 15:04:36 Veronica Krueger ([id number]) -------------------------------------------------------------------------------- Vitals Details Patient Name: Veronica Krueger Date of Service: 08/11/2021 2:45 PM Medical Record Number: [id number] Patient Account Number: [id number] Date of Birth/Sex: 22-Dec-1955 (66 y.o. F) Treating RN: Carlene Coria Primary Care Shiro Ellerman: Georgian Co Other Clinician: Referring Shai Rasmussen: Georgian Co Treating Nevah Dalal/Extender: Skipper Cliche in Treatment: 23 Vital Signs Time Taken: 14:54 Temperature (F): 98.3 Height (in): 69 Pulse (bpm): 65 Respiratory Rate (breaths/min): 18 Blood Pressure (mmHg): 181/72 Reference Range: 80 - 120 mg / dl Electronic Signature(s) Signed: 08/13/2021 2:40:31 PM By: Carlene Coria RN Entered By: Carlene Coria on 08/11/2021 15:00:07

## 2021-08-22 DIAGNOSIS — N1832 Chronic kidney disease, stage 3b: Secondary | ICD-10-CM | POA: Diagnosis not present

## 2021-08-22 DIAGNOSIS — I129 Hypertensive chronic kidney disease with stage 1 through stage 4 chronic kidney disease, or unspecified chronic kidney disease: Secondary | ICD-10-CM | POA: Diagnosis not present

## 2021-08-22 DIAGNOSIS — E782 Mixed hyperlipidemia: Secondary | ICD-10-CM | POA: Diagnosis not present

## 2021-08-28 ENCOUNTER — Ambulatory Visit: Payer: Medicare HMO | Admitting: Physician Assistant

## 2021-09-02 DIAGNOSIS — Z961 Presence of intraocular lens: Secondary | ICD-10-CM | POA: Diagnosis not present

## 2021-09-02 DIAGNOSIS — E113313 Type 2 diabetes mellitus with moderate nonproliferative diabetic retinopathy with macular edema, bilateral: Secondary | ICD-10-CM | POA: Diagnosis not present

## 2021-09-02 DIAGNOSIS — E11311 Type 2 diabetes mellitus with unspecified diabetic retinopathy with macular edema: Secondary | ICD-10-CM | POA: Diagnosis not present

## 2021-09-02 DIAGNOSIS — H47239 Glaucomatous optic atrophy, unspecified eye: Secondary | ICD-10-CM | POA: Diagnosis not present

## 2021-09-02 DIAGNOSIS — H4311 Vitreous hemorrhage, right eye: Secondary | ICD-10-CM | POA: Diagnosis not present

## 2021-09-11 ENCOUNTER — Encounter (HOSPITAL_COMMUNITY): Payer: Self-pay | Admitting: *Deleted

## 2021-10-09 DIAGNOSIS — M21371 Foot drop, right foot: Secondary | ICD-10-CM | POA: Diagnosis not present

## 2021-10-22 DIAGNOSIS — L03116 Cellulitis of left lower limb: Secondary | ICD-10-CM | POA: Diagnosis not present

## 2021-10-22 DIAGNOSIS — G43009 Migraine without aura, not intractable, without status migrainosus: Secondary | ICD-10-CM | POA: Diagnosis not present

## 2021-10-22 DIAGNOSIS — N1832 Chronic kidney disease, stage 3b: Secondary | ICD-10-CM | POA: Diagnosis not present

## 2021-10-22 DIAGNOSIS — L03115 Cellulitis of right lower limb: Secondary | ICD-10-CM | POA: Diagnosis not present

## 2021-10-22 DIAGNOSIS — M109 Gout, unspecified: Secondary | ICD-10-CM | POA: Diagnosis not present

## 2021-10-22 DIAGNOSIS — E118 Type 2 diabetes mellitus with unspecified complications: Secondary | ICD-10-CM | POA: Diagnosis not present

## 2021-10-22 DIAGNOSIS — E049 Nontoxic goiter, unspecified: Secondary | ICD-10-CM | POA: Diagnosis not present

## 2021-10-22 DIAGNOSIS — E785 Hyperlipidemia, unspecified: Secondary | ICD-10-CM | POA: Diagnosis not present

## 2021-10-22 DIAGNOSIS — I10 Essential (primary) hypertension: Secondary | ICD-10-CM | POA: Diagnosis not present

## 2021-11-22 DIAGNOSIS — E782 Mixed hyperlipidemia: Secondary | ICD-10-CM | POA: Diagnosis not present

## 2021-11-22 DIAGNOSIS — N1832 Chronic kidney disease, stage 3b: Secondary | ICD-10-CM | POA: Diagnosis not present

## 2021-11-22 DIAGNOSIS — I129 Hypertensive chronic kidney disease with stage 1 through stage 4 chronic kidney disease, or unspecified chronic kidney disease: Secondary | ICD-10-CM | POA: Diagnosis not present

## 2021-12-15 DIAGNOSIS — I10 Essential (primary) hypertension: Secondary | ICD-10-CM | POA: Diagnosis not present

## 2021-12-15 DIAGNOSIS — E785 Hyperlipidemia, unspecified: Secondary | ICD-10-CM | POA: Diagnosis not present

## 2021-12-15 DIAGNOSIS — M130 Polyarthritis, unspecified: Secondary | ICD-10-CM | POA: Diagnosis not present

## 2021-12-15 DIAGNOSIS — E118 Type 2 diabetes mellitus with unspecified complications: Secondary | ICD-10-CM | POA: Diagnosis not present

## 2021-12-15 DIAGNOSIS — L03116 Cellulitis of left lower limb: Secondary | ICD-10-CM | POA: Diagnosis not present

## 2021-12-15 DIAGNOSIS — E1165 Type 2 diabetes mellitus with hyperglycemia: Secondary | ICD-10-CM | POA: Diagnosis not present

## 2021-12-15 DIAGNOSIS — I890 Lymphedema, not elsewhere classified: Secondary | ICD-10-CM | POA: Diagnosis not present

## 2021-12-16 ENCOUNTER — Encounter: Payer: Self-pay | Admitting: Internal Medicine

## 2021-12-16 ENCOUNTER — Ambulatory Visit (LOCAL_COMMUNITY_HEALTH_CENTER): Payer: Medicare HMO

## 2021-12-16 DIAGNOSIS — Z23 Encounter for immunization: Secondary | ICD-10-CM

## 2021-12-16 DIAGNOSIS — L03116 Cellulitis of left lower limb: Secondary | ICD-10-CM | POA: Diagnosis not present

## 2021-12-16 DIAGNOSIS — Z719 Counseling, unspecified: Secondary | ICD-10-CM

## 2021-12-16 NOTE — Progress Notes (Signed)
Client seen in home for covid-19 and flu vaccine today.  VIS provided. Spikevax information sheet provided.    Are you feeling sick today? No   Have you ever received a dose of COVID-19 Vaccine? [employer], [place], [place], [place], Other) Yes  If yes, which vaccine and how many doses?    Moderna   Did you bring the vaccination record card or other documentation?  Yes   Do you have a health condition or are undergoing treatment that makes you moderately or severely immunocompromised? This would include, but not be limited to: cancer, HIV, organ transplant, immunosuppressive therapy/high-dose corticosteroids, or moderate/severe primary immunodeficiency.  No  Have you received COVID-19 vaccine before or during hematopoietic cell transplant (HCT) or CAR-T-cell therapies? No  Have you ever had an allergic reaction to: (This would include a severe allergic reaction or a reaction that caused hives, swelling, or respiratory distress, including wheezing.) A component of a COVID-19 vaccine or a previous dose of COVID-19 vaccine? No   Have you ever had an allergic reaction to another vaccine (other thanCOVID-19 vaccine) or an injectable medication? (This would include a severe allergic reaction or a reaction that caused hives, swelling, or respiratory distress, including wheezing.)   No    Do you have a history of any of the following:  Myocarditis or Pericarditis No  Dermal fillers:  No  Multisystem Inflammatory Syndrome (MIS-C or MIS-A)? No  COVID-19 disease within the past 3 months? No  Vaccinated with monkeypox vaccine in the last 4 weeks? No  Vaccine administered by Desiree Lucy RN.  Tolerated well. Monitored x 15 minutes.  No issues.   Covid-19 Card and Flu provided for documentation.  After vaccine care reviewed.  Afnan Emberton Shelda Pal, RN

## 2021-12-22 DIAGNOSIS — E860 Dehydration: Secondary | ICD-10-CM | POA: Diagnosis not present

## 2021-12-23 DIAGNOSIS — N1832 Chronic kidney disease, stage 3b: Secondary | ICD-10-CM | POA: Diagnosis not present

## 2021-12-23 DIAGNOSIS — E782 Mixed hyperlipidemia: Secondary | ICD-10-CM | POA: Diagnosis not present

## 2021-12-23 DIAGNOSIS — I129 Hypertensive chronic kidney disease with stage 1 through stage 4 chronic kidney disease, or unspecified chronic kidney disease: Secondary | ICD-10-CM | POA: Diagnosis not present

## 2022-01-05 DIAGNOSIS — M6281 Muscle weakness (generalized): Secondary | ICD-10-CM | POA: Insufficient documentation

## 2022-01-14 DIAGNOSIS — H47239 Glaucomatous optic atrophy, unspecified eye: Secondary | ICD-10-CM | POA: Diagnosis not present

## 2022-01-14 DIAGNOSIS — E113311 Type 2 diabetes mellitus with moderate nonproliferative diabetic retinopathy with macular edema, right eye: Secondary | ICD-10-CM | POA: Diagnosis not present

## 2022-01-14 DIAGNOSIS — Z961 Presence of intraocular lens: Secondary | ICD-10-CM | POA: Diagnosis not present

## 2022-01-14 DIAGNOSIS — H3581 Retinal edema: Secondary | ICD-10-CM | POA: Diagnosis not present

## 2022-01-22 DIAGNOSIS — D509 Iron deficiency anemia, unspecified: Secondary | ICD-10-CM | POA: Diagnosis not present

## 2022-01-22 DIAGNOSIS — D649 Anemia, unspecified: Secondary | ICD-10-CM | POA: Diagnosis not present

## 2022-01-22 DIAGNOSIS — R195 Other fecal abnormalities: Secondary | ICD-10-CM | POA: Diagnosis not present

## 2022-01-23 ENCOUNTER — Encounter: Payer: Self-pay | Admitting: Internal Medicine

## 2022-01-23 ENCOUNTER — Other Ambulatory Visit: Payer: Self-pay | Admitting: *Deleted

## 2022-01-23 ENCOUNTER — Other Ambulatory Visit: Payer: Self-pay | Admitting: Internal Medicine

## 2022-01-23 DIAGNOSIS — D509 Iron deficiency anemia, unspecified: Secondary | ICD-10-CM

## 2022-02-02 DIAGNOSIS — S81802D Unspecified open wound, left lower leg, subsequent encounter: Secondary | ICD-10-CM | POA: Diagnosis not present

## 2022-02-04 ENCOUNTER — Inpatient Hospital Stay: Payer: Medicare HMO

## 2022-02-04 ENCOUNTER — Encounter: Payer: Self-pay | Admitting: Oncology

## 2022-02-04 ENCOUNTER — Inpatient Hospital Stay: Payer: Medicare HMO | Attending: Oncology | Admitting: Oncology

## 2022-02-04 VITALS — BP 165/80 | HR 63 | Temp 96.9°F

## 2022-02-04 DIAGNOSIS — N1831 Chronic kidney disease, stage 3a: Secondary | ICD-10-CM

## 2022-02-04 DIAGNOSIS — E1122 Type 2 diabetes mellitus with diabetic chronic kidney disease: Secondary | ICD-10-CM | POA: Diagnosis not present

## 2022-02-04 DIAGNOSIS — D649 Anemia, unspecified: Secondary | ICD-10-CM | POA: Diagnosis not present

## 2022-02-04 DIAGNOSIS — Z79899 Other long term (current) drug therapy: Secondary | ICD-10-CM | POA: Diagnosis not present

## 2022-02-04 DIAGNOSIS — N183 Chronic kidney disease, stage 3 unspecified: Secondary | ICD-10-CM | POA: Insufficient documentation

## 2022-02-04 DIAGNOSIS — D509 Iron deficiency anemia, unspecified: Secondary | ICD-10-CM

## 2022-02-04 DIAGNOSIS — I129 Hypertensive chronic kidney disease with stage 1 through stage 4 chronic kidney disease, or unspecified chronic kidney disease: Secondary | ICD-10-CM | POA: Insufficient documentation

## 2022-02-04 LAB — CBC WITH DIFFERENTIAL/PLATELET
Abs Immature Granulocytes: 0.02 10*3/uL (ref 0.00–0.07)
Basophils Absolute: 0 10*3/uL (ref 0.0–0.1)
Basophils Relative: 0 %
Eosinophils Absolute: 0.1 10*3/uL (ref 0.0–0.5)
Eosinophils Relative: 2 %
HCT: 23.6 % — ABNORMAL LOW (ref 36.0–46.0)
Hemoglobin: 7 g/dL — ABNORMAL LOW (ref 12.0–15.0)
Immature Granulocytes: 0 %
Lymphocytes Relative: 12 %
Lymphs Abs: 0.8 10*3/uL (ref 0.7–4.0)
MCH: 24.1 pg — ABNORMAL LOW (ref 26.0–34.0)
MCHC: 29.7 g/dL — ABNORMAL LOW (ref 30.0–36.0)
MCV: 81.1 fL (ref 80.0–100.0)
Monocytes Absolute: 0.4 10*3/uL (ref 0.1–1.0)
Monocytes Relative: 6 %
Neutro Abs: 4.9 10*3/uL (ref 1.7–7.7)
Neutrophils Relative %: 80 %
Platelets: 167 10*3/uL (ref 150–400)
RBC: 2.91 MIL/uL — ABNORMAL LOW (ref 3.87–5.11)
RDW: 19.4 % — ABNORMAL HIGH (ref 11.5–15.5)
WBC: 6.2 10*3/uL (ref 4.0–10.5)
nRBC: 0 % (ref 0.0–0.2)

## 2022-02-04 LAB — RETIC PANEL
Immature Retic Fract: 12.9 % (ref 2.3–15.9)
RBC.: 2.89 MIL/uL — ABNORMAL LOW (ref 3.87–5.11)
Retic Count, Absolute: 39.6 10*3/uL (ref 19.0–186.0)
Retic Ct Pct: 1.4 % (ref 0.4–3.1)
Reticulocyte Hemoglobin: 21.5 pg — ABNORMAL LOW (ref >27.9)

## 2022-02-04 LAB — COMPREHENSIVE METABOLIC PANEL
ALT: 13 U/L (ref 0–44)
AST: 21 U/L (ref 15–41)
Albumin: 3.3 g/dL — ABNORMAL LOW (ref 3.5–5.0)
Alkaline Phosphatase: 59 U/L (ref 38–126)
Anion gap: 4 — ABNORMAL LOW (ref 5–15)
BUN: 25 mg/dL — ABNORMAL HIGH (ref 8–23)
CO2: 24 mmol/L (ref 22–32)
Calcium: 8.5 mg/dL — ABNORMAL LOW (ref 8.9–10.3)
Chloride: 113 mmol/L — ABNORMAL HIGH (ref 98–111)
Creatinine, Ser: 1.07 mg/dL — ABNORMAL HIGH (ref 0.44–1.00)
GFR, Estimated: 58 mL/min — ABNORMAL LOW (ref >60)
Glucose, Bld: 119 mg/dL — ABNORMAL HIGH (ref 70–99)
Potassium: 3.6 mmol/L (ref 3.5–5.1)
Sodium: 141 mmol/L (ref 135–145)
Total Bilirubin: 0.4 mg/dL (ref 0.3–1.2)
Total Protein: 7.2 g/dL (ref 6.5–8.1)

## 2022-02-04 LAB — IRON AND TIBC
Iron: 26 ug/dL — ABNORMAL LOW (ref 28–170)
Saturation Ratios: 11 % (ref 10.4–31.8)
TIBC: 239 ug/dL — ABNORMAL LOW (ref 250–450)
UIBC: 213 ug/dL

## 2022-02-04 LAB — SAMPLE TO BLOOD BANK

## 2022-02-04 LAB — FERRITIN: Ferritin: 103 ng/mL (ref 11–307)

## 2022-02-04 LAB — LACTATE DEHYDROGENASE: LDH: 148 U/L (ref 98–192)

## 2022-02-04 LAB — VITAMIN B12: Vitamin B-12: 323 pg/mL (ref 180–914)

## 2022-02-04 NOTE — Progress Notes (Signed)
Patient is referred here for anemia.

## 2022-02-04 NOTE — Assessment & Plan Note (Signed)
Check cbc, iron tibc ferritin, B12, folate, myeloma panel, hold tube Probabaly due to anemia secondary to CKD We discussed the possibility of need of IV venofer infusion to further improve her iron store. I discussed about the potential risks including but not limited to allergic reactions/infusion reactions including anaphylactic reactions, phlebitis, high blood pressure, wheezing, SOB, skin rash, weight gain, leg swelling, headache, nausea and fatigue, etc. Patient agrees with IV Venofer.   - available blood work results were reviewed at the time of dictation.  Hb is 7, Iron panel showed borderline saturation at 11%, ferritin is normal. Low retic hemoglobin.  I recommend IV Venofer x 4 doses to further increase iron store Awaiting additional blood results.

## 2022-02-04 NOTE — Progress Notes (Signed)
Hematology/Oncology Consult note [phone] [phone]      Patient Care Team: Mechele Claude, FNP as PCP - General (Family Medicine)   REFERRING PROVIDER: Marval Regal, NP  CHIEF COMPLAINTS/REASON FOR VISIT:  Anemia  ASSESSMENT & PLAN:  Normocytic anemia Check cbc, iron tibc ferritin, B12, folate, myeloma panel, hold tube Probabaly due to anemia secondary to CKD We discussed the possibility of need of IV venofer infusion to further improve her iron store. I discussed about the potential risks including but not limited to allergic reactions/infusion reactions including anaphylactic reactions, phlebitis, high blood pressure, wheezing, SOB, skin rash, weight gain, leg swelling, headache, nausea and fatigue, etc. Patient agrees with IV Venofer.   - available blood work results were reviewed at the time of dictation.  Hb is 7, Iron panel showed borderline saturation at 11%, ferritin is normal. Low retic hemoglobin.  I recommend IV Venofer x 4 doses to further increase iron store Awaiting additional blood results.   CKD (chronic kidney disease) stage 3, GFR 30-59 ml/min (HCC) Encourage oral hydration, avoid nephrotoxins.  Pending above work up  Orders Placed This Encounter  Procedures   Vitamin B12    Standing Status:   Future    Number of Occurrences:   1    Standing Expiration Date:   02/05/2023   Folate    Standing Status:   Future    Number of Occurrences:   1    Standing Expiration Date:   02/05/2023   Ferritin    Standing Status:   Future    Number of Occurrences:   1    Standing Expiration Date:   08/06/2022   Retic Panel    Standing Status:   Future    Number of Occurrences:   1    Standing Expiration Date:   02/05/2023   CBC with Differential/Platelet    Standing Status:   Future    Number of Occurrences:   1    Standing Expiration Date:   02/05/2023   Multiple Myeloma Panel (SPEP&IFE w/QIG)    Standing Status:   Future     Number of Occurrences:   1    Standing Expiration Date:   02/05/2023   Kappa/lambda light chains    Standing Status:   Future    Number of Occurrences:   1    Standing Expiration Date:   02/05/2023   Lactate dehydrogenase    Standing Status:   Future    Number of Occurrences:   1    Standing Expiration Date:   02/05/2023   Iron and TIBC    Standing Status:   Future    Number of Occurrences:   1    Standing Expiration Date:   02/05/2023   Comprehensive metabolic panel    Standing Status:   Future    Number of Occurrences:   1    Standing Expiration Date:   02/05/2023   Hold Tube- Blood Bank    Standing Status:   Future    Number of Occurrences:   1    Standing Expiration Date:   02/05/2023    All questions were answered. The patient knows to call the clinic with any problems, questions or concerns.  Earlie Server, MD, PhD [facility] Hematology Oncology 02/04/2022     HISTORY OF PRESENTING ILLNESS:  Veronica Krueger is a  66 y.o.  female with PMH listed below who was referred to me for anemia Reviewed patient's recent labs that was  done.  She was found to have abnormal CBC on 01/22/22 Hemoglobin of 7.2 Reviewed patient's previous labs ordered, anemia is chronic onset No aggravating or improving factors.  She reports feeling very tired and fatigued.  She had not noticed any recent bleeding such as epistaxis, hematuria or hematochezia.  She denies over the counter NSAID ingestion. She takes Celecoxib sometimes.  Overdue for colonoscopy    MEDICAL HISTORY:  Past Medical History:  Diagnosis Date   Anemia    Diabetes mellitus without complication ([place])    type 2    Hypertension    Left hand weakness    related to previous stomach abscess surgery    Lymph edema    legs    Migraine     SURGICAL HISTORY: Past Surgical History:  Procedure Laterality Date   bilateral cataract surgery      history of stomach surgery      due to left sided abscess of stomach - 2010     LAPAROSCOPIC GASTRIC SLEEVE RESECTION N/A 02/08/2018   Procedure: LAPAROSCOPIC GASTRIC SLEEVE RESECTION WITH HIATAL HERNIA REPAIR AND UPPER ENDO, [facility];  Surgeon: Johnathan Hausen, MD;  Location: [facility];  Service: General;  Laterality: N/A;    SOCIAL HISTORY: Social History   Socioeconomic History   Marital status: Single    Spouse name: Not on file   Number of children: Not on file   Years of education: Not on file   Highest education level: Not on file  Occupational History   Not on file  Tobacco Use   Smoking status: Never   Smokeless tobacco: Never  Vaping Use   Vaping Use: Never used  Substance and Sexual Activity   Alcohol use: Never   Drug use: Never   Sexual activity: Not on file  Other Topics Concern   Not on file  Social History Narrative   Lives in [place]; with self; used in health care field/disbaled; no smoking or alcohol.   Social Determinants of Health   Financial Resource Strain: Not on file  Food Insecurity: Not on file  Transportation Needs: Not on file  Physical Activity: Not on file  Stress: Not on file  Social Connections: Not on file  Intimate Partner Violence: Not on file    FAMILY HISTORY: Family History  Problem Relation Age of Onset   Cancer Sister    Diabetes Sister     ALLERGIES:  is allergic to penicillins and gabapentin.  MEDICATIONS:  Current Outpatient Medications  Medication Sig Dispense Refill   acetaminophen (TYLENOL) 500 MG tablet Take 1,000 mg by mouth every 6 (six) hours as needed for moderate pain or headache.      allopurinol (ZYLOPRIM) 100 MG tablet Take 100 mg by mouth daily.     b complex vitamins capsule Take 1 capsule by mouth daily.     calcium carbonate (OS-CAL) 1250 (500 Ca) MG chewable tablet Chew 1 tablet by mouth 3 (three) times daily.     celecoxib (CELEBREX) 200 MG capsule Take 200 mg by mouth 2 (two) times daily.     dorzolamide (TRUSOPT) 2 % ophthalmic solution Place 1 drop into the right eye 2  (two) times daily.   99   ferrous sulfate 325 (65 FE) MG tablet Take 325 mg by mouth daily.     glucose blood test strip 1 each by Other route as needed for other. Use as instructed     latanoprost (XALATAN) 0.005 % ophthalmic solution Place 1 drop into the  right eye at bedtime.      metoprolol tartrate (LOPRESSOR) 25 MG tablet Take 25 mg by mouth 2 (two) times daily.     Multiple Vitamin (MULTIVITAMIN) tablet Take 1 tablet by mouth daily.     timolol (TIMOPTIC) 0.5 % ophthalmic solution Place 1 drop into both eyes 2 (two) times daily.   4   Vitamin D, Ergocalciferol, (DRISDOL) 1.25 MG (50000 UT) CAPS capsule Take 50,000 Units by mouth once a week.     Erenumab-aooe (AIMOVIG, 140 MG DOSE,) 70 MG/ML SOAJ Inject 140 mg into the skin every 28 (twenty-eight) days. (Patient not taking: Reported on 02/04/2022)     rosuvastatin (CRESTOR) 10 MG tablet Take 10 mg by mouth every evening. Patient admits to noncompliance with this medication (Patient not taking: Reported on 02/04/2022)     topiramate (TOPAMAX) 25 MG tablet Take 25 mg by mouth 2 (two) times daily. (Patient not taking: Reported on 02/04/2022)     vitamin B-12 1000 MCG tablet Take 1 tablet (1,000 mcg total) by mouth daily. (Patient not taking: Reported on 02/04/2022) 30 tablet 0   No current facility-administered medications for this visit.    Review of Systems  Constitutional:  Positive for fatigue. Negative for appetite change, chills and fever.  HENT:   Negative for hearing loss and voice change.   Eyes:  Negative for eye problems.  Respiratory:  Negative for chest tightness and cough.   Cardiovascular:  Positive for leg swelling. Negative for chest pain.  Gastrointestinal:  Negative for abdominal distention, abdominal pain and blood in stool.  Endocrine: Negative for hot flashes.  Genitourinary:  Negative for difficulty urinating and frequency.   Musculoskeletal:  Negative for arthralgias.  Skin:  Negative for itching and rash.   Neurological:  Negative for extremity weakness.  Hematological:  Negative for adenopathy.  Psychiatric/Behavioral:  Negative for confusion.     PHYSICAL EXAMINATION: ECOG PERFORMANCE STATUS: 2 - Symptomatic, <50% confined to bed Vitals:   02/04/22 1521  BP: (!) 165/80  Pulse: 63  Temp: (!) 96.9 F (36.1 C)  SpO2: 100%   There were no vitals filed for this visit.  Physical Exam Constitutional:      General: She is not in acute distress.    Appearance: She is obese.  HENT:     Head: Normocephalic and atraumatic.  Eyes:     General: No scleral icterus. Neck:     Comments: thyroidmegaly Cardiovascular:     Rate and Rhythm: Normal rate and regular rhythm.     Heart sounds: Normal heart sounds.  Pulmonary:     Effort: Pulmonary effort is normal. No respiratory distress.     Breath sounds: No wheezing.  Abdominal:     General: Bowel sounds are normal. There is no distension.     Palpations: Abdomen is soft.  Musculoskeletal:        General: No deformity. Normal range of motion.     Cervical back: Normal range of motion and neck supple.     Right lower leg: Edema present.     Left lower leg: Edema present.  Skin:    General: Skin is warm and dry.     Findings: No erythema or rash.  Neurological:     Mental Status: She is alert and oriented to person, place, and time. Mental status is at baseline.     Cranial Nerves: No cranial nerve deficit.     Coordination: Coordination normal.  Psychiatric:  Mood and Affect: Mood normal.      LABORATORY DATA:  I have reviewed the data as listed    Latest Ref Rng & Units 02/04/2022    4:01 PM 01/09/2021    5:39 AM 01/08/2021   10:30 PM  CBC  WBC 4.0 - 10.5 K/uL 6.2  4.8    Hemoglobin 12.0 - 15.0 g/dL 7.0  8.2  8.5   Hematocrit 36.0 - 46.0 % 23.6  26.9  28.2   Platelets 150 - 400 K/uL 167  197        Latest Ref Rng & Units 02/04/2022    4:01 PM 01/09/2021    5:39 AM 01/08/2021    5:09 AM  CMP  Glucose 70 - 99  mg/dL 119  98  99   BUN 8 - 23 mg/dL 25  41  38   Creatinine 0.44 - 1.00 mg/dL 1.07  2.00  2.42   Sodium 135 - 145 mmol/L 141  140  141   Potassium 3.5 - 5.1 mmol/L 3.6  4.3  4.3   Chloride 98 - 111 mmol/L 113  110  113   CO2 22 - 32 mmol/L _0 Calcium 8.9 - 10.3 mg/dL 8.5  8.7  8.5   Total Protein 6.5 - 8.1 g/dL 7.2     Total Bilirubin 0.3 - 1.2 mg/dL 0.4     Alkaline Phos 38 - 126 U/L 59     AST 15 - 41 U/L 21     ALT 0 - 44 U/L 13         Component Value Date/Time   IRON 26 (L) 02/04/2022 1601   TIBC 239 (L) 02/04/2022 1601   FERRITIN 103 02/04/2022 1601   IRONPCTSAT 11 02/04/2022 1601     RADIOGRAPHIC STUDIES: I have personally reviewed the radiological images as listed and agreed with the findings in the report. No results found.

## 2022-02-04 NOTE — Assessment & Plan Note (Signed)
Encourage oral hydration, avoid nephrotoxins.  Pending above work up

## 2022-02-04 NOTE — Addendum Note (Signed)
Addended by: Earlie Server on: 02/04/2022 10:29 PM   Modules accepted: Orders

## 2022-02-05 ENCOUNTER — Telehealth: Payer: Self-pay

## 2022-02-05 LAB — FOLATE: Folate: >40 ng/mL (ref >5.9)

## 2022-02-05 LAB — KAPPA/LAMBDA LIGHT CHAINS
Kappa free light chain: 123.2 mg/L — ABNORMAL HIGH (ref 3.3–19.4)
Kappa, lambda light chain ratio: 2.02 — ABNORMAL HIGH (ref 0.26–1.65)
Lambda free light chains: 60.9 mg/L — ABNORMAL HIGH (ref 5.7–26.3)

## 2022-02-05 NOTE — Telephone Encounter (Signed)
Called and informed pt of MD recommendation and plan. She verbalized understanding, but said she would call back to schedule appts because she needs to get in touch with the person that provides transportation for her.

## 2022-02-05 NOTE — Telephone Encounter (Signed)
-----   Message from Earlie Server, MD sent at 02/04/2022 10:27 PM EST ----- Please arrange her to get IV Venofer x 4 doses, first dose this week if possible,  2 doses next week, 3 days apart, and 4th dose the following week.  Follow up in 2 weeks, labs -MD +/- venofer, +/- retacrit.

## 2022-02-09 ENCOUNTER — Inpatient Hospital Stay: Payer: Medicare HMO

## 2022-02-09 ENCOUNTER — Encounter: Payer: Self-pay | Admitting: Internal Medicine

## 2022-02-09 VITALS — BP 164/79 | HR 72 | Temp 97.1°F | Resp 20

## 2022-02-09 DIAGNOSIS — D649 Anemia, unspecified: Secondary | ICD-10-CM

## 2022-02-09 DIAGNOSIS — E1122 Type 2 diabetes mellitus with diabetic chronic kidney disease: Secondary | ICD-10-CM | POA: Diagnosis not present

## 2022-02-09 DIAGNOSIS — Z79899 Other long term (current) drug therapy: Secondary | ICD-10-CM | POA: Diagnosis not present

## 2022-02-09 DIAGNOSIS — I129 Hypertensive chronic kidney disease with stage 1 through stage 4 chronic kidney disease, or unspecified chronic kidney disease: Secondary | ICD-10-CM | POA: Diagnosis not present

## 2022-02-09 DIAGNOSIS — N183 Chronic kidney disease, stage 3 unspecified: Secondary | ICD-10-CM | POA: Diagnosis not present

## 2022-02-09 DIAGNOSIS — Z9884 Bariatric surgery status: Secondary | ICD-10-CM

## 2022-02-09 MED ORDER — SODIUM CHLORIDE 0.9 % IV SOLN
200.0000 mg | Freq: Once | INTRAVENOUS | Status: AC
  Administered 2022-02-09: 200 mg via INTRAVENOUS
  Filled 2022-02-09: qty 200

## 2022-02-09 MED ORDER — SODIUM CHLORIDE 0.9 % IV SOLN
Freq: Once | INTRAVENOUS | Status: AC
  Filled 2022-02-09: qty 250

## 2022-02-10 LAB — MULTIPLE MYELOMA PANEL, SERUM
Albumin SerPl Elph-Mcnc: 3.4 g/dL (ref 2.9–4.4)
Albumin/Glob SerPl: 1.1 (ref 0.7–1.7)
Alpha 1: 0.2 g/dL (ref 0.0–0.4)
Alpha2 Glob SerPl Elph-Mcnc: 0.6 g/dL (ref 0.4–1.0)
B-Globulin SerPl Elph-Mcnc: 0.8 g/dL (ref 0.7–1.3)
Gamma Glob SerPl Elph-Mcnc: 1.7 g/dL (ref 0.4–1.8)
Globulin, Total: 3.3 g/dL (ref 2.2–3.9)
IgA: 53 mg/dL — ABNORMAL LOW (ref 87–352)
IgG (Immunoglobin G), Serum: 2029 mg/dL — ABNORMAL HIGH (ref 586–1602)
IgM (Immunoglobulin M), Srm: 37 mg/dL (ref 26–217)
M Protein SerPl Elph-Mcnc: 1.2 g/dL — ABNORMAL HIGH
Total Protein ELP: 6.7 g/dL (ref 6.0–8.5)

## 2022-02-11 ENCOUNTER — Other Ambulatory Visit: Payer: Self-pay

## 2022-02-11 ENCOUNTER — Inpatient Hospital Stay: Payer: Medicare HMO

## 2022-02-11 ENCOUNTER — Telehealth: Payer: Self-pay | Admitting: Oncology

## 2022-02-11 VITALS — BP 169/71 | HR 77 | Temp 98.0°F | Resp 22

## 2022-02-11 DIAGNOSIS — E1122 Type 2 diabetes mellitus with diabetic chronic kidney disease: Secondary | ICD-10-CM | POA: Diagnosis not present

## 2022-02-11 DIAGNOSIS — N183 Chronic kidney disease, stage 3 unspecified: Secondary | ICD-10-CM | POA: Diagnosis not present

## 2022-02-11 DIAGNOSIS — D649 Anemia, unspecified: Secondary | ICD-10-CM

## 2022-02-11 DIAGNOSIS — Z79899 Other long term (current) drug therapy: Secondary | ICD-10-CM | POA: Diagnosis not present

## 2022-02-11 DIAGNOSIS — I129 Hypertensive chronic kidney disease with stage 1 through stage 4 chronic kidney disease, or unspecified chronic kidney disease: Secondary | ICD-10-CM | POA: Diagnosis not present

## 2022-02-11 DIAGNOSIS — Z9884 Bariatric surgery status: Secondary | ICD-10-CM

## 2022-02-11 MED ORDER — SODIUM CHLORIDE 0.9 % IV SOLN
Freq: Once | INTRAVENOUS | Status: AC
  Filled 2022-02-11: qty 250

## 2022-02-11 MED ORDER — SODIUM CHLORIDE 0.9 % IV SOLN
200.0000 mg | Freq: Once | INTRAVENOUS | Status: AC
  Administered 2022-02-11: 200 mg via INTRAVENOUS
  Filled 2022-02-11: qty 200

## 2022-02-11 NOTE — Telephone Encounter (Signed)
LVM with updated appt information per MD req.Marland [email or web address]

## 2022-02-11 NOTE — Patient Instructions (Signed)

## 2022-02-18 ENCOUNTER — Inpatient Hospital Stay: Payer: Medicare HMO

## 2022-02-18 VITALS — BP 152/83 | HR 63 | Resp 18

## 2022-02-18 DIAGNOSIS — I70219 Atherosclerosis of native arteries of extremities with intermittent claudication, unspecified extremity: Secondary | ICD-10-CM | POA: Diagnosis not present

## 2022-02-18 DIAGNOSIS — Z79899 Other long term (current) drug therapy: Secondary | ICD-10-CM | POA: Diagnosis not present

## 2022-02-18 DIAGNOSIS — D649 Anemia, unspecified: Secondary | ICD-10-CM | POA: Diagnosis not present

## 2022-02-18 DIAGNOSIS — I129 Hypertensive chronic kidney disease with stage 1 through stage 4 chronic kidney disease, or unspecified chronic kidney disease: Secondary | ICD-10-CM | POA: Diagnosis not present

## 2022-02-18 DIAGNOSIS — E1151 Type 2 diabetes mellitus with diabetic peripheral angiopathy without gangrene: Secondary | ICD-10-CM | POA: Diagnosis not present

## 2022-02-18 DIAGNOSIS — M1990 Unspecified osteoarthritis, unspecified site: Secondary | ICD-10-CM | POA: Diagnosis not present

## 2022-02-18 DIAGNOSIS — H409 Unspecified glaucoma: Secondary | ICD-10-CM | POA: Diagnosis not present

## 2022-02-18 DIAGNOSIS — M109 Gout, unspecified: Secondary | ICD-10-CM | POA: Diagnosis not present

## 2022-02-18 DIAGNOSIS — Z9884 Bariatric surgery status: Secondary | ICD-10-CM

## 2022-02-18 DIAGNOSIS — E1140 Type 2 diabetes mellitus with diabetic neuropathy, unspecified: Secondary | ICD-10-CM | POA: Diagnosis not present

## 2022-02-18 DIAGNOSIS — N183 Chronic kidney disease, stage 3 unspecified: Secondary | ICD-10-CM | POA: Diagnosis not present

## 2022-02-18 DIAGNOSIS — L97929 Non-pressure chronic ulcer of unspecified part of left lower leg with unspecified severity: Secondary | ICD-10-CM | POA: Diagnosis not present

## 2022-02-18 DIAGNOSIS — K219 Gastro-esophageal reflux disease without esophagitis: Secondary | ICD-10-CM | POA: Diagnosis not present

## 2022-02-18 DIAGNOSIS — N1832 Chronic kidney disease, stage 3b: Secondary | ICD-10-CM | POA: Diagnosis not present

## 2022-02-18 DIAGNOSIS — E1122 Type 2 diabetes mellitus with diabetic chronic kidney disease: Secondary | ICD-10-CM | POA: Diagnosis not present

## 2022-02-18 MED ORDER — SODIUM CHLORIDE 0.9 % IV SOLN
Freq: Once | INTRAVENOUS | Status: AC
  Filled 2022-02-18: qty 250

## 2022-02-18 MED ORDER — SODIUM CHLORIDE 0.9 % IV SOLN
200.0000 mg | Freq: Once | INTRAVENOUS | Status: AC
  Administered 2022-02-18: 200 mg via INTRAVENOUS
  Filled 2022-02-18: qty 200

## 2022-02-18 NOTE — Progress Notes (Signed)
After getting IV site and starting fluids- the site blew. Two other attempts made to obtain IV access were unsuccessful.  Pt will skip today's treatment and call and reschedule it.  Dr Tasia Catchings and scheduler made aware

## 2022-02-20 ENCOUNTER — Ambulatory Visit: Payer: Medicare HMO

## 2022-02-24 ENCOUNTER — Ambulatory Visit (INDEPENDENT_AMBULATORY_CARE_PROVIDER_SITE_OTHER): Payer: Medicare HMO | Admitting: Vascular Surgery

## 2022-02-24 ENCOUNTER — Encounter (INDEPENDENT_AMBULATORY_CARE_PROVIDER_SITE_OTHER): Payer: Self-pay | Admitting: Vascular Surgery

## 2022-02-24 VITALS — BP 162/62 | HR 66 | Resp 16 | Ht 69.0 in

## 2022-02-24 DIAGNOSIS — N1831 Chronic kidney disease, stage 3a: Secondary | ICD-10-CM

## 2022-02-24 DIAGNOSIS — E119 Type 2 diabetes mellitus without complications: Secondary | ICD-10-CM | POA: Diagnosis not present

## 2022-02-24 DIAGNOSIS — I10 Essential (primary) hypertension: Secondary | ICD-10-CM | POA: Diagnosis not present

## 2022-02-24 DIAGNOSIS — I890 Lymphedema, not elsewhere classified: Secondary | ICD-10-CM | POA: Diagnosis not present

## 2022-02-24 NOTE — Assessment & Plan Note (Signed)
>>  ASSESSMENT AND PLAN FOR MORBID OBESITY (HCC) WRITTEN ON 02/24/2022  2:14 PM BY DEW, Marlow Baars, MD  Her weight and immobility are going to make her leg swelling almost impossible to eliminate.

## 2022-02-24 NOTE — Assessment & Plan Note (Signed)
Her weight and immobility are going to make her leg swelling almost impossible to eliminate.

## 2022-02-24 NOTE — Assessment & Plan Note (Signed)
blood pressure control important in reducing the progression of atherosclerotic disease. On appropriate oral medications.  

## 2022-02-24 NOTE — Assessment & Plan Note (Addendum)
The patient has stage III lymphedema with chronic scarring channels.  She has been in [employer] for compression sleeves for almost a year now.  Despite this, she has persistent and recurring ulcerations of the left lower extremity and persistent swelling which is debilitating and disabling.  She has severe hyperpigmentation. She is very immobile and this leg swelling has impaired her exercise ability.  She has been elevating her legs for many months.  She cannot get on traditional compression socks, but she has been in compression [employer] wraps now for almost a year.  Her immobility and weight are going to make eliminating her leg swelling essentially impossible, but we need to get this under better control to avoid recurrent ulcerations.  I believe a referral to the lymphedema clinic would be of great benefit.  I believe the lymphedema pump would be an excellent option to try to improve her swelling.  Will try to get this approved in the near future.  Will see her back in a few months.  We discussed the importance of trying to keep her legs elevated and wrapped is much as possible.

## 2022-02-24 NOTE — Progress Notes (Addendum)
Patient ID: Veronica Krueger, female   DOB: [birth year], 67 y.o.   MRN: [id number]  Chief Complaint  Patient presents with   New Patient (Initial Visit)    Consult for lympedema    HPI Veronica Krueger is a 67 y.o. female.  I am asked to see the patient by Georgian Co for evaluation of lymphedema and consideration for a lymphedema pump.  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  She was actually seen in our office with a vascular lab only study almost a year ago and found to have relatively normal arterial flow.  She is currently wrapped in what appears to be an Haematologist on the left leg that she says is being done by home health.  She denies any trauma or injury that started the wound.  Both legs are very swollen with the right leg being more swollen at this time.  She is morbidly obese and very immobile only using a wheelchair and not walking at all.  She has had previous abdominal surgery.  She has had the recurrent ulceration of the left leg.     Past Medical History:  Diagnosis Date   Anemia    Diabetes mellitus without complication ([place])    type 2    Hypertension    Left hand weakness    related to previous stomach abscess surgery    Lymph edema    legs    Migraine     Past Surgical History:  Procedure Laterality Date   bilateral cataract surgery      history of stomach surgery      due to left sided abscess of stomach - 2010    LAPAROSCOPIC GASTRIC SLEEVE RESECTION N/A 02/08/2018   Procedure: LAPAROSCOPIC GASTRIC SLEEVE RESECTION WITH HIATAL HERNIA REPAIR AND UPPER ENDO, ERAS Pathway;  Surgeon: Johnathan Hausen, MD;  Location: [facility];  Service: General;  Laterality: N/A;     Family History  Problem Relation Age of Onset   Cancer Sister    Diabetes Sister   No bleeding or clotting disorders   Social History   Tobacco Use   Smoking status: Never   Smokeless tobacco: Never  Vaping Use   Vaping Use: Never used  Substance  Use Topics   Alcohol use: Never   Drug use: Never     Allergies  Allergen Reactions   Penicillins Itching, Swelling and Other (See Comments)    Patient has tolerated Ceftriaxone   Has patient had a PCN reaction causing immediate rash, facial/tongue/throat swelling, SOB or lightheadedness with hypotension:  Has patient had a PCN reaction causing severe rash involving mucus membranes or skin necrosis:  Has patient had a PCN reaction that required hospitalization: Has patient had a PCN reaction occurring within the last 10 years: No If all of the above answers are "NO", then may proceed with Cephalosporin use.    Gabapentin Other (See Comments) and Itching    Hair loss  Hair loss Other reaction(s): Other (See Comments), Other (See Comments), Other (See Comments) Hair loss Hair loss Hair loss Hair loss Hair loss Hair loss Hair loss Hair loss Hair loss Hair loss     Current Outpatient Medications  Medication Sig Dispense Refill   acetaminophen (TYLENOL) 500 MG tablet Take 1,000 mg by mouth every 6 (six) hours as needed for moderate pain or headache.      allopurinol (ZYLOPRIM) 100 MG tablet Take 100 mg by mouth daily.  b complex vitamins capsule Take 1 capsule by mouth daily.     dorzolamide (TRUSOPT) 2 % ophthalmic solution Place 1 drop into the right eye 2 (two) times daily.   99   ferrous sulfate 325 (65 FE) MG tablet Take 325 mg by mouth daily.     glucose blood test strip 1 each by Other route as needed for other. Use as instructed     latanoprost (XALATAN) 0.005 % ophthalmic solution Place 1 drop into the right eye at bedtime.      metoprolol tartrate (LOPRESSOR) 25 MG tablet Take 25 mg by mouth 2 (two) times daily.     Multiple Vitamin (MULTIVITAMIN) tablet Take 1 tablet by mouth daily.     timolol (TIMOPTIC) 0.5 % ophthalmic solution Place 1 drop into both eyes 2 (two) times daily.   4   Vitamin D, Ergocalciferol, (DRISDOL) 1.25 MG (50000 UT) CAPS capsule Take  50,000 Units by mouth once a week.     ascorbic acid (VITAMIN C) 500 MG tablet Take 1 tablet (500 mg total) by mouth 2 (two) times daily. 180 tablet 0   bisacodyl (DULCOLAX) 5 MG EC tablet Take 2 tablets (10 mg total) by mouth daily as needed for moderate constipation. 30 tablet 0   cyanocobalamin 1000 MCG tablet Take 1 tablet (1,000 mcg total) by mouth daily. 90 tablet 0   HYDROcodone-acetaminophen (NORCO/VICODIN) 5-325 MG tablet Take 1 tablet by mouth 2 (two) times daily.     pantoprazole (PROTONIX) 40 MG tablet Take 1 tablet (40 mg total) by mouth daily. 30 tablet 2   polyethylene glycol (MIRALAX / GLYCOLAX) 17 g packet Take 17 g by mouth daily. 14 each 0   No current facility-administered medications for this visit.      REVIEW OF SYSTEMS (Negative unless checked)  Constitutional: '[]'$ Weight loss  '[]'$ Fever  '[]'$ Chills Cardiac: '[]'$ Chest pain   '[]'$ Chest pressure   '[]'$ Palpitations   '[]'$ Shortness of breath when laying flat   '[]'$ Shortness of breath at rest   '[]'$ Shortness of breath with exertion. Vascular:  '[]'$ Pain in legs with walking   '[]'$ Pain in legs at rest   '[]'$ Pain in legs when laying flat   '[]'$ Claudication   '[]'$ Pain in feet when walking  '[]'$ Pain in feet at rest  '[]'$ Pain in feet when laying flat   '[]'$ History of DVT   '[]'$ Phlebitis   '[x]'$ Swelling in legs   '[]'$ Varicose veins   '[]'$ Non-healing ulcers Pulmonary:   '[]'$ Uses home oxygen   '[]'$ Productive cough   '[]'$ Hemoptysis   '[]'$ Wheeze  '[]'$ COPD   '[]'$ Asthma Neurologic:  '[]'$ Dizziness  '[]'$ Blackouts   '[]'$ Seizures   '[]'$ History of stroke   '[]'$ History of TIA  '[]'$ Aphasia   '[]'$ Temporary blindness   '[]'$ Dysphagia   '[]'$ Weakness or numbness in arms   '[]'$ Weakness or numbness in legs Musculoskeletal:  '[x]'$ Arthritis   '[]'$ Joint swelling   '[x]'$ Joint pain   '[]'$ Low back pain Hematologic:  '[]'$ Easy bruising  '[]'$ Easy bleeding   '[]'$ Hypercoagulable state   '[]'$ Anemic  '[]'$ Hepatitis Gastrointestinal:  '[]'$ Blood in stool   '[]'$ Vomiting blood  '[]'$ Gastroesophageal reflux/heartburn   '[]'$ Abdominal pain Genitourinary:  '[]'$ Chronic  kidney disease   '[]'$ Difficult urination  '[]'$ Frequent urination  '[]'$ Burning with urination   '[]'$ Hematuria Skin:  '[]'$ Rashes   '[]'$ Ulcers   '[]'$ Wounds Psychological:  '[]'$ History of anxiety   '[]'$  History of major depression.    Physical Exam BP (!) 162/62 (BP Location: Right Arm)   Pulse 66   Resp 16   Ht '5\' 9"'$  (1.753 m)   BMI 53.07 kg/m  Gen:  WD/WN, NAD.  Morbidly obese Head: [place]/AT, No temporalis wasting.  Ear/Nose/Throat: Hearing grossly intact, nares w/o erythema or drainage, oropharynx w/o Erythema/Exudate Eyes: Conjunctiva clear, sclera non-icteric  Neck: trachea midline.  No JVD.  Pulmonary:  Good air movement, respirations not labored, no use of accessory muscles  Cardiac: RRR, no JVD Vascular:  Vessel Right Left  Radial Palpable Palpable                          DP NP NP  PT NP NP   Gastrointestinal:. No masses, surgical incisions, or scars. Musculoskeletal: M/S 5/5 throughout.  Extremities without ischemic changes.  No deformity or atrophy.  In a wheelchair.  3-4+ right lower extremity edema.  Even in the [employer], there is a least 2+ left lower extremity edema. Neurologic: Sensation grossly intact in extremities.  Symmetrical.  Speech is fluent. Motor exam as listed above. Psychiatric: Judgment intact, Mood & affect appropriate for pt's clinical situation. Dermatologic: Left calf ulceration is currently dressed with Unna boot    Radiology No results found.  Labs Recent Results (from the past 2160 hour(s))  Hold Tube- Blood Bank     Status: None   Collection Time: 02/04/22  4:01 PM  Result Value Ref Range   Blood Bank Specimen SAMPLE AVAILABLE FOR TESTING    Sample Expiration      02/07/2022,2359 Performed at [facility], [address] [place], [place] [address]   Comprehensive metabolic panel     Status: Abnormal   Collection Time: 02/04/22  4:01 PM  Result Value Ref Range   Sodium 141 135 - 145 mmol/L   Potassium 3.6 3.5 - 5.1 mmol/L   Chloride 113  (H) 98 - 111 mmol/L   CO2 24 22 - 32 mmol/L   Glucose, Bld 119 (H) 70 - 99 mg/dL    Comment: Glucose reference range applies only to samples taken after fasting for at least 8 hours.   BUN 25 (H) 8 - 23 mg/dL   Creatinine, Ser 1.07 (H) 0.44 - 1.00 mg/dL   Calcium 8.5 (L) 8.9 - 10.3 mg/dL   Total Protein 7.2 6.5 - 8.1 g/dL   Albumin 3.3 (L) 3.5 - 5.0 g/dL   AST 21 15 - 41 U/L   ALT 13 0 - 44 U/L   Alkaline Phosphatase 59 38 - 126 U/L   Total Bilirubin 0.4 0.3 - 1.2 mg/dL   GFR, Estimated 58 (L) >60 mL/min    Comment: (NOTE) Calculated using the CKD-EPI Creatinine Equation (2021)    Anion gap 4 (L) 5 - 15    Comment: Performed at [facility], [address] [place], [place] [address]  Iron and TIBC     Status: Abnormal   Collection Time: 02/04/22  4:01 PM  Result Value Ref Range   Iron 26 (L) 28 - 170 ug/dL   TIBC 239 (L) 250 - 450 ug/dL   Saturation Ratios 11 10.4 - 31.8 %   UIBC 213 ug/dL    Comment: Performed at [facility], [address] [place], [place] [address]  Lactate dehydrogenase     Status: None   Collection Time: 02/04/22  4:01 PM  Result Value Ref Range   LDH 148 98 - 192 U/L    Comment: Performed at [facility], [address], [place], [place] [address]  Kappa/lambda light chains     Status: Abnormal   Collection Time: 02/04/22  4:01 PM  Result Value Ref Range   Kappa free light chain 123.2 (H) 3.3 - 19.4 mg/L   Lambda free light chains 60.9 (H) 5.7 - 26.3 mg/L   Kappa, lambda light chain ratio 2.02 (H) 0.26 - 1.65    Comment: (NOTE) Performed At: [facility]  [place], [place] [id number] Rush Farmer MD [id number]   Multiple Myeloma Panel (SPEP&IFE w/QIG)     Status: Abnormal   Collection Time: 02/04/22  4:01 PM  Result Value Ref Range   IgG (Immunoglobin G), Serum 2,029 (H) 586 - 1,602 mg/dL   IgA 53 (L) 87 - 352 mg/dL   IgM (Immunoglobulin M), Srm 37 26 - 217 mg/dL   Total Protein ELP 6.7 6.0 -  8.5 g/dL   Albumin SerPl Elph-Mcnc 3.4 2.9 - 4.4 g/dL   Alpha 1 0.2 0.0 - 0.4 g/dL   Alpha2 Glob SerPl Elph-Mcnc 0.6 0.4 - 1.0 g/dL   B-Globulin SerPl Elph-Mcnc 0.8 0.7 - 1.3 g/dL   Gamma Glob SerPl Elph-Mcnc 1.7 0.4 - 1.8 g/dL   M Protein SerPl Elph-Mcnc 1.2 (H) Not Observed g/dL    Comment: An additional m spike was observed at a concentration of 0.2 g/dl    Globulin, Total 3.3 2.2 - 3.9 g/dL   Albumin/Glob SerPl 1.1 0.7 - 1.7   IFE 1 Comment (A)     Comment: (NOTE) Immunofixation shows IgG monoclonal protein with lambda light chain specificity. Immunofixation shows IgG monoclonal protein with kappa light chain specificity. PLEASE NOTE: Samples from patients receiving DARZALEX(R) (daratumumab) or SARCLISA(R)(isatuximab-irfc) treatment can appear as an "IgG kappa" and mask a complete response (CR). If this patient is receiving these therapies, this IFE assay interference can be removed by ordering test number [id number]-"Immunofixation, Daratumumab-Specific, Serum" or [id number]-"Immunofixation, Isatuximab-Specific, Serum" and submitting a new sample for testing or by calling the lab to add this test to the current sample.    Please Note Comment     Comment: (NOTE) Protein electrophoresis scan will follow via computer, mail, or courier delivery. Performed At: [facility] [place], [place] [id number] Rush Farmer MD [id number]   CBC with Differential/Platelet     Status: Abnormal   Collection Time: 02/04/22  4:01 PM  Result Value Ref Range   WBC 6.2 4.0 - 10.5 K/uL   RBC 2.91 (L) 3.87 - 5.11 MIL/uL   Hemoglobin 7.0 (L) 12.0 - 15.0 g/dL   HCT 23.6 (L) 36.0 - 46.0 %   MCV 81.1 80.0 - 100.0 fL   MCH 24.1 (L) 26.0 - 34.0 pg   MCHC 29.7 (L) 30.0 - 36.0 g/dL   RDW 19.4 (H) 11.5 - 15.5 %   Platelets 167 150 - 400 K/uL   nRBC 0.0 0.0 - 0.2 %   Neutrophils Relative % 80 %   Neutro Abs 4.9 1.7 - 7.7 K/uL   Lymphocytes Relative 12 %   Lymphs Abs 0.8 0.7 -  4.0 K/uL   Monocytes Relative 6 %   Monocytes Absolute 0.4 0.1 - 1.0 K/uL   Eosinophils Relative 2 %   Eosinophils Absolute 0.1 0.0 - 0.5 K/uL   Basophils Relative 0 %   Basophils Absolute 0.0 0.0 - 0.1 K/uL   Immature Granulocytes 0 %   Abs Immature Granulocytes 0.02 0.00 - 0.07 K/uL    Comment: Performed at [facility], [address] [place], [place] [address]  Retic Panel     Status: Abnormal   Collection Time: 02/04/22  4:01 PM  Result Value Ref Range   Retic Ct Pct 1.4 0.4 - 3.1 %   RBC. 2.89 (L) 3.87 - 5.11 MIL/uL   Retic Count, Absolute 39.6 19.0 - 186.0 K/uL   Immature Retic Fract 12.9 2.3 - 15.9 %   Reticulocyte Hemoglobin 21.5 (L) >27.9 pg    Comment:        A RET-He < 28 pg is an indication of iron-deficient or iron- insufficient erythropoiesis. Patients with thalassemia may also have a decreased RET-He result unrelated to iron availability.     If this patient has chronic kidney disease and does not have a hemoglobinopathy he/she meets criteria for iron deficiency per the 2016 NICE guidelines. Refer to specific guidelines to determine the appropriate thresholds for treating CKD- associated iron deficiency. TSAT and ferritin should be used in patients with hemoglobinopathies (e.g. thalassemia). Performed at [facility], [address] [place], [place] [address]   Ferritin     Status: None   Collection Time: 02/04/22  4:01 PM  Result Value Ref Range   Ferritin 103 11 - 307 ng/mL    Comment: Performed at [facility], [address] [place], [place] [address]  Folate     Status: None   Collection Time: 02/04/22  4:01 PM  Result Value Ref Range   Folate >40.0 >5.9 ng/mL    Comment: CORRECTED RESULTS CALLED TO: DR.BRAHMANDAY AT [id number] ON 12.14.23 BY ISLEY,B Performed at  [facility], [address] [place], [place] [address] CORRECTED ON 12/13 AT 1850: PREVIOUSLY REPORTED AS >50.0   Vitamin B12     Status: None    Collection Time: 02/04/22  4:01 PM  Result Value Ref Range   Vitamin B-12 323 180 - 914 pg/mL    Comment: (NOTE) This assay is not validated for testing neonatal or myeloproliferative syndrome specimens for Vitamin B12 levels. Performed at [facility], [place] [address], [place], [place] [address]   Hold Tube- Blood Bank     Status: None   Collection Time: 02/26/22  2:13 PM  Result Value Ref Range   Blood Bank Specimen SAMPLE AVAILABLE FOR TESTING    Sample Expiration      03/01/2022,2359 Performed at [facility], [address] [place], [place] [address]   Retic Panel     Status: Abnormal   Collection Time: 02/26/22  2:13 PM  Result Value Ref Range   Retic Ct Pct 1.7 0.4 - 3.1 %   RBC. 3.11 (L) 3.87 - 5.11 MIL/uL   Retic Count, Absolute 53.8 19.0 - 186.0 K/uL   Immature Retic Fract 13.4 2.3 - 15.9 %   Reticulocyte Hemoglobin 22.0 (L) >27.9 pg    Comment:        A RET-He < 28 pg is an indication of iron-deficient or iron- insufficient erythropoiesis. Patients with thalassemia may also have a decreased RET-He result unrelated to iron availability.     If this patient has chronic kidney disease and does not have a hemoglobinopathy he/she meets criteria for iron deficiency per the 2016 NICE guidelines. Refer to specific guidelines to determine the appropriate thresholds for treating CKD- associated iron deficiency. TSAT and ferritin should be used in patients with hemoglobinopathies (e.g. thalassemia). Performed at [facility], [address] [place], [place] [address]   CBC with Differential/Platelet     Status: Abnormal   Collection Time: 02/26/22  2:13 PM  Result Value Ref Range   WBC 5.8 4.0 - 10.5  K/uL   RBC 3.07 (L) 3.87 - 5.11 MIL/uL   Hemoglobin 7.5 (L) 12.0 - 15.0 g/dL   HCT 24.7 (L) 36.0 - 46.0 %   MCV 80.5 80.0 - 100.0 fL   MCH 24.4 (L) 26.0 - 34.0 pg   MCHC 30.4 30.0 - 36.0 g/dL   RDW 19.3 (H) 11.5 - 15.5 %   Platelets 164 150  - 400 K/uL   nRBC 0.0 0.0 - 0.2 %   Neutrophils Relative % 76 %   Neutro Abs 4.5 1.7 - 7.7 K/uL   Lymphocytes Relative 12 %   Lymphs Abs 0.7 0.7 - 4.0 K/uL   Monocytes Relative 8 %   Monocytes Absolute 0.4 0.1 - 1.0 K/uL   Eosinophils Relative 2 %   Eosinophils Absolute 0.1 0.0 - 0.5 K/uL   Basophils Relative 1 %   Basophils Absolute 0.0 0.0 - 0.1 K/uL   Immature Granulocytes 1 %   Abs Immature Granulocytes 0.07 0.00 - 0.07 K/uL    Comment: Performed at [facility], [address] [place], [place] [address]  CBC with Differential     Status: Abnormal   Collection Time: 03/09/22  3:46 PM  Result Value Ref Range   WBC 6.6 4.0 - 10.5 K/uL   RBC 3.00 (L) 3.87 - 5.11 MIL/uL   Hemoglobin 7.4 (L) 12.0 - 15.0 g/dL   HCT 24.9 (L) 36.0 - 46.0 %   MCV 83.0 80.0 - 100.0 fL   MCH 24.7 (L) 26.0 - 34.0 pg   MCHC 29.7 (L) 30.0 - 36.0 g/dL   RDW 19.4 (H) 11.5 - 15.5 %   Platelets 180 150 - 400 K/uL   nRBC 0.0 0.0 - 0.2 %   Neutrophils Relative % 79 %   Neutro Abs 5.2 1.7 - 7.7 K/uL   Lymphocytes Relative 11 %   Lymphs Abs 0.7 0.7 - 4.0 K/uL   Monocytes Relative 9 %   Monocytes Absolute 0.6 0.1 - 1.0 K/uL   Eosinophils Relative 1 %   Eosinophils Absolute 0.1 0.0 - 0.5 K/uL   Basophils Relative 0 %   Basophils Absolute 0.0 0.0 - 0.1 K/uL   Immature Granulocytes 0 %   Abs Immature Granulocytes 0.02 0.00 - 0.07 K/uL    Comment: Performed at [facility], [address] [place], [place] [address]  Comprehensive metabolic panel     Status: Abnormal   Collection Time: 03/09/22  3:46 PM  Result Value Ref Range   Sodium 143 135 - 145 mmol/L   Potassium 4.0 3.5 - 5.1 mmol/L   Chloride 111 98 - 111 mmol/L   CO2 27 22 - 32 mmol/L   Glucose, Bld 90 70 - 99 mg/dL    Comment: Glucose reference range applies only to samples taken after fasting for at least 8 hours.   BUN 14 8 - 23 mg/dL   Creatinine, Ser 1.08 (H) 0.44 - 1.00 mg/dL   Calcium 9.1 8.9 - 10.3 mg/dL   Total Protein  6.7 6.5 - 8.1 g/dL   Albumin 3.1 (L) 3.5 - 5.0 g/dL   AST 14 (L) 15 - 41 U/L   ALT 11 0 - 44 U/L   Alkaline Phosphatase 53 38 - 126 U/L   Total Bilirubin 0.9 0.3 - 1.2 mg/dL   GFR, Estimated 57 (L) >60 mL/min    Comment: (NOTE) Calculated using the CKD-EPI Creatinine Equation (2021)    Anion gap 5 5 - 15    Comment:  Performed at [facility], [address] [place], [place] [address]  TSH     Status: None   Collection Time: 03/09/22  3:46 PM  Result Value Ref Range   TSH 1.328 0.350 - 4.500 uIU/mL    Comment: Performed by a 3rd Generation assay with a functional sensitivity of <=0.01 uIU/mL. Performed at [facility], [address] [place], [place] [address]   Hemoglobin A1c     Status: None   Collection Time: 03/09/22  3:46 PM  Result Value Ref Range   Hgb A1c MFr Bld 5.1 4.8 - 5.6 %    Comment: (NOTE) Pre diabetes:          5.7%-6.4%  Diabetes:              >6.4%  Glycemic control for   <7.0% adults with diabetes    Mean Plasma Glucose 99.67 mg/dL    Comment: Performed at [facility] [address], [place], [place] [address]  Glucose, capillary     Status: None   Collection Time: 03/09/22  9:13 PM  Result Value Ref Range   Glucose-Capillary 84 70 - 99 mg/dL    Comment: Glucose reference range applies only to samples taken after fasting for at least 8 hours.  HIV Antibody (routine testing w rflx)     Status: None   Collection Time: 03/10/22  1:03 AM  Result Value Ref Range   HIV Screen 4th Generation wRfx Non Reactive Non Reactive    Comment: Performed at [facility], [place] [address], [place], [place] [address]  Comprehensive metabolic panel     Status: Abnormal   Collection Time: 03/10/22  1:03 AM  Result Value Ref Range   Sodium 141 135 - 145 mmol/L   Potassium 3.4 (L) 3.5 - 5.1 mmol/L   Chloride 110 98 - 111 mmol/L   CO2 26 22 - 32 mmol/L   Glucose, Bld 107 (H) 70 - 99 mg/dL    Comment: Glucose reference range applies only  to samples taken after fasting for at least 8 hours.   BUN 12 8 - 23 mg/dL   Creatinine, Ser 1.15 (H) 0.44 - 1.00 mg/dL   Calcium 8.5 (L) 8.9 - 10.3 mg/dL   Total Protein 5.8 (L) 6.5 - 8.1 g/dL   Albumin 2.7 (L) 3.5 - 5.0 g/dL   AST 14 (L) 15 - 41 U/L   ALT 11 0 - 44 U/L   Alkaline Phosphatase 41 38 - 126 U/L   Total Bilirubin 0.8 0.3 - 1.2 mg/dL   GFR, Estimated 53 (L) >60 mL/min    Comment: (NOTE) Calculated using the CKD-EPI Creatinine Equation (2021)    Anion gap 5 5 - 15    Comment: Performed at [facility], [address] [place],  [address]  CBC     Status: Abnormal   Collection Time: 03/10/22  1:03 AM  Result Value Ref Range   WBC 5.5 4.0 - 10.5 K/uL   RBC 2.64 (L) 3.87 - 5.11 MIL/uL   Hemoglobin 6.5 (L) 12.0 - 15.0 g/dL   HCT 21.8 (L) 36.0 - 46.0 %   MCV 82.6 80.0 - 100.0 fL   MCH 24.6 (L) 26.0 - 34.0 pg   MCHC 29.8 (L) 30.0 - 36.0 g/dL   RDW 19.2 (H) 11.5 - 15.5 %   Platelets 143 (L) 150 - 400 K/uL   nRBC 0.0 0.0 - 0.2 %    Comment: Performed  at [facility], [address] [place], [place] [address]  D-dimer, quantitative     Status: Abnormal   Collection Time: 03/10/22  1:03 AM  Result Value Ref Range   D-Dimer, Quant 0.73 (H) 0.00 - 0.50 ug/mL-FEU    Comment: (NOTE) At the manufacturer cut-off value of 0.5 g/mL FEU, this assay has a negative predictive value of 95-100%.This assay is intended for use in conjunction with a clinical pretest probability (PTP) assessment model to exclude pulmonary embolism (PE) and deep venous thrombosis (DVT) in outpatients suspected of PE or DVT. Results should be correlated with clinical presentation. Performed at [facility], [address] [place], [place] [address]   Urinalysis, Routine w reflex microscopic     Status: Abnormal   Collection Time: 03/10/22  5:20 AM  Result Value Ref Range   Color, Urine YELLOW (A) YELLOW   APPearance HAZY (A) CLEAR   Specific Gravity, Urine 1.010  1.005 - 1.030   pH 7.0 5.0 - 8.0   Glucose, UA NEGATIVE NEGATIVE mg/dL   Hgb urine dipstick SMALL (A) NEGATIVE   Bilirubin Urine NEGATIVE NEGATIVE   Ketones, ur NEGATIVE NEGATIVE mg/dL   Protein, ur >=300 (A) NEGATIVE mg/dL   Nitrite NEGATIVE NEGATIVE   Leukocytes,Ua NEGATIVE NEGATIVE   RBC / HPF 0-5 0 - 5 RBC/hpf   WBC, UA 0-5 0 - 5 WBC/hpf   Bacteria, UA MANY (A) NONE SEEN   Squamous Epithelial / HPF NONE SEEN 0 - 5 /HPF   Mucus PRESENT     Comment: Performed at [facility], [address] [place], [place] [address]  Magnesium     Status: None   Collection Time: 03/10/22  8:10 AM  Result Value Ref Range   Magnesium 1.7 1.7 - 2.4 mg/dL    Comment: Performed at [facility], [address], [place], [place] [address]  Phosphorus     Status: None   Collection Time: 03/10/22  8:10 AM  Result Value Ref Range   Phosphorus 3.4 2.5 - 4.6 mg/dL    Comment: Performed at [facility], [address] [place], [place] [address]  Vitamin B12     Status: None   Collection Time: 03/10/22  8:10 AM  Result Value Ref Range   Vitamin B-12 267 180 - 914 pg/mL    Comment: (NOTE) This assay is not validated for testing neonatal or myeloproliferative syndrome specimens for Vitamin B12 levels. Performed at [facility], [place] [address], [place], [place] [address]   Iron and TIBC     Status: Abnormal   Collection Time: 03/10/22  8:10 AM  Result Value Ref Range   Iron 22 (L) 28 - 170 ug/dL   TIBC 179 (L) 250 - 450 ug/dL   Saturation Ratios 12 10.4 - 31.8 %   UIBC 157 ug/dL    Comment: Performed at [facility], [address] [place], [place] [address]  Folate     Status: None   Collection Time: 03/10/22  8:10 AM  Result Value Ref Range   Folate >40.0 >5.9 ng/mL    Comment: Performed at [facility], [address] [place], [place] [address]  VITAMIN D 25 Hydroxy (Vit-D Deficiency, Fractures)     Status: None   Collection Time:  03/10/22  8:10 AM  Result Value Ref Range   Vit D, 25-Hydroxy 74.06 30 - 100 ng/mL    Comment: (NOTE) Vitamin D deficiency has been defined by the  [place] practice guideline as a level of serum 25-OH  vitamin D less than 20 ng/mL (1,2). The Endocrine Society went on to  further define vitamin D insufficiency as a level between 21 and 29  ng/mL (2).  1. IOM (Institute of Medicine). 2010. Dietary reference intakes for  calcium and D. [place]: [employer]. 2. Holick MF, Binkley [place], Bischoff-Ferrari HA, et al. Evaluation,  treatment, and prevention of vitamin D deficiency: an Endocrine  Society clinical practice guideline, JCEM. 2011 Jul; 96(7): 1911-30.  Performed at [facility], [place] [address] [place], [place] [address]   Uric acid     Status: None   Collection Time: 03/10/22  8:10 AM  Result Value Ref Range   Uric Acid, Serum 4.4 2.5 - 7.1 mg/dL    Comment: Performed at [facility], [address] [place], [place] [address]  CK     Status: None   Collection Time: 03/10/22  8:10 AM  Result Value Ref Range   Total CK 65 38 - 234 U/L    Comment: Performed at [facility], [address] [place], [place] [address]  C-reactive protein     Status: Abnormal   Collection Time: 03/10/22  8:10 AM  Result Value Ref Range   CRP 4.1 (H) <1.0 mg/dL    Comment: Performed at [facility], [place] [address] [place], [place] [address]  Ferritin     Status: None   Collection Time: 03/10/22  8:10 AM  Result Value Ref Range   Ferritin 199 11 - 307 ng/mL    Comment: Performed at [facility], [address] [place], [place] [address]  Glucose, capillary     Status: None   Collection Time: 03/10/22  8:21 AM  Result Value Ref Range   Glucose-Capillary 90 70 - 99 mg/dL    Comment: Glucose reference range applies only to samples taken after fasting for at least 8 hours.  Prepare RBC  (crossmatch)     Status: None   Collection Time: 03/10/22  9:50 AM  Result Value Ref Range   Order Confirmation      ORDER PROCESSED BY BLOOD BANK Performed at [facility], [address] [place], [place] [address]   Type and screen [place]     Status: None   Collection Time: 03/10/22 10:47 AM  Result Value Ref Range   ABO/RH(D) A POS    Antibody Screen NEG    Sample Expiration 03/13/2022,2359    Unit Number [id number]    Blood Component Type RED CELLS,LR    Unit division 00    Status of Unit ISSUED,FINAL    Transfusion Status OK TO TRANSFUSE    Crossmatch Result Compatible    Unit Number [id number]    Blood Component Type RED CELLS,LR    Unit division 00    Status of Unit ISSUED,FINAL    Transfusion Status OK TO TRANSFUSE    Crossmatch Result      Compatible Performed at [facility], [address] [place],  [address]   BPAM RBC     Status: None   Collection Time: 03/10/22 10:47 AM  Result Value Ref Range   ISSUE DATE / TIME [id number]    Blood Product Unit Number [id number]    PRODUCT CODE [id number]    Unit Type and Rh [id number]    Blood Product Expiration Date [id number]  ISSUE DATE / TIME [id number]    Blood Product Unit Number [id number]    PRODUCT CODE [id number]    Unit Type and Rh 6200    Blood Product Expiration Date [id number]   Glucose, capillary     Status: Abnormal   Collection Time: 03/10/22  1:00 PM  Result Value Ref Range   Glucose-Capillary 101 (H) 70 - 99 mg/dL    Comment: Glucose reference range applies only to samples taken after fasting for at least 8 hours.  Glucose, capillary     Status: Abnormal   Collection Time: 03/10/22  5:20 PM  Result Value Ref Range   Glucose-Capillary 115 (H) 70 - 99 mg/dL    Comment: Glucose reference range applies only to samples taken after fasting for at least 8 hours.  Glucose, capillary     Status: Abnormal   Collection Time: 03/10/22  7:50 PM   Result Value Ref Range   Glucose-Capillary 116 (H) 70 - 99 mg/dL    Comment: Glucose reference range applies only to samples taken after fasting for at least 8 hours.  CBC     Status: Abnormal   Collection Time: 03/11/22  6:07 AM  Result Value Ref Range   WBC 3.9 (L) 4.0 - 10.5 K/uL   RBC 2.87 (L) 3.87 - 5.11 MIL/uL   Hemoglobin 7.1 (L) 12.0 - 15.0 g/dL   HCT 23.4 (L) 36.0 - 46.0 %   MCV 81.5 80.0 - 100.0 fL   MCH 24.7 (L) 26.0 - 34.0 pg   MCHC 30.3 30.0 - 36.0 g/dL   RDW 18.5 (H) 11.5 - 15.5 %   Platelets 122 (L) 150 - 400 K/uL   nRBC 0.0 0.0 - 0.2 %    Comment: Performed at [facility], [address], [place], [place] [id number]  Basic metabolic panel     Status: Abnormal   Collection Time: 03/11/22  6:07 AM  Result Value Ref Range   Sodium 140 135 - 145 mmol/L   Potassium 3.7 3.5 - 5.1 mmol/L   Chloride 110 98 - 111 mmol/L   CO2 23 22 - 32 mmol/L   Glucose, Bld 77 70 - 99 mg/dL    Comment: Glucose reference range applies only to samples taken after fasting for at least 8 hours.   BUN 18 8 - 23 mg/dL   Creatinine, Ser 1.43 (H) 0.44 - 1.00 mg/dL   Calcium 8.7 (L) 8.9 - 10.3 mg/dL   GFR, Estimated 41 (L) >60 mL/min    Comment: (NOTE) Calculated using the CKD-EPI Creatinine Equation (2021)    Anion gap 7 5 - 15    Comment: Performed at [facility], [address], [place], [place] [address]  Magnesium     Status: None   Collection Time: 03/11/22  6:07 AM  Result Value Ref Range   Magnesium 1.8 1.7 - 2.4 mg/dL    Comment: Performed at [facility], [address] [place], [place] [address]  Phosphorus     Status: None   Collection Time: 03/11/22  6:07 AM  Result Value Ref Range   Phosphorus 3.5 2.5 - 4.6 mg/dL    Comment: Performed at [facility], [address] [place], [place] [address]  Glucose, capillary     Status: None   Collection Time: 03/11/22  7:23 AM  Result Value Ref Range   Glucose-Capillary 85 70 - 99 mg/dL     Comment: Glucose reference range applies only to samples taken after  fasting for at least 8 hours.  Prepare RBC (crossmatch)     Status: None   Collection Time: 03/11/22 10:30 AM  Result Value Ref Range   Order Confirmation      ORDER PROCESSED BY BLOOD BANK Performed at [facility], [address] [place], [place] [address]   MRSA Next Gen by PCR, Nasal     Status: None   Collection Time: 03/11/22 12:10 PM   Specimen: Nasal Mucosa; Nasal Swab  Result Value Ref Range   MRSA by PCR Next Gen NOT DETECTED NOT DETECTED    Comment: (NOTE) The GeneXpert MRSA Assay (FDA approved for NASAL specimens only), is one component of a comprehensive MRSA colonization surveillance program. It is not intended to diagnose MRSA infection nor to guide or monitor treatment for MRSA infections. Test performance is not FDA approved in patients less than 71 years old. Performed at [facility], [address] [place], [place] [address]   Glucose, capillary     Status: Abnormal   Collection Time: 03/11/22  1:37 PM  Result Value Ref Range   Glucose-Capillary 119 (H) 70 - 99 mg/dL    Comment: Glucose reference range applies only to samples taken after fasting for at least 8 hours.  Glucose, capillary     Status: Abnormal   Collection Time: 03/11/22  3:22 PM  Result Value Ref Range   Glucose-Capillary 116 (H) 70 - 99 mg/dL    Comment: Glucose reference range applies only to samples taken after fasting for at least 8 hours.  Aerobic Culture w Gram Stain (superficial specimen)     Status: None   Collection Time: 03/11/22  8:45 PM   Specimen: Wound  Result Value Ref Range   Specimen Description      WOUND Performed at [facility], [address] [place], [place] [address]    Special Requests      NONE Performed at [facility], [address] [place], [place] [address]    Gram Stain      FEW WBC PRESENT,BOTH PMN AND MONONUCLEAR RARE GRAM POSITIVE COCCI IN  PAIRS    Culture      FEW CORYNEBACTERIUM STRIATUM RARE METHICILLIN RESISTANT STAPHYLOCOCCUS AUREUS RARE STAPHYLOCOCCUS AURICULARIS Standardized susceptibility testing for this organism is not available. FOR CORYNEBACTERIUM STRIATUM Performed at [facility], [place] [address], [place], [place] [address]    Report Status 03/16/2022 FINAL    Organism ID, Bacteria METHICILLIN RESISTANT STAPHYLOCOCCUS AUREUS    Organism ID, Bacteria STAPHYLOCOCCUS AURICULARIS       Susceptibility   Methicillin resistant staphylococcus aureus - MIC*    CIPROFLOXACIN 4 RESISTANT Resistant     ERYTHROMYCIN >=8 RESISTANT Resistant     GENTAMICIN <=0.5 SENSITIVE Sensitive     OXACILLIN >=4 RESISTANT Resistant     TETRACYCLINE <=1 SENSITIVE Sensitive     VANCOMYCIN 1 SENSITIVE Sensitive     TRIMETH/SULFA <=10 SENSITIVE Sensitive     CLINDAMYCIN <=0.25 SENSITIVE Sensitive     RIFAMPIN <=0.5 SENSITIVE Sensitive     Inducible Clindamycin NEGATIVE Sensitive     * RARE METHICILLIN RESISTANT STAPHYLOCOCCUS AUREUS   Staphylococcus auricularis - MIC*    CIPROFLOXACIN 4 RESISTANT Resistant     ERYTHROMYCIN 0.5 SENSITIVE Sensitive     GENTAMICIN <=0.5 SENSITIVE Sensitive     OXACILLIN 0.5 RESISTANT Resistant     TETRACYCLINE <=1 SENSITIVE Sensitive     VANCOMYCIN 1 SENSITIVE Sensitive     TRIMETH/SULFA 80 RESISTANT Resistant  CLINDAMYCIN <=0.25 SENSITIVE Sensitive     RIFAMPIN <=0.5 SENSITIVE Sensitive     Inducible Clindamycin NEGATIVE Sensitive     * RARE STAPHYLOCOCCUS AURICULARIS  Glucose, capillary     Status: Abnormal   Collection Time: 03/11/22  9:28 PM  Result Value Ref Range   Glucose-Capillary 105 (H) 70 - 99 mg/dL    Comment: Glucose reference range applies only to samples taken after fasting for at least 8 hours.  Basic metabolic panel     Status: Abnormal   Collection Time: 03/12/22  3:40 AM  Result Value Ref Range   Sodium 140 135 - 145 mmol/L   Potassium 4.2 3.5 - 5.1 mmol/L    Chloride 110 98 - 111 mmol/L   CO2 23 22 - 32 mmol/L   Glucose, Bld 76 70 - 99 mg/dL    Comment: Glucose reference range applies only to samples taken after fasting for at least 8 hours.   BUN 23 8 - 23 mg/dL   Creatinine, Ser 1.34 (H) 0.44 - 1.00 mg/dL   Calcium 8.6 (L) 8.9 - 10.3 mg/dL   GFR, Estimated 44 (L) >60 mL/min    Comment: (NOTE) Calculated using the CKD-EPI Creatinine Equation (2021)    Anion gap 7 5 - 15    Comment: Performed at [facility], [address] [place], [place] [address]  Magnesium     Status: None   Collection Time: 03/12/22  3:40 AM  Result Value Ref Range   Magnesium 2.0 1.7 - 2.4 mg/dL    Comment: Performed at [facility], [address] [place], [place] [address]  Phosphorus     Status: None   Collection Time: 03/12/22  3:40 AM  Result Value Ref Range   Phosphorus 3.2 2.5 - 4.6 mg/dL    Comment: Performed at [facility], [address] [place], [place] [address]  Glucose, capillary     Status: None   Collection Time: 03/12/22  7:39 AM  Result Value Ref Range   Glucose-Capillary 86 70 - 99 mg/dL    Comment: Glucose reference range applies only to samples taken after fasting for at least 8 hours.  CBC     Status: Abnormal   Collection Time: 03/12/22  9:54 AM  Result Value Ref Range   WBC 5.2 4.0 - 10.5 K/uL   RBC 3.31 (L) 3.87 - 5.11 MIL/uL   Hemoglobin 8.3 (L) 12.0 - 15.0 g/dL   HCT 26.8 (L) 36.0 - 46.0 %   MCV 81.0 80.0 - 100.0 fL   MCH 25.1 (L) 26.0 - 34.0 pg   MCHC 31.0 30.0 - 36.0 g/dL   RDW 18.1 (H) 11.5 - 15.5 %   Platelets 133 (L) 150 - 400 K/uL   nRBC 0.0 0.0 - 0.2 %    Comment: Performed at [facility], [address] [place], [place] [address]  Glucose, capillary     Status: Abnormal   Collection Time: 03/12/22 12:04 PM  Result Value Ref Range   Glucose-Capillary 113 (H) 70 - 99 mg/dL    Comment: Glucose reference range applies only to samples taken after fasting for at least 8 hours.   Glucose, capillary     Status: Abnormal   Collection Time: 03/12/22  5:03 PM  Result Value Ref Range   Glucose-Capillary 133 (H) 70 - 99 mg/dL    Comment: Glucose reference range applies only to samples taken after fasting for at least 8 hours.  Glucose, capillary  Status: Abnormal   Collection Time: 03/12/22  8:58 PM  Result Value Ref Range   Glucose-Capillary 115 (H) 70 - 99 mg/dL    Comment: Glucose reference range applies only to samples taken after fasting for at least 8 hours.  Basic metabolic panel     Status: Abnormal   Collection Time: 03/13/22  5:45 AM  Result Value Ref Range   Sodium 139 135 - 145 mmol/L   Potassium 4.1 3.5 - 5.1 mmol/L   Chloride 111 98 - 111 mmol/L   CO2 21 (L) 22 - 32 mmol/L   Glucose, Bld 118 (H) 70 - 99 mg/dL    Comment: Glucose reference range applies only to samples taken after fasting for at least 8 hours.   BUN 23 8 - 23 mg/dL   Creatinine, Ser 1.25 (H) 0.44 - 1.00 mg/dL   Calcium 8.7 (L) 8.9 - 10.3 mg/dL   GFR, Estimated 48 (L) >60 mL/min    Comment: (NOTE) Calculated using the CKD-EPI Creatinine Equation (2021)    Anion gap 7 5 - 15    Comment: Performed at [facility], [address] [place], [place] [address]  Magnesium     Status: None   Collection Time: 03/13/22  5:45 AM  Result Value Ref Range   Magnesium 1.9 1.7 - 2.4 mg/dL    Comment: Performed at [facility], [address] [place], [place] [address]  Phosphorus     Status: None   Collection Time: 03/13/22  5:45 AM  Result Value Ref Range   Phosphorus 3.2 2.5 - 4.6 mg/dL    Comment: Performed at [facility], [address] [place], [place] [address]  CBC     Status: Abnormal   Collection Time: 03/13/22  6:52 AM  Result Value Ref Range   WBC 5.6 4.0 - 10.5 K/uL   RBC 3.26 (L) 3.87 - 5.11 MIL/uL   Hemoglobin 8.1 (L) 12.0 - 15.0 g/dL   HCT 26.4 (L) 36.0 - 46.0 %   MCV 81.0 80.0 - 100.0 fL   MCH 24.8 (L) 26.0 - 34.0 pg   MCHC 30.7 30.0 -  36.0 g/dL   RDW 18.1 (H) 11.5 - 15.5 %   Platelets 128 (L) 150 - 400 K/uL   nRBC 0.4 (H) 0.0 - 0.2 %    Comment: Performed at [facility], [address] [place],  [address]  Glucose, capillary     Status: None   Collection Time: 03/13/22  7:47 AM  Result Value Ref Range   Glucose-Capillary 95 70 - 99 mg/dL    Comment: Glucose reference range applies only to samples taken after fasting for at least 8 hours.  Glucose, capillary     Status: Abnormal   Collection Time: 03/13/22 11:50 AM  Result Value Ref Range   Glucose-Capillary 108 (H) 70 - 99 mg/dL    Comment: Glucose reference range applies only to samples taken after fasting for at least 8 hours.    Assessment/Plan:  Essential hypertension, benign blood pressure control important in reducing the progression of atherosclerotic disease. On appropriate oral medications.   Diabetes ([place]) blood glucose control important in reducing the progression of atherosclerotic disease. Also, involved in wound healing. On appropriate medications.   CKD (chronic kidney disease) stage 3, GFR 30-59 ml/min (HCC) Renal insufficiency can certainly worsen LE swelling  Morbid obesity (HCC) Her weight and immobility are going to make her leg swelling almost impossible to eliminate.   Lymphedema  The patient has stage III lymphedema with chronic scarring channels.  She has been in [employer] for compression sleeves for almost a year now.  Despite this, she has persistent and recurring ulcerations of the left lower extremity and persistent swelling which is debilitating and disabling.  She has severe hyperpigmentation. She is very immobile and this leg swelling has impaired her exercise ability.  She has been elevating her legs for many months.  She cannot get on traditional compression socks, but she has been in compression [employer] wraps now for almost a year.  Her immobility and weight are going to make eliminating her leg swelling  essentially impossible, but we need to get this under better control to avoid recurrent ulcerations.  I believe a referral to the lymphedema clinic would be of great benefit.  I believe the lymphedema pump would be an excellent option to try to improve her swelling.  Will try to get this approved in the near future.  Will see her back in a few months.  We discussed the importance of trying to keep her legs elevated and wrapped is much as possible.       Leotis Pain 04/21/2022, 4:23 PM   This note was created with Dragon medical transcription system.  Any errors from dictation are unintentional.

## 2022-02-24 NOTE — Assessment & Plan Note (Signed)
blood glucose control important in reducing the progression of atherosclerotic disease. Also, involved in wound healing. On appropriate medications.  

## 2022-02-24 NOTE — Assessment & Plan Note (Signed)
Renal insufficiency can certainly worsen LE swelling

## 2022-02-25 MED FILL — Iron Sucrose Inj 20 MG/ML (Fe Equiv): INTRAVENOUS | Qty: 10 | Status: AC

## 2022-02-26 ENCOUNTER — Inpatient Hospital Stay: Payer: Medicare HMO

## 2022-02-26 ENCOUNTER — Inpatient Hospital Stay: Payer: Medicare HMO | Attending: Oncology

## 2022-02-26 ENCOUNTER — Encounter: Payer: Self-pay | Admitting: Oncology

## 2022-02-26 ENCOUNTER — Inpatient Hospital Stay (HOSPITAL_BASED_OUTPATIENT_CLINIC_OR_DEPARTMENT_OTHER): Payer: Medicare HMO | Admitting: Oncology

## 2022-02-26 VITALS — BP 182/70 | HR 62 | Temp 98.5°F | Resp 18

## 2022-02-26 DIAGNOSIS — D472 Monoclonal gammopathy: Secondary | ICD-10-CM

## 2022-02-26 DIAGNOSIS — I10 Essential (primary) hypertension: Secondary | ICD-10-CM

## 2022-02-26 DIAGNOSIS — N183 Chronic kidney disease, stage 3 unspecified: Secondary | ICD-10-CM | POA: Diagnosis not present

## 2022-02-26 DIAGNOSIS — D631 Anemia in chronic kidney disease: Secondary | ICD-10-CM

## 2022-02-26 DIAGNOSIS — N1831 Chronic kidney disease, stage 3a: Secondary | ICD-10-CM | POA: Diagnosis not present

## 2022-02-26 DIAGNOSIS — E611 Iron deficiency: Secondary | ICD-10-CM | POA: Insufficient documentation

## 2022-02-26 DIAGNOSIS — D649 Anemia, unspecified: Secondary | ICD-10-CM

## 2022-02-26 LAB — CBC WITH DIFFERENTIAL/PLATELET
Abs Immature Granulocytes: 0.07 10*3/uL (ref 0.00–0.07)
Basophils Absolute: 0 10*3/uL (ref 0.0–0.1)
Basophils Relative: 1 %
Eosinophils Absolute: 0.1 10*3/uL (ref 0.0–0.5)
Eosinophils Relative: 2 %
HCT: 24.7 % — ABNORMAL LOW (ref 36.0–46.0)
Hemoglobin: 7.5 g/dL — ABNORMAL LOW (ref 12.0–15.0)
Immature Granulocytes: 1 %
Lymphocytes Relative: 12 %
Lymphs Abs: 0.7 10*3/uL (ref 0.7–4.0)
MCH: 24.4 pg — ABNORMAL LOW (ref 26.0–34.0)
MCHC: 30.4 g/dL (ref 30.0–36.0)
MCV: 80.5 fL (ref 80.0–100.0)
Monocytes Absolute: 0.4 10*3/uL (ref 0.1–1.0)
Monocytes Relative: 8 %
Neutro Abs: 4.5 10*3/uL (ref 1.7–7.7)
Neutrophils Relative %: 76 %
Platelets: 164 10*3/uL (ref 150–400)
RBC: 3.07 MIL/uL — ABNORMAL LOW (ref 3.87–5.11)
RDW: 19.3 % — ABNORMAL HIGH (ref 11.5–15.5)
WBC: 5.8 10*3/uL (ref 4.0–10.5)
nRBC: 0 % (ref 0.0–0.2)

## 2022-02-26 LAB — SAMPLE TO BLOOD BANK

## 2022-02-26 LAB — RETIC PANEL
Immature Retic Fract: 13.4 % (ref 2.3–15.9)
RBC.: 3.11 MIL/uL — ABNORMAL LOW (ref 3.87–5.11)
Retic Count, Absolute: 53.8 10*3/uL (ref 19.0–186.0)
Retic Ct Pct: 1.7 % (ref 0.4–3.1)
Reticulocyte Hemoglobin: 22 pg — ABNORMAL LOW (ref >27.9)

## 2022-02-26 NOTE — Progress Notes (Signed)
Hematology/Oncology Consult note [phone] [phone]      Patient Care Team: Mechele Claude, FNP as PCP - General (Family Medicine)   REFERRING PROVIDER: Mechele Claude, FNP  CHIEF COMPLAINTS/REASON FOR VISIT:  Anemia  ASSESSMENT & PLAN:  Anemia in chronic kidney disease (CKD) S/p IV Venofer she tolerates well. Labs are reviewed and discussed with patient.  Slight improvement of hemoglobin to 7.5.  Reticulocyte hemoglobin  is still decreased. Recommend additional Venofer treatments. Today BP is elevated.  Will hold off Venofer treatments and reschedule for next week. I also recommend patient to proceed with Retacrit next week given that hemoglobin is persistently less than 10.  Rationale potential side effects were reviewed and discussed with patient.  CKD (chronic kidney disease) stage 3, GFR 30-59 ml/min (HCC) Encourage oral hydration, avoid nephrotoxins.    MGUS (monoclonal gammopathy of unknown significance) Labs reviewed and discussed with patient. 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 Patient has severe anemia, CKD.  I recommend a bone marrow biopsy for further evaluation.  Patient is undecided and she wants to think about that and update me.  Essential hypertension, benign Patient reports history of whitecoat syndrome.  Orders Placed This Encounter  Procedures   CBC with Differential/Platelet    Standing Status:   Future    Standing Expiration Date:   02/27/2023   CBC with Differential/Platelet    Standing Status:   Future    Standing Expiration Date:   02/27/2023   Iron and TIBC    Standing Status:   Future    Standing Expiration Date:   02/27/2023   Ferritin    Standing Status:   Future    Standing Expiration Date:   02/27/2023   Retic Panel    Standing Status:   Future    Standing Expiration Date:   02/27/2023   Sample to Blood Bank    Standing Status:   Future    Standing Expiration Date:   02/27/2023   Sample to Blood Bank     Standing Status:   Future    Standing Expiration Date:   02/27/2023   Follow-up in 4 weeks. All questions were answered. The patient knows to call the clinic with any problems, questions or concerns.  Earlie Server, MD, PhD [facility] Hematology Oncology 02/26/2022     HISTORY OF PRESENTING ILLNESS:  Veronica Krueger is a  67 y.o.  female with PMH listed below who was referred to me for anemia Reviewed patient's recent labs that was done.  She was found to have abnormal CBC on 01/22/22 Hemoglobin of 7.2 Reviewed patient's previous labs ordered, anemia is chronic onset No aggravating or improving factors.  She reports feeling very tired and fatigued.  She had not noticed any recent bleeding such as epistaxis, hematuria or hematochezia.  She denies over the counter NSAID ingestion. She takes Celecoxib sometimes.  Overdue for colonoscopy  INTERVAL HISTORY Veronica Krueger is a 67 y.o. female who has above history reviewed by me today presents for follow up visit for anemia. Patient has tolerated IV Venofer treatments.  Status post 2 doses, so dose was not given due to infiltrated IV access. Today patient reports no new complaints.  Chronic fatigue. BP was high in the clinic.  She reports a history of whitecoat syndrome.  She is asymptomatic.  MEDICAL HISTORY:  Past Medical History:  Diagnosis Date   Anemia    Diabetes mellitus without complication ([place])    type 2  Hypertension    Left hand weakness    related to previous stomach abscess surgery    Lymph edema    legs    Migraine     SURGICAL HISTORY: Past Surgical History:  Procedure Laterality Date   bilateral cataract surgery      history of stomach surgery      due to left sided abscess of stomach - 2010    LAPAROSCOPIC GASTRIC SLEEVE RESECTION N/A 02/08/2018   Procedure: LAPAROSCOPIC GASTRIC SLEEVE RESECTION WITH HIATAL HERNIA REPAIR AND UPPER ENDO, ERAS Pathway;  Surgeon: Johnathan Hausen, MD;  Location: [facility];   Service: General;  Laterality: N/A;    SOCIAL HISTORY: Social History   Socioeconomic History   Marital status: Single    Spouse name: Not on file   Number of children: Not on file   Years of education: Not on file   Highest education level: Not on file  Occupational History   Not on file  Tobacco Use   Smoking status: Never   Smokeless tobacco: Never  Vaping Use   Vaping Use: Never used  Substance and Sexual Activity   Alcohol use: Never   Drug use: Never   Sexual activity: Not on file  Other Topics Concern   Not on file  Social History Narrative   Lives in [place]; with self; used in health care field/disbaled; no smoking or alcohol.   Social Determinants of Health   Financial Resource Strain: Not on file  Food Insecurity: Not on file  Transportation Needs: Not on file  Physical Activity: Not on file  Stress: Not on file  Social Connections: Not on file  Intimate Partner Violence: Not on file    FAMILY HISTORY: Family History  Problem Relation Age of Onset   Cancer Sister    Diabetes Sister     ALLERGIES:  is allergic to penicillins and gabapentin.  MEDICATIONS:  Current Outpatient Medications  Medication Sig Dispense Refill   acetaminophen (TYLENOL) 500 MG tablet Take 1,000 mg by mouth every 6 (six) hours as needed for moderate pain or headache.      allopurinol (ZYLOPRIM) 100 MG tablet Take 100 mg by mouth daily.     b complex vitamins capsule Take 1 capsule by mouth daily.     calcium carbonate (OS-CAL) 1250 (500 Ca) MG chewable tablet Chew 1 tablet by mouth 3 (three) times daily.     celecoxib (CELEBREX) 200 MG capsule Take 200 mg by mouth 2 (two) times daily.     dorzolamide (TRUSOPT) 2 % ophthalmic solution Place 1 drop into the right eye 2 (two) times daily.   99   ferrous sulfate 325 (65 FE) MG tablet Take 325 mg by mouth daily.     glucose blood test strip 1 each by Other route as needed for other. Use as instructed     latanoprost (XALATAN)  0.005 % ophthalmic solution Place 1 drop into the right eye at bedtime.      metoprolol tartrate (LOPRESSOR) 25 MG tablet Take 25 mg by mouth 2 (two) times daily.     Multiple Vitamin (MULTIVITAMIN) tablet Take 1 tablet by mouth daily.     timolol (TIMOPTIC) 0.5 % ophthalmic solution Place 1 drop into both eyes 2 (two) times daily.   4   Vitamin D, Ergocalciferol, (DRISDOL) 1.25 MG (50000 UT) CAPS capsule Take 50,000 Units by mouth once a week.     Erenumab-aooe (AIMOVIG, 140 MG DOSE,) 70 MG/ML SOAJ Inject 140 mg into  the skin every 28 (twenty-eight) days. (Patient not taking: Reported on 02/04/2022)     rosuvastatin (CRESTOR) 10 MG tablet Take 10 mg by mouth every evening. Patient admits to noncompliance with this medication (Patient not taking: Reported on 02/04/2022)     topiramate (TOPAMAX) 25 MG tablet Take 25 mg by mouth 2 (two) times daily. (Patient not taking: Reported on 02/04/2022)     vitamin B-12 1000 MCG tablet Take 1 tablet (1,000 mcg total) by mouth daily. (Patient not taking: Reported on 02/04/2022) 30 tablet 0   No current facility-administered medications for this visit.    Review of Systems  Constitutional:  Positive for fatigue. Negative for appetite change, chills and fever.  HENT:   Negative for hearing loss and voice change.   Eyes:  Negative for eye problems.  Respiratory:  Negative for chest tightness and cough.   Cardiovascular:  Positive for leg swelling. Negative for chest pain.  Gastrointestinal:  Negative for abdominal distention, abdominal pain and blood in stool.  Endocrine: Negative for hot flashes.  Genitourinary:  Negative for difficulty urinating and frequency.   Musculoskeletal:  Negative for arthralgias.  Skin:  Negative for itching and rash.  Neurological:  Negative for extremity weakness.  Hematological:  Negative for adenopathy.  Psychiatric/Behavioral:  Negative for confusion.     PHYSICAL EXAMINATION: ECOG PERFORMANCE STATUS: 2 - Symptomatic,  <50% confined to bed Vitals:   02/26/22 1448  BP: (!) 182/70  Pulse: 62  Resp: 18  Temp: 98.5 F (36.9 C)   Filed Weights    Physical Exam Constitutional:      General: She is not in acute distress.    Appearance: She is obese.  HENT:     Head: Normocephalic and atraumatic.  Eyes:     General: No scleral icterus. Neck:     Comments: thyroidmegaly Cardiovascular:     Rate and Rhythm: Normal rate and regular rhythm.     Heart sounds: Normal heart sounds.  Pulmonary:     Effort: Pulmonary effort is normal. No respiratory distress.     Breath sounds: No wheezing.  Abdominal:     General: Bowel sounds are normal. There is no distension.     Palpations: Abdomen is soft.  Musculoskeletal:        General: No deformity. Normal range of motion.     Cervical back: Normal range of motion and neck supple.     Right lower leg: Edema present.     Left lower leg: Edema present.  Skin:    General: Skin is warm and dry.     Findings: No erythema or rash.  Neurological:     Mental Status: She is alert and oriented to person, place, and time. Mental status is at baseline.     Cranial Nerves: No cranial nerve deficit.     Coordination: Coordination normal.  Psychiatric:        Mood and Affect: Mood normal.      LABORATORY DATA:  I have reviewed the data as listed    Latest Ref Rng & Units 02/26/2022    2:13 PM 02/04/2022    4:01 PM 01/09/2021    5:39 AM  CBC  WBC 4.0 - 10.5 K/uL 5.8  6.2  4.8   Hemoglobin 12.0 - 15.0 g/dL 7.5  7.0  8.2   Hematocrit 36.0 - 46.0 % 24.7  23.6  26.9   Platelets 150 - 400 K/uL 164  167  197  Latest Ref Rng & Units 02/04/2022    4:01 PM 01/09/2021    5:39 AM 01/08/2021    5:09 AM  CMP  Glucose 70 - 99 mg/dL 119  98  99   BUN 8 - 23 mg/dL 25  41  38   Creatinine 0.44 - 1.00 mg/dL 1.07  2.00  2.42   Sodium 135 - 145 mmol/L 141  140  141   Potassium 3.5 - 5.1 mmol/L 3.6  4.3  4.3   Chloride 98 - 111 mmol/L 113  110  113   CO2 22 - 32  mmol/L _0 Calcium 8.9 - 10.3 mg/dL 8.5  8.7  8.5   Total Protein 6.5 - 8.1 g/dL 7.2     Total Bilirubin 0.3 - 1.2 mg/dL 0.4     Alkaline Phos 38 - 126 U/L 59     AST 15 - 41 U/L 21     ALT 0 - 44 U/L 13         Component Value Date/Time   IRON 26 (L) 02/04/2022 1601   TIBC 239 (L) 02/04/2022 1601   FERRITIN 103 02/04/2022 1601   IRONPCTSAT 11 02/04/2022 1601     RADIOGRAPHIC STUDIES: I have personally reviewed the radiological images as listed and agreed with the findings in the report. No results found.

## 2022-02-26 NOTE — Progress Notes (Signed)
Pt here for follow up. BP 182/70

## 2022-02-26 NOTE — Assessment & Plan Note (Signed)
Encourage oral hydration, avoid nephrotoxins 

## 2022-02-26 NOTE — Assessment & Plan Note (Addendum)
S/p IV Venofer she tolerates well. Labs are reviewed and discussed with patient.  Slight improvement of hemoglobin to 7.5.  Reticulocyte hemoglobin  is still decreased. Recommend additional Venofer treatments. Today BP is elevated.  Will hold off Venofer treatments and reschedule for next week. I also recommend patient to proceed with Retacrit next week given that hemoglobin is persistently less than 10.  Rationale potential side effects were reviewed and discussed with patient.

## 2022-02-26 NOTE — Assessment & Plan Note (Signed)
Patient reports history of whitecoat syndrome.

## 2022-02-26 NOTE — Assessment & Plan Note (Signed)
Labs reviewed and discussed with patient. 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 Patient has severe anemia, CKD.  I recommend a bone marrow biopsy for further evaluation.  Patient is undecided and she wants to think about that and update me.

## 2022-02-27 ENCOUNTER — Telehealth: Payer: Self-pay | Admitting: Oncology

## 2022-02-27 NOTE — Telephone Encounter (Signed)
pt called to have appt cancelled. pt confirmed appt during scheduling. pt states that she will give [place] a call back to r/s appts

## 2022-03-02 ENCOUNTER — Inpatient Hospital Stay: Payer: Medicare HMO

## 2022-03-02 VITALS — BP 127/72 | HR 65 | Temp 96.8°F | Resp 18

## 2022-03-02 DIAGNOSIS — N1831 Chronic kidney disease, stage 3a: Secondary | ICD-10-CM | POA: Diagnosis not present

## 2022-03-02 DIAGNOSIS — E611 Iron deficiency: Secondary | ICD-10-CM | POA: Diagnosis not present

## 2022-03-02 DIAGNOSIS — Z9884 Bariatric surgery status: Secondary | ICD-10-CM

## 2022-03-02 DIAGNOSIS — D631 Anemia in chronic kidney disease: Secondary | ICD-10-CM | POA: Diagnosis not present

## 2022-03-02 MED ORDER — SODIUM CHLORIDE 0.9 % IV SOLN
200.0000 mg | Freq: Once | INTRAVENOUS | Status: AC
  Administered 2022-03-02: 200 mg via INTRAVENOUS
  Filled 2022-03-02: qty 200

## 2022-03-02 MED ORDER — SODIUM CHLORIDE 0.9 % IV SOLN
Freq: Once | INTRAVENOUS | Status: AC
  Filled 2022-03-02: qty 250

## 2022-03-02 NOTE — Patient Instructions (Signed)

## 2022-03-05 ENCOUNTER — Ambulatory Visit: Payer: Medicare HMO

## 2022-03-05 ENCOUNTER — Other Ambulatory Visit: Payer: Medicare HMO

## 2022-03-05 DIAGNOSIS — R1900 Intra-abdominal and pelvic swelling, mass and lump, unspecified site: Secondary | ICD-10-CM | POA: Diagnosis not present

## 2022-03-05 DIAGNOSIS — I10 Essential (primary) hypertension: Secondary | ICD-10-CM | POA: Diagnosis not present

## 2022-03-05 DIAGNOSIS — L03116 Cellulitis of left lower limb: Secondary | ICD-10-CM | POA: Diagnosis not present

## 2022-03-05 DIAGNOSIS — E1165 Type 2 diabetes mellitus with hyperglycemia: Secondary | ICD-10-CM | POA: Diagnosis not present

## 2022-03-05 DIAGNOSIS — S81802D Unspecified open wound, left lower leg, subsequent encounter: Secondary | ICD-10-CM | POA: Diagnosis not present

## 2022-03-05 DIAGNOSIS — E785 Hyperlipidemia, unspecified: Secondary | ICD-10-CM | POA: Diagnosis not present

## 2022-03-09 ENCOUNTER — Inpatient Hospital Stay
Admission: EM | Admit: 2022-03-09 | Discharge: 2022-03-13 | DRG: 603 | Disposition: A | Discharge status: Home Health | Payer: Medicare HMO | Attending: Student | Admitting: Student

## 2022-03-09 ENCOUNTER — Emergency Department: Payer: Medicare HMO

## 2022-03-09 ENCOUNTER — Other Ambulatory Visit: Payer: Self-pay

## 2022-03-09 DIAGNOSIS — Z993 Dependence on wheelchair: Secondary | ICD-10-CM

## 2022-03-09 DIAGNOSIS — Z888 Allergy status to other drugs, medicaments and biological substances status: Secondary | ICD-10-CM

## 2022-03-09 DIAGNOSIS — Z809 Family history of malignant neoplasm, unspecified: Secondary | ICD-10-CM | POA: Diagnosis not present

## 2022-03-09 DIAGNOSIS — D649 Anemia, unspecified: Secondary | ICD-10-CM | POA: Diagnosis present

## 2022-03-09 DIAGNOSIS — I890 Lymphedema, not elsewhere classified: Secondary | ICD-10-CM | POA: Diagnosis not present

## 2022-03-09 DIAGNOSIS — E785 Hyperlipidemia, unspecified: Secondary | ICD-10-CM | POA: Diagnosis present

## 2022-03-09 DIAGNOSIS — I129 Hypertensive chronic kidney disease with stage 1 through stage 4 chronic kidney disease, or unspecified chronic kidney disease: Secondary | ICD-10-CM | POA: Diagnosis present

## 2022-03-09 DIAGNOSIS — L089 Local infection of the skin and subcutaneous tissue, unspecified: Secondary | ICD-10-CM | POA: Diagnosis not present

## 2022-03-09 DIAGNOSIS — R531 Weakness: Secondary | ICD-10-CM | POA: Diagnosis not present

## 2022-03-09 DIAGNOSIS — Z880 Allergy status to penicillin: Secondary | ICD-10-CM

## 2022-03-09 DIAGNOSIS — L03116 Cellulitis of left lower limb: Principal | ICD-10-CM

## 2022-03-09 DIAGNOSIS — E1122 Type 2 diabetes mellitus with diabetic chronic kidney disease: Secondary | ICD-10-CM | POA: Diagnosis not present

## 2022-03-09 DIAGNOSIS — R7989 Other specified abnormal findings of blood chemistry: Secondary | ICD-10-CM | POA: Diagnosis not present

## 2022-03-09 DIAGNOSIS — M79661 Pain in right lower leg: Secondary | ICD-10-CM | POA: Diagnosis not present

## 2022-03-09 DIAGNOSIS — M6281 Muscle weakness (generalized): Secondary | ICD-10-CM

## 2022-03-09 DIAGNOSIS — Z833 Family history of diabetes mellitus: Secondary | ICD-10-CM | POA: Diagnosis not present

## 2022-03-09 DIAGNOSIS — I10 Essential (primary) hypertension: Secondary | ICD-10-CM | POA: Diagnosis not present

## 2022-03-09 DIAGNOSIS — N189 Chronic kidney disease, unspecified: Secondary | ICD-10-CM | POA: Diagnosis not present

## 2022-03-09 DIAGNOSIS — Z79899 Other long term (current) drug therapy: Secondary | ICD-10-CM

## 2022-03-09 DIAGNOSIS — I440 Atrioventricular block, first degree: Secondary | ICD-10-CM | POA: Diagnosis present

## 2022-03-09 DIAGNOSIS — Z6841 Body Mass Index (BMI) 40.0 and over, adult: Secondary | ICD-10-CM | POA: Diagnosis not present

## 2022-03-09 DIAGNOSIS — Z743 Need for continuous supervision: Secondary | ICD-10-CM | POA: Diagnosis not present

## 2022-03-09 DIAGNOSIS — R600 Localized edema: Secondary | ICD-10-CM | POA: Diagnosis not present

## 2022-03-09 LAB — CBC WITH DIFFERENTIAL/PLATELET
Abs Immature Granulocytes: 0.02 10*3/uL (ref 0.00–0.07)
Basophils Absolute: 0 10*3/uL (ref 0.0–0.1)
Basophils Relative: 0 %
Eosinophils Absolute: 0.1 10*3/uL (ref 0.0–0.5)
Eosinophils Relative: 1 %
HCT: 24.9 % — ABNORMAL LOW (ref 36.0–46.0)
Hemoglobin: 7.4 g/dL — ABNORMAL LOW (ref 12.0–15.0)
Immature Granulocytes: 0 %
Lymphocytes Relative: 11 %
Lymphs Abs: 0.7 10*3/uL (ref 0.7–4.0)
MCH: 24.7 pg — ABNORMAL LOW (ref 26.0–34.0)
MCHC: 29.7 g/dL — ABNORMAL LOW (ref 30.0–36.0)
MCV: 83 fL (ref 80.0–100.0)
Monocytes Absolute: 0.6 10*3/uL (ref 0.1–1.0)
Monocytes Relative: 9 %
Neutro Abs: 5.2 10*3/uL (ref 1.7–7.7)
Neutrophils Relative %: 79 %
Platelets: 180 10*3/uL (ref 150–400)
RBC: 3 MIL/uL — ABNORMAL LOW (ref 3.87–5.11)
RDW: 19.4 % — ABNORMAL HIGH (ref 11.5–15.5)
WBC: 6.6 10*3/uL (ref 4.0–10.5)
nRBC: 0 % (ref 0.0–0.2)

## 2022-03-09 LAB — COMPREHENSIVE METABOLIC PANEL
ALT: 11 U/L (ref 0–44)
AST: 14 U/L — ABNORMAL LOW (ref 15–41)
Albumin: 3.1 g/dL — ABNORMAL LOW (ref 3.5–5.0)
Alkaline Phosphatase: 53 U/L (ref 38–126)
Anion gap: 5 (ref 5–15)
BUN: 14 mg/dL (ref 8–23)
CO2: 27 mmol/L (ref 22–32)
Calcium: 9.1 mg/dL (ref 8.9–10.3)
Chloride: 111 mmol/L (ref 98–111)
Creatinine, Ser: 1.08 mg/dL — ABNORMAL HIGH (ref 0.44–1.00)
GFR, Estimated: 57 mL/min — ABNORMAL LOW (ref >60)
Glucose, Bld: 90 mg/dL (ref 70–99)
Potassium: 4 mmol/L (ref 3.5–5.1)
Sodium: 143 mmol/L (ref 135–145)
Total Bilirubin: 0.9 mg/dL (ref 0.3–1.2)
Total Protein: 6.7 g/dL (ref 6.5–8.1)

## 2022-03-09 LAB — GLUCOSE, CAPILLARY: Glucose-Capillary: 84 mg/dL (ref 70–99)

## 2022-03-09 LAB — TSH: TSH: 1.328 u[IU]/mL (ref 0.350–4.500)

## 2022-03-09 MED ORDER — ACETAMINOPHEN 650 MG RE SUPP: 650.0000 mg | Freq: Four times a day (QID) | RECTAL | Status: DC | PRN

## 2022-03-09 MED ORDER — ADULT MULTIVITAMIN W/MINERALS CH
1.0000 | ORAL_TABLET | Freq: Every day | ORAL | Status: DC
  Administered 2022-03-09 – 2022-03-13 (×5): 1 via ORAL
  Filled 2022-03-09 (×5): qty 1

## 2022-03-09 MED ORDER — RENA-VITE PO TABS
1.0000 | ORAL_TABLET | Freq: Every day | ORAL | Status: DC
  Administered 2022-03-09 – 2022-03-11 (×3): 1 via ORAL
  Filled 2022-03-09 (×3): qty 1

## 2022-03-09 MED ORDER — DORZOLAMIDE HCL 2 % OP SOLN
1.0000 [drp] | Freq: Two times a day (BID) | OPHTHALMIC | Status: DC
  Administered 2022-03-09 – 2022-03-13 (×8): 1 [drp] via OPHTHALMIC
  Filled 2022-03-09: qty 10

## 2022-03-09 MED ORDER — METOPROLOL TARTRATE 25 MG PO TABS
25.0000 mg | ORAL_TABLET | Freq: Two times a day (BID) | ORAL | Status: DC
  Administered 2022-03-09 – 2022-03-13 (×6): 25 mg via ORAL
  Filled 2022-03-09 (×8): qty 1

## 2022-03-09 MED ORDER — LATANOPROST 0.005 % OP SOLN
1.0000 [drp] | Freq: Every day | OPHTHALMIC | Status: DC
  Administered 2022-03-09 – 2022-03-12 (×4): 1 [drp] via OPHTHALMIC
  Filled 2022-03-09: qty 2.5

## 2022-03-09 MED ORDER — INSULIN ASPART 100 UNIT/ML IJ SOLN
0.0000 [IU] | Freq: Three times a day (TID) | INTRAMUSCULAR | Status: DC
  Filled 2022-03-09: qty 1

## 2022-03-09 MED ORDER — SODIUM CHLORIDE 0.9 % IV SOLN
1.0000 g | Freq: Once | INTRAVENOUS | Status: AC
  Administered 2022-03-09: 1 g via INTRAVENOUS
  Filled 2022-03-09: qty 10

## 2022-03-09 MED ORDER — ALLOPURINOL 100 MG PO TABS
100.0000 mg | ORAL_TABLET | Freq: Every day | ORAL | Status: DC
  Administered 2022-03-09 – 2022-03-13 (×5): 100 mg via ORAL
  Filled 2022-03-09 (×5): qty 1

## 2022-03-09 MED ORDER — FERROUS SULFATE 325 (65 FE) MG PO TABS
325.0000 mg | ORAL_TABLET | Freq: Every day | ORAL | Status: DC
  Administered 2022-03-09 – 2022-03-13 (×5): 325 mg via ORAL
  Filled 2022-03-09 (×5): qty 1

## 2022-03-09 MED ORDER — MORPHINE SULFATE (PF) 2 MG/ML IV SOLN: 1.0000 mg | Freq: Four times a day (QID) | INTRAVENOUS | Status: DC | PRN

## 2022-03-09 MED ORDER — CELECOXIB 200 MG PO CAPS
200.0000 mg | ORAL_CAPSULE | Freq: Two times a day (BID) | ORAL | Status: DC
  Administered 2022-03-09: 200 mg via ORAL
  Filled 2022-03-09 (×2): qty 1

## 2022-03-09 MED ORDER — VANCOMYCIN HCL IN DEXTROSE 1-5 GM/200ML-% IV SOLN
1000.0000 mg | Freq: Once | INTRAVENOUS | Status: AC
  Administered 2022-03-09: 1000 mg via INTRAVENOUS
  Filled 2022-03-09: qty 200

## 2022-03-09 MED ORDER — SODIUM CHLORIDE 0.9% FLUSH
3.0000 mL | Freq: Two times a day (BID) | INTRAVENOUS | Status: DC
  Administered 2022-03-09 – 2022-03-13 (×8): 3 mL via INTRAVENOUS

## 2022-03-09 MED ORDER — SENNOSIDES-DOCUSATE SODIUM 8.6-50 MG PO TABS: 1.0000 | ORAL_TABLET | Freq: Every evening | ORAL | Status: DC | PRN

## 2022-03-09 MED ORDER — ENOXAPARIN SODIUM 80 MG/0.8ML IJ SOSY
0.5000 mg/kg | PREFILLED_SYRINGE | INTRAMUSCULAR | Status: DC
  Administered 2022-03-09 – 2022-03-12 (×4): 72.5 mg via SUBCUTANEOUS
  Filled 2022-03-09 (×4): qty 0.72

## 2022-03-09 MED ORDER — TRAZODONE HCL 50 MG PO TABS
25.0000 mg | ORAL_TABLET | Freq: Every evening | ORAL | Status: DC | PRN
  Filled 2022-03-09: qty 1

## 2022-03-09 MED ORDER — ONDANSETRON HCL 4 MG PO TABS: 4.0000 mg | ORAL_TABLET | Freq: Four times a day (QID) | ORAL | Status: DC | PRN

## 2022-03-09 MED ORDER — INSULIN ASPART 100 UNIT/ML IJ SOLN: 0.0000 [IU] | Freq: Every day | INTRAMUSCULAR | Status: DC

## 2022-03-09 MED ORDER — ONDANSETRON HCL 4 MG/2ML IJ SOLN: 4.0000 mg | Freq: Four times a day (QID) | INTRAMUSCULAR | Status: DC | PRN

## 2022-03-09 MED ORDER — TIMOLOL MALEATE 0.5 % OP SOLN
1.0000 [drp] | Freq: Two times a day (BID) | OPHTHALMIC | Status: DC
  Administered 2022-03-09 – 2022-03-13 (×9): 1 [drp] via OPHTHALMIC
  Filled 2022-03-09: qty 5

## 2022-03-09 MED ORDER — BISACODYL 5 MG PO TBEC: 5.0000 mg | DELAYED_RELEASE_TABLET | Freq: Every day | ORAL | Status: DC | PRN

## 2022-03-09 MED ORDER — HYDRALAZINE HCL 20 MG/ML IJ SOLN: 5.0000 mg | Freq: Four times a day (QID) | INTRAMUSCULAR | Status: DC | PRN

## 2022-03-09 MED ORDER — HYDROCODONE-ACETAMINOPHEN 5-325 MG PO TABS
1.0000 | ORAL_TABLET | ORAL | Status: DC | PRN
  Administered 2022-03-09 – 2022-03-13 (×6): 2 via ORAL
  Filled 2022-03-09 (×6): qty 2

## 2022-03-09 MED ORDER — CALCIUM CARBONATE ANTACID 500 MG PO CHEW
1250.0000 mg | CHEWABLE_TABLET | Freq: Three times a day (TID) | ORAL | Status: DC
  Administered 2022-03-09: 1250 mg via ORAL
  Filled 2022-03-09 (×2): qty 7

## 2022-03-09 MED ORDER — ACETAMINOPHEN 325 MG PO TABS: 650.0000 mg | ORAL_TABLET | Freq: Four times a day (QID) | ORAL | Status: DC | PRN

## 2022-03-09 NOTE — ED Triage Notes (Signed)
First Nurse: Pt here with a left thigh wound that is being followed by wound care. Pt was not able to get out of bed on her own like she usually can. Pt was started on abx on Fri and was told to come to the ED if the smell got worse.

## 2022-03-09 NOTE — ED Provider Notes (Signed)
[facility] Provider Note    Event Date/Time   First MD Initiated Contact with Patient 03/09/22 1606     (approximate)   History   Chief Complaint Weakness   HPI  Veronica Krueger is a 67 y.o. female with past medical history of hypertension, diabetes, CKD, anemia, MGUS, and lymphedema who presents to the ED complaining of weakness.  Patient reports that she woke up this morning feeling profoundly weak and unable to get out of bed.  She lives by herself and is typically able to transition from bed to her wheelchair, which she uses to get around.  She states she has been recently dealing with an infected wound to her left lower leg, was started on Levaquin by her PCP 3 days ago.  She states that the wound has not improved and continues to have a foul odor with purulent drainage.  She has not had any fevers and denies any nausea, vomiting, diarrhea, chest pain, or shortness of breath.     Physical Exam   Triage Vital Signs: ED Triage Vitals  Enc Vitals Group     BP 03/09/22 1545 (!) 159/91     Pulse Rate 03/09/22 1545 76     Resp 03/09/22 1545 17     Temp 03/09/22 1545 98.5 F (36.9 C)     Temp Source 03/09/22 1545 Oral     SpO2 03/09/22 1545 100 %     Weight --      Height --      Head Circumference --      Peak Flow --      Pain Score 03/09/22 1524 8     Pain Loc --      Pain Edu? --      Excl. in [place]? --     Most recent vital signs: Vitals:   03/09/22 1615 03/09/22 1630  BP: (!) 143/67   Pulse: 75 73  Resp:  18  Temp:    SpO2: 99% 97%    Constitutional: Alert and oriented. Eyes: Conjunctivae are normal. Head: Atraumatic. Nose: No congestion/rhinnorhea. Mouth/Throat: Mucous membranes are moist.  Neck: Anterior neck edema, suspect enlarged thyroid. Cardiovascular: Normal rate, regular rhythm. Grossly normal heart sounds.  2+ radial and DP pulses bilaterally. Respiratory: Normal respiratory effort.  No retractions. Lungs  CTAB. Gastrointestinal: Soft and nontender. No distention. Musculoskeletal: Open wound to left lateral lower leg with surrounding erythema, warmth, and tenderness to palpation, central purulence noted. Neurologic:  Normal speech and language. No gross focal neurologic deficits are appreciated.    ED Results / Procedures / Treatments   Labs (all labs ordered are listed, but only abnormal results are displayed) Labs Reviewed  CBC WITH DIFFERENTIAL/PLATELET - Abnormal; Notable for the following components:      Result Value   RBC 3.00 (*)    Hemoglobin 7.4 (*)    HCT 24.9 (*)    MCH 24.7 (*)    MCHC 29.7 (*)    RDW 19.4 (*)    All other components within normal limits  COMPREHENSIVE METABOLIC PANEL - Abnormal; Notable for the following components:   Creatinine, Ser 1.08 (*)    Albumin 3.1 (*)    AST 14 (*)    GFR, Estimated 57 (*)    All other components within normal limits  TSH  URINALYSIS, ROUTINE W REFLEX MICROSCOPIC     EKG  ED ECG REPORT I, Blake Divine, the attending physician, personally viewed and interpreted this ECG.  Date: 03/09/2022  EKG Time: 16:32  Rate: 75  Rhythm: normal sinus rhythm, occasional PVC noted, unifocal  Axis: Normal  Intervals:first-degree A-V block   ST&T Change: None  RADIOLOGY Leg x-ray reviewed and interpreted by me with no fracture or dislocation, no findings concerning for osteomyelitis.  Chest x-ray reviewed and interpreted by me with cardiomegaly, no focal infiltrate, edema, or effusion noted.  PROCEDURES:  Critical Care performed: No  Procedures   MEDICATIONS ORDERED IN ED: Medications  vancomycin (VANCOCIN) IVPB 1000 mg/200 mL premix (1,000 mg Intravenous New Bag/Given 03/09/22 1813)  cefTRIAXone (ROCEPHIN) 1 g in sodium chloride 0.9 % 100 mL IVPB (0 g Intravenous Stopped 03/09/22 1812)     IMPRESSION / MDM / ASSESSMENT AND PLAN / ED COURSE  I reviewed the triage vital signs and the nursing notes.                               67 y.o. female with past medical history of hypertension, diabetes, CKD, anemia, MGUS, and lymphedema who presents to the ED with acute onset weakness starting earlier today, causing her to be unable to get up out of bed on her own.  Patient's presentation is most consistent with acute presentation with potential threat to life or bodily function.  Differential diagnosis includes, but is not limited to, cellulitis, sepsis, anemia, electrolyte abnormality, AKI, UTI, pneumonia.  Patient nontoxic-appearing and in no acute distress, vital signs are unremarkable.  She does have apparent wound with associated cellulitis to her left lateral lower leg, no evidence of abscess on exam and vitals do not appear concerning for sepsis.  EKG shows no evidence of arrhythmia or ischemia, chest x-ray shows vascular congestion but no evidence of pulmonary edema and patient denies any difficulty breathing.  Left leg x-ray shows no evidence of osteomyelitis, will treat with Rocephin and vancomycin.  Labs are pending at this time, will check TSH given apparent enlarged thyroid.  Labs are reassuring with no significant leukocytosis, electrolyte abnormality, or AKI.  Patient does have chronic anemia that is stable today, no evidence of bleeding.  Thyroid function is unremarkable.  Case discussed with hospitalist for admission for ongoing treatment of cellulitis and generalized weakness.      FINAL CLINICAL IMPRESSION(S) / ED DIAGNOSES   Final diagnoses:  Cellulitis of left lower extremity  Generalized weakness     Rx / DC Orders   ED Discharge Orders     None        Note:  This document was prepared using Dragon voice recognition software and may include unintentional dictation errors.   Blake Divine, MD 03/09/22 [id number]

## 2022-03-09 NOTE — ED Triage Notes (Signed)
See first nurse note. 

## 2022-03-09 NOTE — H&P (Signed)
History and Physical   [facility] - [place] @ [facility] Admission History and Physical [employer], D.O.    Patient Name: Veronica Krueger MR#: [id number] Date of Birth: Aug 29, 1955 Date of Admission: 03/09/2022  Referring MD/NP/PA: Dr. Charna Archer Primary Care Physician: Mechele Claude, FNP  Chief Complaint:  Chief Complaint  Patient presents with   Weakness    HPI: Veronica Krueger is a 67 y.o. female with a known history of DM, HTN, anemia, lymphedema, migraines, MGUS, CKD presents to the emergency department for evaluation of weakness.  Patient reports that she has had a LLE wound that started in 2022 on the left lateral leg.  She has taken multiple courses of antibiotics for it, has been seeing wound care nurse twice weekly and has had intermittent flare ups of oozing, pain and redness.    Started Levaquin on Friday 3 days ago for LLE cellulitis. she started having foul smelling purulent drainage from the wound as well as increased pain.  She also reports new bilateral lower leg pain.   She is usually functionally independent, ambulates with a wheelchair but was having difficulty getting out of bed this morning.   Patient denies fevers/chills, weakness, dizziness, chest pain, shortness of breath, N/V/C/D, abdominal pain, dysuria/frequency, changes in mental status.    Otherwise there has been no change in status. Patient has been taking medication as prescribed and there has been no recent change in medication or diet.  No recent antibiotics.  There has been no recent illness, hospitalizations, travel or sick contacts.    EMS/ED Course: Patient received Rocephin, Vanco. Medical admission has been requested for further management of LLE cellulitis.  Review of Systems:  CONSTITUTIONAL: Positive weakness.  No fever/chills, fatigue, weakness, weight gain/loss, headache. EYES: No blurry or double vision. ENT: No tinnitus, postnasal drip, redness or soreness of the  oropharynx. RESPIRATORY: No cough, dyspnea, wheeze.  No hemoptysis.  CARDIOVASCULAR: No chest pain, palpitations, syncope, orthopnea. No lower extremity edema.  GASTROINTESTINAL: No nausea, vomiting, abdominal pain, diarrhea, constipation.  No hematemesis, melena or hematochezia. GENITOURINARY: No dysuria, frequency, hematuria. ENDOCRINE: No polyuria or nocturia. No heat or cold intolerance. HEMATOLOGY: No anemia, bruising, bleeding. INTEGUMENTARY: Positive LLE wound. MUSCULOSKELETAL: Positive bilateral leg pain No arthritis, gout. NEUROLOGIC: No numbness, tingling, ataxia, seizure-type activity, weakness. PSYCHIATRIC: No anxiety, depression, insomnia.   Past Medical History:  Diagnosis Date   Anemia    Diabetes mellitus without complication ([place])    type 2    Hypertension    Left hand weakness    related to previous stomach abscess surgery    Lymph edema    legs    Migraine     Past Surgical History:  Procedure Laterality Date   bilateral cataract surgery      history of stomach surgery      due to left sided abscess of stomach - 2010    LAPAROSCOPIC GASTRIC SLEEVE RESECTION N/A 02/08/2018   Procedure: LAPAROSCOPIC GASTRIC SLEEVE RESECTION WITH HIATAL HERNIA REPAIR AND UPPER ENDO, ERAS Pathway;  Surgeon: Johnathan Hausen, MD;  Location: [facility];  Service: General;  Laterality: N/A;     reports that she has never smoked. She has never used smokeless tobacco. She reports that she does not drink alcohol and does not use drugs.  Allergies  Allergen Reactions   Penicillins Itching, Other (See Comments) and Swelling    Has patient had a PCN reaction causing immediate rash, facial/tongue/throat swelling, SOB or lightheadedness with hypotension:  Has patient had a  PCN reaction causing severe rash involving mucus membranes or skin necrosis:  Has patient had a PCN reaction that required hospitalization: Has patient had a PCN reaction occurring within the last 10 years: No If all of  the above answers are "NO", then may proceed with Cephalosporin use.  Other reaction(s): Other (See Comments), Other (See Comments) Has patient had a PCN reaction causing immediate rash, facial/tongue/throat swelling, SOB or lightheadedness with hypotension:  Has patient had a PCN reaction causing severe rash involving mucus membranes or skin necrosis:  Has patient had a PCN reaction that required hospitalization: Has patient had a PCN reaction occurring within the last 10 years: No If all of the above answers are "NO", then may proceed with Cephalosporin use. Has patient had a PCN reaction causing immediate rash, facial/tongue/throat swelling, SOB or lightheadedness with hypotension:  Has patient had a PCN reaction causing severe rash involving mucus membranes or skin necrosis:  Has patient had a PCN reaction that required hospitalization: Has patient had a PCN reaction occurring within the last 10 years: No If all of the above answers are "NO", then may proceed with Cephalosporin use.   Gabapentin Other (See Comments) and Itching    Hair loss  Hair loss Other reaction(s): Other (See Comments), Other (See Comments), Other (See Comments) Hair loss Hair loss Hair loss Hair loss Hair loss Hair loss Hair loss Hair loss Hair loss Hair loss     Family History  Problem Relation Age of Onset   Cancer Sister    Diabetes Sister     Prior to Admission medications   Medication Sig Start Date End Date Taking? Authorizing Provider  acetaminophen (TYLENOL) 500 MG tablet Take 1,000 mg by mouth every 6 (six) hours as needed for moderate pain or headache.     [provider]  allopurinol (ZYLOPRIM) 100 MG tablet Take 100 mg by mouth daily. 03/17/18   [provider]  b complex vitamins capsule Take 1 capsule by mouth daily.    [provider]  calcium carbonate (OS-CAL) 1250 (500 Ca) MG chewable tablet Chew 1 tablet by mouth 3 (three) times daily.    [provider]  celecoxib (CELEBREX) 200 MG capsule Take 200 mg by mouth 2 (two) times daily.    [provider]  dorzolamide (TRUSOPT) 2 % ophthalmic solution Place 1 drop into the right eye 2 (two) times daily.  07/20/17   [provider]  Erenumab-aooe (AIMOVIG, 140 MG DOSE,) 70 MG/ML SOAJ Inject 140 mg into the skin every 28 (twenty-eight) days. Patient not taking: Reported on 02/04/2022    [provider]  ferrous sulfate 325 (65 FE) MG tablet Take 325 mg by mouth daily. 12/11/20   [provider]  glucose blood test strip 1 each by Other route as needed for other. Use as instructed    [provider]  latanoprost (XALATAN) 0.005 % ophthalmic solution Place 1 drop into the right eye at bedtime.  06/21/17   [provider]  metoprolol tartrate (LOPRESSOR) 25 MG tablet Take 25 mg by mouth 2 (two) times daily.    [provider]  Multiple Vitamin (MULTIVITAMIN) tablet Take 1 tablet by mouth daily.    [provider]  rosuvastatin (CRESTOR) 10 MG tablet Take 10 mg by mouth every evening. Patient admits to noncompliance with this medication Patient not taking: Reported on 02/04/2022 01/01/21   [provider]  timolol (TIMOPTIC) 0.5 % ophthalmic solution Place 1 drop into both eyes  2 (two) times daily.  07/08/17   [provider]  topiramate (TOPAMAX) 25 MG tablet Take 25 mg by mouth 2 (two) times daily. Patient not taking: Reported on 02/04/2022 10/25/20   [provider]  vitamin B-12 1000 MCG tablet Take 1 tablet (1,000 mcg total) by mouth daily. Patient not taking: Reported on 02/04/2022 01/10/21   Oswald Hillock, MD  Vitamin D, Ergocalciferol, (DRISDOL) 1.25 MG (50000 UT) CAPS capsule Take 50,000 Units by mouth once a week. 03/17/18   [provider]    Physical Exam: Vitals:   03/09/22 1545 03/09/22 1614 03/09/22 1615 03/09/22 1630  BP: (!) 159/91 (!) 143/67 (!) 143/67   Pulse: 76 75 75  73  Resp: '17 20  18  '$ Temp: 98.5 F (36.9 C)     TempSrc: Oral     SpO2: 100% 99% 99% 97%    GENERAL: 67 y.o.-year-old obese black female patient, well-developed, well-nourished lying in the bed in no acute distress.  Pleasant and cooperative.   HEENT: Head atraumatic, normocephalic. Pupils equal. Mucus membranes moist. NECK: Supple. No JVD. CHEST: Normal breath sounds bilaterally. No wheezing, rales, rhonchi or crackles. No use of accessory muscles of respiration.  No reproducible chest wall tenderness.  CARDIOVASCULAR: S1, S2 normal. No murmurs, rubs, or gallops.  ABDOMEN: Soft, nondistended, nontender. No rebound, guarding, rigidity. Normoactive bowel sounds present in all four quadrants.  EXTREMITIES: Shallow open ulceration of the left lateral leg with granulation tissue at the edges, erythema, tenderness to palpation..  Moist drainage.  Severe lymphedema with skin thickening and discoloration bilateral lower extremities.  NEUROLOGIC: The patient is alert and oriented x 3. Cranial nerves II through XII are grossly intact with no focal sensorimotor deficit. PSYCHIATRIC:  Normal affect, mood, thought content. SKIN: Warm, dry, and intact without obvious rash, lesion, or ulcer.    Labs on Admission:  CBC: Recent Labs  Lab 03/09/22 1546  WBC 6.6  NEUTROABS 5.2  HGB 7.4*  HCT 24.9*  MCV 83.0  PLT 361   Basic Metabolic Panel: Recent Labs  Lab 03/09/22 1546  NA 143  K 4.0  CL 111  CO2 27  GLUCOSE 90  BUN 14  CREATININE 1.08*  CALCIUM 9.1   GFR: CrCl cannot be calculated (Unknown ideal weight.). Liver Function Tests: Recent Labs  Lab 03/09/22 1546  AST 14*  ALT 11  ALKPHOS 53  BILITOT 0.9  PROT 6.7  ALBUMIN 3.1*   No results for input(s): "LIPASE", "AMYLASE" in the last 168 hours. No results for input(s): "AMMONIA" in the last 168 hours. Coagulation Profile: No results for input(s): "INR", "PROTIME" in the last 168 hours. Cardiac Enzymes: No results for  input(s): "CKTOTAL", "CKMB", "CKMBINDEX", "TROPONINI" in the last 168 hours. BNP (last 3 results) No results for input(s): "PROBNP" in the last 8760 hours. HbA1C: No results for input(s): "HGBA1C" in the last 72 hours. CBG: No results for input(s): "GLUCAP" in the last 168 hours. Lipid Profile: No results for input(s): "CHOL", "HDL", "LDLCALC", "TRIG", "CHOLHDL", "LDLDIRECT" in the last 72 hours. Thyroid Function Tests: Recent Labs    03/09/22 1546  TSH 1.328   Anemia Panel: No results for input(s): "VITAMINB12", "FOLATE", "FERRITIN", "TIBC", "IRON", "RETICCTPCT" in the last 72 hours. Urine analysis:    Component Value Date/Time   COLORURINE YELLOW (A) 08/10/2018 1558   APPEARANCEUR CLEAR (A) 08/10/2018 1558   APPEARANCEUR Hazy 03/05/2013 1707   LABSPEC 1.009 08/10/2018 1558   LABSPEC 1.010 03/05/2013 1707   PHURINE 6.0  08/10/2018 [place] 08/10/2018 1558   GLUCOSEU 50 mg/dL 03/05/2013 1707   HGBUR NEGATIVE 08/10/2018 1558   BILIRUBINUR NEGATIVE 08/10/2018 1558   BILIRUBINUR Negative 03/05/2013 [place] 08/10/2018 1558   PROTEINUR NEGATIVE 08/10/2018 1558   NITRITE NEGATIVE 08/10/2018 1558   LEUKOCYTESUR NEGATIVE 08/10/2018 1558   LEUKOCYTESUR Negative 03/05/2013 1707   Sepsis Labs: '@LABRCNTIP'$ (procalcitonin:4,lacticidven:4) )No results found for this or any previous visit (from the past 240 hour(s)).   Radiological Exams on Admission: DG Tibia/Fibula Left  Result Date: 03/09/2022 CLINICAL DATA:  Lower extremity infection EXAM: LEFT TIBIA AND FIBULA - 2 VIEW COMPARISON:  None Available. FINDINGS: Frontal and lateral views of the left tibia and fibula are obtained. There is diffuse subcutaneous edema, most pronounced in the lateral calf and medial ankle. No subcutaneous gas or radiopaque foreign body. There are no acute or destructive bony lesions. IMPRESSION: 1. Diffuse subcutaneous edema. No radiopaque foreign body or subcutaneous gas. 2.  No acute or destructive bony abnormality. Electronically Signed   By: Randa Ngo M.D.   On: 03/09/2022 17:20   DG Chest Port 1 View  Result Date: 03/09/2022 CLINICAL DATA:  Weakness EXAM: PORTABLE CHEST 1 VIEW COMPARISON:  09/01/2017 FINDINGS: 2 frontal views of the chest demonstrate an enlarged cardiac silhouette. There is central vascular congestion without airspace disease, effusion, or pneumothorax. No acute bony abnormalities. IMPRESSION: 1. Enlarged cardiac silhouette. 2. Central vascular congestion without overt edema. Electronically Signed   By: Randa Ngo M.D.   On: 03/09/2022 17:18    EKG: Normal sinus rhythm at 75 bpm with normal axis, 1st degree AV block  and nonspecific ST-T wave changes.   Assessment/Plan  This is a 67 y.o. female with a history of DM, HTN, anemia, lymphedema, migraines, MGUS, CKD  now being admitted with:  #. LLE cellulitis, acute on chronic - Admit to inpatient with telemetry monitoring - IV antibiotics: Rocephin and Vanco - IV fluid hydration - Wound care consult - Follow up wound cultures - Repeat CBC in am.  - Consider MRI / bone scan to eval for osteo if not improving,   #. Bilateral lower extremity pain - Will check DDimer and if elevated obtain bilateral LE dopplers given immobility.    #. Generalized weakness likely 2/2 above - PT eval - Fall precautions  #. H/o Diabetes - Accuchecks achs with RISS coverage - Heart healthy, carb controlled diet  #. History of normocytic anemia, chronic and stable - Monitor CBC - Continue iron  #. History of HTN - Continue metoprolol  #. History of HLD - No longer medicated  #. History of lymphedema - Has compression boots, may use on R only at this time.   #. History of CKD - Continue migraines  #. History of MGUS - Monitor   Admission status: Inpatient IV Fluids: HL Diet/Nutrition: Heart healthy carb controlled Consults called: None  DVT Px: Lovenox, SCDs and early  ambulation. Code Status: Full Code  Disposition Plan: To home in 1-2 days  All the records are reviewed and case discussed with ED provider. Management plans discussed with the patient and/or family who express understanding and agree with plan of care.  Joshus Rogan D.O. on 03/09/2022 at 6:36 PM CC: Primary care physician; Mechele Claude, FNP   03/09/2022, 6:36 PM

## 2022-03-09 NOTE — Progress Notes (Signed)
Anticoagulation monitoring(Lovenox):  67 yo  female ordered Lovenox 40 mg Q24h    Filed Weights   03/09/22 2050  Weight: (!) 142.7 kg (314 lb 9.5 oz)   BMI 46.5   Lab Results  Component Value Date   CREATININE 1.08 (H) 03/09/2022   CREATININE 1.07 (H) 02/04/2022   CREATININE 2.00 (H) 01/09/2021   Estimated Creatinine Clearance: 80.4 mL/min (A) (by C-G formula based on SCr of 1.08 mg/dL (H)). Hemoglobin & Hematocrit     Component Value Date/Time   HGB 7.4 (L) 03/09/2022 1546   HGB 7.4 (L) 03/05/2013 1708   HCT 24.9 (L) 03/09/2022 1546   HCT 23.9 (L) 03/05/2013 1708     Per Protocol for Patient with estCrcl > 30 ml/min and BMI > 30, will transition to Lovenox 72.5 mg Q24h.

## 2022-03-10 ENCOUNTER — Inpatient Hospital Stay: Payer: Medicare HMO

## 2022-03-10 DIAGNOSIS — L03116 Cellulitis of left lower limb: Secondary | ICD-10-CM | POA: Diagnosis not present

## 2022-03-10 LAB — URINALYSIS, ROUTINE W REFLEX MICROSCOPIC
Bilirubin Urine: NEGATIVE
Glucose, UA: NEGATIVE mg/dL
Ketones, ur: NEGATIVE mg/dL
Leukocytes,Ua: NEGATIVE
Nitrite: NEGATIVE
Protein, ur: >=300 mg/dL — AB
Specific Gravity, Urine: 1.01 (ref 1.005–1.030)
Squamous Epithelial / HPF: NONE SEEN /HPF (ref 0–5)
pH: 7 (ref 5.0–8.0)

## 2022-03-10 LAB — CBC
HCT: 21.8 % — ABNORMAL LOW (ref 36.0–46.0)
Hemoglobin: 6.5 g/dL — ABNORMAL LOW (ref 12.0–15.0)
MCH: 24.6 pg — ABNORMAL LOW (ref 26.0–34.0)
MCHC: 29.8 g/dL — ABNORMAL LOW (ref 30.0–36.0)
MCV: 82.6 fL (ref 80.0–100.0)
Platelets: 143 10*3/uL — ABNORMAL LOW (ref 150–400)
RBC: 2.64 MIL/uL — ABNORMAL LOW (ref 3.87–5.11)
RDW: 19.2 % — ABNORMAL HIGH (ref 11.5–15.5)
WBC: 5.5 10*3/uL (ref 4.0–10.5)
nRBC: 0 % (ref 0.0–0.2)

## 2022-03-10 LAB — COMPREHENSIVE METABOLIC PANEL
ALT: 11 U/L (ref 0–44)
AST: 14 U/L — ABNORMAL LOW (ref 15–41)
Albumin: 2.7 g/dL — ABNORMAL LOW (ref 3.5–5.0)
Alkaline Phosphatase: 41 U/L (ref 38–126)
Anion gap: 5 (ref 5–15)
BUN: 12 mg/dL (ref 8–23)
CO2: 26 mmol/L (ref 22–32)
Calcium: 8.5 mg/dL — ABNORMAL LOW (ref 8.9–10.3)
Chloride: 110 mmol/L (ref 98–111)
Creatinine, Ser: 1.15 mg/dL — ABNORMAL HIGH (ref 0.44–1.00)
GFR, Estimated: 53 mL/min — ABNORMAL LOW (ref >60)
Glucose, Bld: 107 mg/dL — ABNORMAL HIGH (ref 70–99)
Potassium: 3.4 mmol/L — ABNORMAL LOW (ref 3.5–5.1)
Sodium: 141 mmol/L (ref 135–145)
Total Bilirubin: 0.8 mg/dL (ref 0.3–1.2)
Total Protein: 5.8 g/dL — ABNORMAL LOW (ref 6.5–8.1)

## 2022-03-10 LAB — C-REACTIVE PROTEIN: CRP: 4.1 mg/dL — ABNORMAL HIGH (ref <1.0)

## 2022-03-10 LAB — MAGNESIUM: Magnesium: 1.7 mg/dL (ref 1.7–2.4)

## 2022-03-10 LAB — HEMOGLOBIN A1C
Hgb A1c MFr Bld: 5.1 % (ref 4.8–5.6)
Mean Plasma Glucose: 99.67 mg/dL

## 2022-03-10 LAB — D-DIMER, QUANTITATIVE: D-Dimer, Quant: 0.73 ug/mL-FEU — ABNORMAL HIGH (ref 0.00–0.50)

## 2022-03-10 LAB — FERRITIN: Ferritin: 199 ng/mL (ref 11–307)

## 2022-03-10 LAB — IRON AND TIBC
Iron: 22 ug/dL — ABNORMAL LOW (ref 28–170)
Saturation Ratios: 12 % (ref 10.4–31.8)
TIBC: 179 ug/dL — ABNORMAL LOW (ref 250–450)
UIBC: 157 ug/dL

## 2022-03-10 LAB — FOLATE: Folate: >40 ng/mL (ref >5.9)

## 2022-03-10 LAB — GLUCOSE, CAPILLARY
Glucose-Capillary: 90 mg/dL (ref 70–99)
Glucose-Capillary: 101 mg/dL — ABNORMAL HIGH (ref 70–99)
Glucose-Capillary: 115 mg/dL — ABNORMAL HIGH (ref 70–99)
Glucose-Capillary: 116 mg/dL — ABNORMAL HIGH (ref 70–99)

## 2022-03-10 LAB — VITAMIN D 25 HYDROXY (VIT D DEFICIENCY, FRACTURES): Vit D, 25-Hydroxy: 74.06 ng/mL (ref 30–100)

## 2022-03-10 LAB — PHOSPHORUS: Phosphorus: 3.4 mg/dL (ref 2.5–4.6)

## 2022-03-10 LAB — PREPARE RBC (CROSSMATCH)

## 2022-03-10 LAB — VITAMIN B12: Vitamin B-12: 267 pg/mL (ref 180–914)

## 2022-03-10 LAB — CK: Total CK: 65 U/L (ref 38–234)

## 2022-03-10 LAB — HIV ANTIBODY (ROUTINE TESTING W REFLEX): HIV Screen 4th Generation wRfx: NONREACTIVE

## 2022-03-10 LAB — URIC ACID: Uric Acid, Serum: 4.4 mg/dL (ref 2.5–7.1)

## 2022-03-10 MED ORDER — VANCOMYCIN HCL 1750 MG/350ML IV SOLN
1750.0000 mg | INTRAVENOUS | Status: DC
  Filled 2022-03-10: qty 350

## 2022-03-10 MED ORDER — VANCOMYCIN HCL 1500 MG/300ML IV SOLN
1500.0000 mg | Freq: Once | INTRAVENOUS | Status: DC
  Filled 2022-03-10: qty 300

## 2022-03-10 MED ORDER — VANCOMYCIN HCL IN DEXTROSE 1-5 GM/200ML-% IV SOLN
1000.0000 mg | Freq: Once | INTRAVENOUS | Status: DC
  Filled 2022-03-10: qty 200

## 2022-03-10 MED ORDER — SODIUM CHLORIDE 0.9% IV SOLUTION: Freq: Once | INTRAVENOUS | Status: AC

## 2022-03-10 MED ORDER — PANTOPRAZOLE SODIUM 40 MG PO TBEC
40.0000 mg | DELAYED_RELEASE_TABLET | Freq: Two times a day (BID) | ORAL | Status: DC
  Administered 2022-03-10 – 2022-03-13 (×7): 40 mg via ORAL
  Filled 2022-03-10 (×7): qty 1

## 2022-03-10 MED ORDER — CALCIUM CARBONATE ANTACID 500 MG PO CHEW
1.0000 | CHEWABLE_TABLET | Freq: Three times a day (TID) | ORAL | Status: DC
  Administered 2022-03-10 – 2022-03-13 (×9): 200 mg via ORAL
  Filled 2022-03-10 (×8): qty 1

## 2022-03-10 MED ORDER — POLYETHYLENE GLYCOL 3350 17 G PO PACK
17.0000 g | PACK | Freq: Every day | ORAL | Status: DC
  Filled 2022-03-10 (×3): qty 1

## 2022-03-10 MED ORDER — BISACODYL 5 MG PO TBEC: 10.0000 mg | DELAYED_RELEASE_TABLET | Freq: Every day | ORAL | Status: DC | PRN

## 2022-03-10 MED ORDER — VANCOMYCIN HCL 2000 MG/400ML IV SOLN
2000.0000 mg | Freq: Once | INTRAVENOUS | Status: AC
  Administered 2022-03-10: 2000 mg via INTRAVENOUS
  Filled 2022-03-10: qty 400

## 2022-03-10 MED ORDER — SODIUM CHLORIDE 0.9 % IV SOLN
2.0000 g | INTRAVENOUS | Status: DC
  Administered 2022-03-10 – 2022-03-12 (×4): 2 g via INTRAVENOUS
  Filled 2022-03-10 (×2): qty 2
  Filled 2022-03-10 (×2): qty 20

## 2022-03-10 MED FILL — Iron Sucrose Inj 20 MG/ML (Fe Equiv): INTRAVENOUS | Qty: 10 | Status: AC

## 2022-03-10 NOTE — Progress Notes (Signed)
[facility] Progress Note  Patient: Veronica Krueger    ACZ:[id number]  DOA: 03/09/2022     Date of Service: the patient was seen and examined on 03/10/2022  Chief Complaint  Patient presents with   Weakness   Brief hospital course:  HPI: Rasheka Denard is a 67 y.o. female with a known history of DM, HTN, anemia, lymphedema, migraines, MGUS, CKD presents to the emergency department for evaluation of weakness.  Patient reports that she has had a LLE wound that started in 2022 on the left lateral leg.  She has taken multiple courses of antibiotics for it, has been seeing wound care nurse twice weekly and has had intermittent flare ups of oozing, pain and redness.   Started Levaquin on Friday 3 days ago for LLE cellulitis. she started having foul smelling purulent drainage from the wound as well as increased pain.  She also reports new bilateral lower leg pain.  She is usually functionally independent, ambulates with a wheelchair but was having difficulty getting out of bed this morning.  MS/ED Course: Patient received Rocephin, Vanco. Medical admission has been requested for further management of LLE cellulitis.     Assessment and Plan:  # LLE cellulitis, acute on chronic Continue IV antibiotics: Rocephin and Vanco S/p IV fluid for hydration,  - Wound care consult - Follow up wound cultures - Repeat CBC in am.  - Consider MRI / bone scan to eval for osteo if not improving,    # Bilateral lower extremity pain D-dimer 0.73 slightly elevated, venous duplex negative for DVT   # Generalized weakness likely 2/2 above - PT eval - Fall precautions   # H/o Diabetes, HbA1c 5.1, within normal range.  Well-controlled - Accuchecks achs with RISS coverage - Heart healthy, carb controlled diet   # History of normocytic anemia, chronic and stable 1/16 Hb 6.5, dropped, transfuse 1 unit of PRBC Iron level 22, transferrin saturation 12%, slightly low.  Folate >40 above normal range.    Check FOBT to rule out GI bleeding - Monitor CBC - Continue iron    # History of HTN - Continue metoprolol   # History of HLD - No longer medicated   # History of lymphedema - Has compression boots, may use on R only at this time.    # History of CKD - Continue vitamins and minerals   # History of MGUS - Monitor  Morbid obesity Body mass index is 46.49 kg/m.  Interventions:     Diet: Heart healthy/carb modified DVT Prophylaxis: Subcutaneous Lovenox   Advance goals of care discussion: Full code  Family Communication: family was NOT present at bedside, at the time of interview.  The pt provided permission to discuss medical plan with the family. Opportunity was given to ask question and all questions were answered satisfactorily.   Disposition:  Pt is from Home, admitted with left lower extremity cellulitis, chronic nonhealing wound, anemia, hemoglobin was low, PRBC transfusion ordered, still has chronic left lower extremity wound on IV antibiotics, which precludes a safe discharge. Discharge TBD, PT eval pending, when clinically stable..  Subjective: No significant events overnight, patient was resting comfortably denied any acute symptoms, agreed with the blood transfusion.  Patient had blood transfusion a year ago.  Patient was complaining of nonhealing of chronic wound of the left lower extremity, dressing was done by wound care so I could not examine by myself.  We will have a look tomorrow a.m. and continue current treatment.   Physical Exam:  General: NAD, lying comfortably Appear in no distress, affect appropriate Eyes: PERRLA ENT: Oral Mucosa Clear, moist  Neck: no JVD,  Cardiovascular: S1 and S2 Present, no Murmur,  Respiratory: good respiratory effort, Bilateral Air entry equal and Decreased, no Crackles, no wheezes Abdomen: Bowel Sound present, Soft and no tenderness,  Skin: no rashes Extremities: Chronic lymphedema of bilateral lower extremities, LLE  wrapped by wound care but not see wound today. Neurologic: without any new focal findings Gait not checked due to patient safety concerns  Vitals:   03/09/22 2050 03/09/22 2111 03/10/22 0452 03/10/22 0820  BP:  (!) 142/72 (!) 126/41 (!) 137/46  Pulse:  74 65 67  Resp:  '18 18 18  '$ Temp:  97.8 F (36.6 C) 98.7 F (37.1 C) 98.6 F (37 C)  TempSrc:  Oral Oral   SpO2:  98% 93% 90%  Weight: (!) 142.7 kg (!) 142.8 kg    Height: '5\' 9"'$  (1.753 m) '5\' 9"'$  (1.753 m)      Intake/Output Summary (Last 24 hours) at 03/10/2022 1329 Last data filed at 03/10/2022 1044 Gross per 24 hour  Intake 463 ml  Output 400 ml  Net 63 ml   Filed Weights   03/09/22 2050 03/09/22 2111  Weight: (!) 142.7 kg (!) 142.8 kg    Data Reviewed: I have personally reviewed and interpreted daily labs, tele strips, imagings as discussed above. I reviewed all nursing notes, pharmacy notes, vitals, pertinent old records I have discussed plan of care as described above with RN and patient/family.  CBC: Recent Labs  Lab 03/09/22 1546 03/10/22 0103  WBC 6.6 5.5  NEUTROABS 5.2  --   HGB 7.4* 6.5*  HCT 24.9* 21.8*  MCV 83.0 82.6  PLT 180 762*   Basic Metabolic Panel: Recent Labs  Lab 03/09/22 1546 03/10/22 0103 03/10/22 0810  NA 143 141  --   K 4.0 3.4*  --   CL 111 110  --   CO2 27 26  --   GLUCOSE 90 107*  --   BUN 14 12  --   CREATININE 1.08* 1.15*  --   CALCIUM 9.1 8.5*  --   MG  --   --  1.7  PHOS  --   --  3.4    Studies: US Venous Img Lower Bilateral (DVT)  Result Date: 03/10/2022 CLINICAL DATA:  Bilateral lower extremity edema.  Evaluate for DVT. EXAM: BILATERAL LOWER EXTREMITY VENOUS DOPPLER ULTRASOUND TECHNIQUE: Gray-scale sonography with graded compression, as well as color Doppler and duplex ultrasound were performed to evaluate the lower extremity deep venous systems from the level of the common femoral vein and including the common femoral, femoral, profunda femoral, popliteal and calf  veins including the posterior tibial, peroneal and gastrocnemius veins when visible. The superficial great saphenous vein was also interrogated. Spectral Doppler was utilized to evaluate flow at rest and with distal augmentation maneuvers in the common femoral, femoral and popliteal veins. COMPARISON:  None Available. FINDINGS: RIGHT LOWER EXTREMITY Common Femoral Vein: No evidence of thrombus. Normal compressibility, respiratory phasicity and response to augmentation. Saphenofemoral Junction: No evidence of thrombus. Normal compressibility and flow on color Doppler imaging. Profunda Femoral Vein: No evidence of thrombus. Normal compressibility and flow on color Doppler imaging. Femoral Vein: No evidence of thrombus. Normal compressibility, respiratory phasicity and response to augmentation. Popliteal Vein: No evidence of thrombus. Normal compressibility, respiratory phasicity and response to augmentation. Calf Veins: No evidence of thrombus. Normal compressibility and flow on color Doppler imaging.  Superficial Great Saphenous Vein: No evidence of thrombus. Normal compressibility. Other Findings:  None. LEFT LOWER EXTREMITY Common Femoral Vein: No evidence of thrombus. Normal compressibility, respiratory phasicity and response to augmentation. Saphenofemoral Junction: No evidence of thrombus. Normal compressibility and flow on color Doppler imaging. Profunda Femoral Vein: No evidence of thrombus. Normal compressibility and flow on color Doppler imaging. Femoral Vein: No evidence of thrombus. Normal compressibility, respiratory phasicity and response to augmentation. Popliteal Vein: No evidence of thrombus. Normal compressibility, respiratory phasicity and response to augmentation. Calf Veins: No evidence of thrombus. Normal compressibility and flow on color Doppler imaging. Superficial Great Saphenous Vein: No evidence of thrombus. Normal compressibility. Other Findings:  None. IMPRESSION: No evidence of DVT within  either lower extremity. Electronically Signed   By: Sandi Mariscal M.D.   On: 03/10/2022 12:10   DG Tibia/Fibula Left  Result Date: 03/09/2022 CLINICAL DATA:  Lower extremity infection EXAM: LEFT TIBIA AND FIBULA - 2 VIEW COMPARISON:  None Available. FINDINGS: Frontal and lateral views of the left tibia and fibula are obtained. There is diffuse subcutaneous edema, most pronounced in the lateral calf and medial ankle. No subcutaneous gas or radiopaque foreign body. There are no acute or destructive bony lesions. IMPRESSION: 1. Diffuse subcutaneous edema. No radiopaque foreign body or subcutaneous gas. 2. No acute or destructive bony abnormality. Electronically Signed   By: Randa Ngo M.D.   On: 03/09/2022 17:20   DG Chest Port 1 View  Result Date: 03/09/2022 CLINICAL DATA:  Weakness EXAM: PORTABLE CHEST 1 VIEW COMPARISON:  09/01/2017 FINDINGS: 2 frontal views of the chest demonstrate an enlarged cardiac silhouette. There is central vascular congestion without airspace disease, effusion, or pneumothorax. No acute bony abnormalities. IMPRESSION: 1. Enlarged cardiac silhouette. 2. Central vascular congestion without overt edema. Electronically Signed   By: Randa Ngo M.D.   On: 03/09/2022 17:18    Scheduled Meds:  allopurinol  100 mg Oral Daily   calcium carbonate  1 tablet Oral TID   dorzolamide  1 drop Right Eye BID   enoxaparin (LOVENOX) injection  0.5 mg/kg Subcutaneous Q24H   ferrous sulfate  325 mg Oral Daily   insulin aspart  0-15 Units Subcutaneous TID WC   insulin aspart  0-5 Units Subcutaneous QHS   latanoprost  1 drop Right Eye QHS   metoprolol tartrate  25 mg Oral BID   multivitamin  1 tablet Oral Daily   multivitamin with minerals  1 tablet Oral Daily   pantoprazole  40 mg Oral BID   polyethylene glycol  17 g Oral Daily   sodium chloride flush  3 mL Intravenous Q12H   timolol  1 drop Both Eyes BID   Continuous Infusions:  cefTRIAXone (ROCEPHIN)  IV     vancomycin     PRN  Meds: acetaminophen **OR** acetaminophen, bisacodyl, hydrALAZINE, HYDROcodone-acetaminophen, morphine injection, ondansetron **OR** ondansetron (ZOFRAN) IV, traZODone  Time spent: 55 minutes  Author: Val Riles. MD [facility] 03/10/2022 1:29 PM  To reach On-call, see care teams to locate the attending and reach out to them via [email or web address]. If 7PM-7AM, please contact night-coverage If you still have difficulty reaching the attending provider, please page the [facility] (Director on Call) for [facility] on amion for assistance.

## 2022-03-10 NOTE — Progress Notes (Signed)
PT Cancellation Note  Patient Details Name: Veronica Krueger MRN: [id number] DOB: 1955/11/02   Cancelled Treatment:    Reason Eval/Treat Not Completed: Medical issues which prohibited therapy Orders recevied, chart reviewed. Patient with Hgb 6.5 this AM. Will hold until patient receives blood transfusion or cleared by MD to participate in therapies. PT will re-attempt at later time/date as appropriate.   Cherisse Carrell A. Gilford Rile PT, DPT [facility] - Acute Rehabilitation Services   [name] 03/10/2022, 8:12 AM

## 2022-03-10 NOTE — Consult Note (Addendum)
[place] Nurse Consult Note: Reason for Consult: Consult requested for left outer leg.  Wound type: Chronic full thickness wound; Pt states the wound began to have increased odor and drainage this week. She has been followed in the past by the outpatient wound care center. Recently she has been treated by home health with Iodosorb dressings and Profore wraps twice a week to reduce edema.  Discussed with her that we do not carry that in the [place] and I will substitute Aquacel, a similar product.  Also, in order to have dressings changed daily, instead of twice a week, I will not order Profore but will use Ace wrap to provide light compression.  She is in agreement with this plan of care and can resume the previous dressing change routine after discharge. She is on antibiotics for systemic coverage of the cellulitis.  Left outer calf full thickness wound with large amt yellow-green drainage, 50% red, 50% yellow, 6X9X.2cm.  Generalized lymphedema to bilat legs.  Pt states she has been approved to use lymphedema pumps at home just prior to this admission and has not started that process yet.  Dressing procedure/placement/frequency: Topical treatment orders provided for bedside nurses to perform as follows to provide antimicrobial benefits and absorb drainage; Apply Aquacel Kellie Simmering # [phone]) to left leg wound Q day, then cover with ABD pads and kerlex, then ace wrap.  Moisten with NS to remove previous Aquacel. Please re-consult if further assistance is needed.  Thank-you,  Julien Girt MSN, [place], [place], [place], [place]

## 2022-03-10 NOTE — Progress Notes (Signed)
Pharmacy Antibiotic Note  Veronica Krueger is a 67 y.o. female admitted on 03/09/2022 with cellulitis of LLE.  She has a PMH of DM, HTN, lymphedema, and CKD and has taken multiple courses of antibiotics prior to admission. Pharmacy has been consulted for Vancomycin dosing. Patient is also receiving ceftriaxone 2g every 24 hours. Patient received vancomycin 1g IV last night @ 1813.  Plan: Will order Vancomycin '2000mg'$  IV x 1 loading dose followed by Vancomycin 1750 mg IV Q 24 hrs.  Goal AUC 400-550. Expected AUC: 518 SCr used: 1.15   Will monitor Scr daily for now and adjust dose as needed.   Height: '5\' 9"'$  (175.3 cm) Weight: (!) 142.8 kg (314 lb 13.1 oz) IBW/kg (Calculated) : 66.2  Temp (24hrs), Avg:98.3 F (36.8 C), Min:97.8 F (36.6 C), Max:98.7 F (37.1 C)  Recent Labs  Lab 03/09/22 1546 03/10/22 0103  WBC 6.6 5.5  CREATININE 1.08* 1.15*    Estimated Creatinine Clearance: 75.5 mL/min (A) (by C-G formula based on SCr of 1.15 mg/dL (H)).    Allergies  Allergen Reactions   Penicillins Itching, Other (See Comments) and Swelling    Has patient had a PCN reaction causing immediate rash, facial/tongue/throat swelling, SOB or lightheadedness with hypotension:  Has patient had a PCN reaction causing severe rash involving mucus membranes or skin necrosis:  Has patient had a PCN reaction that required hospitalization: Has patient had a PCN reaction occurring within the last 10 years: No If all of the above answers are "NO", then may proceed with Cephalosporin use.  Other reaction(s): Other (See Comments), Other (See Comments) Has patient had a PCN reaction causing immediate rash, facial/tongue/throat swelling, SOB or lightheadedness with hypotension:  Has patient had a PCN reaction causing severe rash involving mucus membranes or skin necrosis:  Has patient had a PCN reaction that required hospitalization: Has patient had a PCN reaction occurring within the last 10 years: No If all  of the above answers are "NO", then may proceed with Cephalosporin use. Has patient had a PCN reaction causing immediate rash, facial/tongue/throat swelling, SOB or lightheadedness with hypotension:  Has patient had a PCN reaction causing severe rash involving mucus membranes or skin necrosis:  Has patient had a PCN reaction that required hospitalization: Has patient had a PCN reaction occurring within the last 10 years: No If all of the above answers are "NO", then may proceed with Cephalosporin use.   Gabapentin Other (See Comments) and Itching    Hair loss  Hair loss Other reaction(s): Other (See Comments), Other (See Comments), Other (See Comments) Hair loss Hair loss Hair loss Hair loss Hair loss Hair loss Hair loss Hair loss Hair loss Hair loss     Antimicrobials this admission: 1/15 Vancomycin  >>  1/15 Ceftriaxone >>   Dose adjustments this admission:   Microbiology results: N/A  Thank you for allowing pharmacy to be a part of this patient's care.  Pernell Dupre, PharmD, BCPS Clinical Pharmacist 03/10/2022 8:06 AM

## 2022-03-11 ENCOUNTER — Inpatient Hospital Stay: Payer: Medicare HMO

## 2022-03-11 ENCOUNTER — Other Ambulatory Visit: Payer: Medicare HMO

## 2022-03-11 ENCOUNTER — Ambulatory Visit: Payer: Medicare HMO | Admitting: Oncology

## 2022-03-11 ENCOUNTER — Ambulatory Visit: Payer: Medicare HMO

## 2022-03-11 DIAGNOSIS — L03116 Cellulitis of left lower limb: Secondary | ICD-10-CM | POA: Diagnosis not present

## 2022-03-11 LAB — GLUCOSE, CAPILLARY
Glucose-Capillary: 85 mg/dL (ref 70–99)
Glucose-Capillary: 119 mg/dL — ABNORMAL HIGH (ref 70–99)
Glucose-Capillary: 116 mg/dL — ABNORMAL HIGH (ref 70–99)
Glucose-Capillary: 105 mg/dL — ABNORMAL HIGH (ref 70–99)

## 2022-03-11 LAB — CBC
HCT: 23.4 % — ABNORMAL LOW (ref 36.0–46.0)
Hemoglobin: 7.1 g/dL — ABNORMAL LOW (ref 12.0–15.0)
MCH: 24.7 pg — ABNORMAL LOW (ref 26.0–34.0)
MCHC: 30.3 g/dL (ref 30.0–36.0)
MCV: 81.5 fL (ref 80.0–100.0)
Platelets: 122 10*3/uL — ABNORMAL LOW (ref 150–400)
RBC: 2.87 MIL/uL — ABNORMAL LOW (ref 3.87–5.11)
RDW: 18.5 % — ABNORMAL HIGH (ref 11.5–15.5)
WBC: 3.9 10*3/uL — ABNORMAL LOW (ref 4.0–10.5)
nRBC: 0 % (ref 0.0–0.2)

## 2022-03-11 LAB — BASIC METABOLIC PANEL
Anion gap: 7 (ref 5–15)
BUN: 18 mg/dL (ref 8–23)
CO2: 23 mmol/L (ref 22–32)
Calcium: 8.7 mg/dL — ABNORMAL LOW (ref 8.9–10.3)
Chloride: 110 mmol/L (ref 98–111)
Creatinine, Ser: 1.43 mg/dL — ABNORMAL HIGH (ref 0.44–1.00)
GFR, Estimated: 41 mL/min — ABNORMAL LOW (ref >60)
Glucose, Bld: 77 mg/dL (ref 70–99)
Potassium: 3.7 mmol/L (ref 3.5–5.1)
Sodium: 140 mmol/L (ref 135–145)

## 2022-03-11 LAB — PHOSPHORUS: Phosphorus: 3.5 mg/dL (ref 2.5–4.6)

## 2022-03-11 LAB — PREPARE RBC (CROSSMATCH)

## 2022-03-11 LAB — MAGNESIUM: Magnesium: 1.8 mg/dL (ref 1.7–2.4)

## 2022-03-11 LAB — MRSA NEXT GEN BY PCR, NASAL: MRSA by PCR Next Gen: NOT DETECTED

## 2022-03-11 MED ORDER — VITAMIN C 500 MG PO TABS
500.0000 mg | ORAL_TABLET | Freq: Two times a day (BID) | ORAL | Status: DC
  Administered 2022-03-11 – 2022-03-13 (×5): 500 mg via ORAL
  Filled 2022-03-11 (×5): qty 1

## 2022-03-11 MED ORDER — SODIUM CHLORIDE 0.9% IV SOLUTION: Freq: Once | INTRAVENOUS | Status: AC

## 2022-03-11 MED ORDER — POLYSACCHARIDE IRON COMPLEX 150 MG PO CAPS: 150.0000 mg | ORAL_CAPSULE | Freq: Every day | ORAL | Status: DC

## 2022-03-11 MED ORDER — VITAMIN B-12 1000 MCG PO TABS: 1000.0000 ug | ORAL_TABLET | Freq: Every day | ORAL | Status: DC

## 2022-03-11 MED ORDER — VANCOMYCIN HCL 1250 MG/250ML IV SOLN
1250.0000 mg | INTRAVENOUS | Status: DC
  Administered 2022-03-11 – 2022-03-12 (×2): 1250 mg via INTRAVENOUS
  Filled 2022-03-11 (×3): qty 250

## 2022-03-11 MED ORDER — CYANOCOBALAMIN 1000 MCG/ML IJ SOLN
1000.0000 ug | Freq: Every day | INTRAMUSCULAR | Status: DC
  Administered 2022-03-11 – 2022-03-13 (×3): 1000 ug via INTRAMUSCULAR
  Filled 2022-03-11 (×3): qty 1

## 2022-03-11 MED ORDER — VANCOMYCIN HCL 1500 MG/300ML IV SOLN
1500.0000 mg | INTRAVENOUS | Status: DC
  Filled 2022-03-11: qty 300

## 2022-03-11 NOTE — Evaluation (Signed)
Physical Therapy Evaluation Patient Details Name: Veronica Krueger MRN: [id number] DOB: 1955-07-12 Today's Date: 03/11/2022  History of Present Illness  67 y/o female presented to ED on 03/09/22 for L thigh wound and weakness. Admitted for acute on chronic LLE cellulitis. PMH: T2DM, HTN, lymphedema  Clinical Impression  Patient admitted with the above. PTA, patient lives alone and reports independence with stand pivot transfers to/from w/c and has friend who drives her to/from appointments. Patient presents with weakness, impaired balance, decreased activity tolerance, and decreased insight into deficits. Required minA for supine>sit with HOB elevated but required maxA+2 to return to supine for LE management due to size. Able to stand from very elevated bed surface with minA for linen change due to being found saturated in urine and malfunctioning Purewick. Patient will benefit from skilled PT services during acute stay to address listed deficits. Recommend SNF at discharge to maximize functional mobility as it is unsafe for patient to return home alone at current functional level and wound care needs.        Recommendations for follow up therapy are one component of a multi-disciplinary discharge planning process, led by the attending physician.  Recommendations may be updated based on patient status, additional functional criteria and insurance authorization.  Follow Up Recommendations Skilled nursing-short term rehab (<3 hours/day) Can patient physically be transported by private vehicle: No    Assistance Recommended at Discharge Frequent or constant Supervision/Assistance  Patient can return home with the following  A lot of help with walking and/or transfers;A lot of help with bathing/dressing/bathroom;Assistance with cooking/housework;Assist for transportation;Help with stairs or ramp for entrance    Equipment Recommendations None recommended by PT  Recommendations for Other Services  OT  consult    Functional Status Assessment Patient has had a recent decline in their functional status and demonstrates the ability to make significant improvements in function in a reasonable and predictable amount of time.     Precautions / Restrictions Precautions Precautions: Fall Restrictions Weight Bearing Restrictions: No      Mobility  Bed Mobility Overal bed mobility: Needs Assistance Bed Mobility: Supine to Sit, Sit to Supine     Supine to sit: Min assist Sit to supine: Max assist, +2 for safety/equipment, +2 for physical assistance   General bed mobility comments: minA for LE management off bed. Max+2 to return to supine with main focus of assist was for LE management due to size    Transfers Overall transfer level: Needs assistance Equipment used: Rolling Azaliyah Kennard (2 wheels) Transfers: Sit to/from Stand Sit to Stand: Min assist           General transfer comment: assist to stand at EOB with bed very elevated. Patient declines to take steps    Ambulation/Gait                  Stairs            Wheelchair Mobility    Modified Rankin (Stroke Patients Only)       Balance Overall balance assessment: Needs assistance Sitting-balance support: No upper extremity supported, Feet supported Sitting balance-Leahy Scale: Fair     Standing balance support: Bilateral upper extremity supported Standing balance-Leahy Scale: Fair                               Pertinent Vitals/Pain Pain Assessment Pain Assessment: Faces Faces Pain Scale: Hurts even more Pain Location: "all over" Pain Descriptors / Indicators: Grimacing,  Guarding Pain Intervention(s): Monitored during session, Repositioned    Home Living Family/patient expects to be discharged to:: Private residence Living Arrangements: Alone Available Help at Discharge: Family Type of Home: House Home Access: [place]: [place]: Chartered certified accountant (2 wheels);Wheelchair - manual      Prior Function Prior Level of Function : Independent/Modified Independent             Mobility Comments: reports independence with stand pivot transfer to/from w/c with no AD. 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    Lower Extremity Assessment Lower Extremity Assessment: Generalized weakness (hx of bilateral lymphedema)    Cervical / Trunk Assessment Cervical / Trunk Assessment: Kyphotic (large body habitus)  Communication   Communication: No difficulties  Cognition Arousal/Alertness: Awake/alert Behavior During Therapy: WFL for tasks assessed/performed Overall Cognitive Status: Within Functional Limits for tasks assessed                                 General Comments: poor awareness into current situation        General Comments General comments (skin integrity, edema, etc.): bed and patient saturated in urine, RN present to assist with linen change during standing    Exercises     Assessment/Plan    PT Assessment Patient needs continued PT services  PT Problem List Decreased strength;Decreased activity tolerance;Decreased balance;Decreased mobility;Decreased safety awareness;Decreased knowledge of use of DME;Decreased knowledge of precautions       PT Treatment Interventions DME instruction;Functional mobility training;Therapeutic activities;Therapeutic exercise;Balance training;Patient/family education    PT Goals (Current goals can be found in the Care Plan section)  Acute Rehab PT Goals Patient Stated Goal: did not state PT Goal Formulation: With patient Time For Goal Achievement: 03/25/22 Potential to Achieve Goals: Fair    Frequency Min 2X/week     Co-evaluation                AM-PAC PT "6 Clicks" Mobility  Outcome Measure Help needed turning from your back to your side while in a flat bed without using bedrails?: A Little Help needed moving from lying on your back to sitting on the side of a flat bed without using bedrails?: Total Help needed moving to and from a bed to a chair (including a wheelchair)?: Total Help needed standing up from a chair using your arms (e.g., wheelchair or bedside chair)?: A Little Help needed to walk in hospital room?: Total Help needed climbing 3-5 steps with a railing? : Total 6 Click Score: 10    End of Session   Activity Tolerance: Patient tolerated treatment well Patient left: in bed;with call bell/phone within reach;with bed alarm set Nurse Communication: Mobility status PT Visit Diagnosis: Unsteadiness on feet (R26.81);Muscle weakness (generalized) (M62.81);Other abnormalities of gait and mobility (R26.89)    Time: 1122-1203 PT Time Calculation (min) (ACUTE ONLY): 41 min   Charges:   PT Evaluation $PT Eval Moderate Complexity: 1 Mod PT Treatments $Therapeutic Activity: 23-37 mins        Jarret Torre A. Gilford Rile PT, DPT [facility] - Acute Rehabilitation Services   Darnell Jeschke A Yaminah Clayborn 03/11/2022, 3:06 PM

## 2022-03-11 NOTE — Progress Notes (Signed)
Pharmacy Antibiotic Note  Veronica Krueger is a 67 y.o. female admitted on 03/09/2022 with cellulitis of LLE.  She has a PMH of DM, HTN, lymphedema, and CKD and has taken multiple courses of antibiotics prior to admission. Pharmacy has been consulted for Vancomycin dosing. Patient is also receiving ceftriaxone 2g every 24 hours.   Scr increased from 1.15>> 1.43    Plan: Will adjust vancomycin regimen to Vancomycin 1250 mg IV Q 24 hrs.  Goal AUC 400-550. Expected AUC: 450 SCr used: 1.43  Will monitor Scr daily for now and adjust dose as needed.   Height: '5\' 9"'$  (175.3 cm) Weight: (!) 142.8 kg (314 lb 13.1 oz) IBW/kg (Calculated) : 66.2  Temp (24hrs), Avg:98.7 F (37.1 C), Min:98.1 F (36.7 C), Max:99.2 F (37.3 C)  Recent Labs  Lab 03/09/22 1546 03/10/22 0103 03/11/22 0607  WBC 6.6 5.5 3.9*  CREATININE 1.08* 1.15* 1.43*     Estimated Creatinine Clearance: 60.7 mL/min (A) (by C-G formula based on SCr of 1.43 mg/dL (H)).    Allergies  Allergen Reactions   Penicillins Itching, Other (See Comments) and Swelling    Has patient had a PCN reaction causing immediate rash, facial/tongue/throat swelling, SOB or lightheadedness with hypotension:  Has patient had a PCN reaction causing severe rash involving mucus membranes or skin necrosis:  Has patient had a PCN reaction that required hospitalization: Has patient had a PCN reaction occurring within the last 10 years: No If all of the above answers are "NO", then may proceed with Cephalosporin use.  Other reaction(s): Other (See Comments), Other (See Comments) Has patient had a PCN reaction causing immediate rash, facial/tongue/throat swelling, SOB or lightheadedness with hypotension:  Has patient had a PCN reaction causing severe rash involving mucus membranes or skin necrosis:  Has patient had a PCN reaction that required hospitalization: Has patient had a PCN reaction occurring within the last 10 years: No If all of the above  answers are "NO", then may proceed with Cephalosporin use. Has patient had a PCN reaction causing immediate rash, facial/tongue/throat swelling, SOB or lightheadedness with hypotension:  Has patient had a PCN reaction causing severe rash involving mucus membranes or skin necrosis:  Has patient had a PCN reaction that required hospitalization: Has patient had a PCN reaction occurring within the last 10 years: No If all of the above answers are "NO", then may proceed with Cephalosporin use.   Gabapentin Other (See Comments) and Itching    Hair loss  Hair loss Other reaction(s): Other (See Comments), Other (See Comments), Other (See Comments) Hair loss Hair loss Hair loss Hair loss Hair loss Hair loss Hair loss Hair loss Hair loss Hair loss     Antimicrobials this admission: 1/15 Vancomycin  >>  1/15 Ceftriaxone >>   Dose adjustments this admission: 1/17 vancomycin 1750 Q24H changed to Vancomycin 1250 Q24H  Microbiology results: N/A  Thank you for allowing pharmacy to be a part of this patient's care.  Pernell Dupre, PharmD, BCPS Clinical Pharmacist 03/11/2022 7:19 AM

## 2022-03-11 NOTE — Progress Notes (Signed)
Patient reports scheduled iron infusion tomorrow, 1/17, desire to speak to MD.  Dr Fabio Neighbors notified.  Following discussion, patient refused to sign consent and refuses blood infusion.  Patient educated importance of low HGB requiring blood transfusion.

## 2022-03-11 NOTE — Progress Notes (Signed)
OT Cancellation Note  Patient Details Name: Veronica Krueger MRN: [id number] DOB: [birth year]   Cancelled Treatment:    Reason Eval/Treat Not Completed: Other (comment). OT orders received, chart reviewed. Pt eating dinner upon arrival and endorsed fatigue from working with PT earlier. Pt wanting to finish dinner and requested OT come back tomorrow. Will re-attempt as able.  Doneta Public 03/11/2022, 4:59 PM

## 2022-03-11 NOTE — Progress Notes (Signed)
[facility] Progress Note  Patient: Veronica Krueger    YQI:[id number]  DOA: 03/09/2022     Date of Service: the patient was seen and examined on 03/11/2022  Chief Complaint  Patient presents with   Weakness   Brief hospital course:  HPI: Veronica Krueger is a 67 y.o. female with a known history of DM, HTN, anemia, lymphedema, migraines, MGUS, CKD presents to the emergency department for evaluation of weakness.  Patient reports that she has had a LLE wound that started in 2022 on the left lateral leg.  She has taken multiple courses of antibiotics for it, has been seeing wound care nurse twice weekly and has had intermittent flare ups of oozing, pain and redness.   Started Levaquin on Friday 3 days ago for LLE cellulitis. she started having foul smelling purulent drainage from the wound as well as increased pain.  She also reports new bilateral lower leg pain.  She is usually functionally independent, ambulates with a wheelchair but was having difficulty getting out of bed this morning.  MS/ED Course: Patient received Rocephin, Vanco. Medical admission has been requested for further management of LLE cellulitis.     Assessment and Plan:  # LLE cellulitis, acute on chronic Wound picture saved in the media section Continue IV antibiotics: Rocephin and Vanco, pharmacy managing S/p IV fluid for hydration,  - Wound care consult - Follow up wound cultures - Repeat CBC in am.  - Consider MRI / bone scan to eval for osteo if not improving,  Follow MRSA screen    # Bilateral lower extremity pain D-dimer 0.73 slightly elevated, venous duplex negative for DVT   # Generalized weakness likely 2/2 above - PT eval - Fall precautions   # H/o Diabetes, HbA1c 5.1, within normal range.  Well-controlled - Accuchecks achs with RISS coverage - Heart healthy, carb controlled diet   # History of normocytic anemia, chronic and stable 1/16 Hb 6.5, dropped, transfuse 1 unit of PRBC 1/17 Hb  7.1, slight lower end, transfuse 1 unit of PRBC Iron level 22, transferrin saturation 12%, slightly low.  Folate >40 above normal range.  Vitamin B12 level 267, goal >400, started vitamin B12 1000 mcg IM injection during hospital stay followed by oral supplement. Check FOBT to rule out GI bleeding - Monitor CBC - Continue iron    # History of HTN - Continue metoprolol   # History of HLD - No longer medicated   # History of lymphedema - Has compression boots, may use on R only at this time.    # History of CKD - Continue vitamins and minerals 1/17, elevated creatinine 1.43 Vancomycin adjusted by pharmacy Continue to monitor renal functions and urine output    # History of MGUS - Monitor  Morbid obesity Body mass index is 46.49 kg/m.  Interventions:     Diet: Heart healthy/carb modified DVT Prophylaxis: Subcutaneous Lovenox   Advance goals of care discussion: Full code  Family Communication: family was NOT present at bedside, at the time of interview.  The pt provided permission to discuss medical plan with the family. Opportunity was given to ask question and all questions were answered satisfactorily.   Disposition:  Pt is from Home, admitted with left lower extremity cellulitis, chronic nonhealing wound, anemia, hemoglobin was low, PRBC transfusion ordered, still has chronic left lower extremity wound on IV antibiotics, which precludes a safe discharge. Discharge TBD, PT eval pending, when clinically stable..  Subjective: No significant events overnight, denies any GI bleeding,  patient received 1 unit of PRBC blood transfusion yesterday, no any reaction.  Hemoglobin did not improve much, patient agreed with another unit of blood transfusion.  Wound was examined, pictures saved in the media section.  Patient denies any GI bleeding or dark stools.  Urine output is adequate, we will continue to monitor renal functions slightly deteriorated, informed patient.   Physical  Exam: General: NAD, lying comfortably Appear in no distress, affect appropriate Eyes: PERRLA ENT: Oral Mucosa Clear, moist  Neck: no JVD,  Cardiovascular: S1 and S2 Present, no Murmur,  Respiratory: good respiratory effort, Bilateral Air entry equal and Decreased, no Crackles, no wheezes Abdomen: Bowel Sound present, Soft and no tenderness,  Skin: no rashes Extremities: Chronic lymphedema of BL LEx, LLE wound wet with some drainage, pictures saved in the media section.   Neurologic: without any new focal findings Gait not checked due to patient safety concerns  Vitals:   03/11/22 0045 03/11/22 0100 03/11/22 0315 03/11/22 0722  BP: (!) 143/64 (!) 142/60 (!) 146/62 (!) 157/57  Pulse: 63 64 63 61  Resp: '18 18 18 20  '$ Temp: 98.7 F (37.1 C) 98.7 F (37.1 C) 98.1 F (36.7 C) 98.8 F (37.1 C)  TempSrc: Oral Oral Oral Oral  SpO2: 91% 93% 93% 95%  Weight:      Height:        Intake/Output Summary (Last 24 hours) at 03/11/2022 1209 Last data filed at 03/11/2022 0500 Gross per 24 hour  Intake 531 ml  Output 800 ml  Net -269 ml   Filed Weights   03/09/22 2050 03/09/22 2111  Weight: (!) 142.7 kg (!) 142.8 kg    Data Reviewed: I have personally reviewed and interpreted daily labs, tele strips, imagings as discussed above. I reviewed all nursing notes, pharmacy notes, vitals, pertinent old records I have discussed plan of care as described above with RN and patient/family.  CBC: Recent Labs  Lab 03/09/22 1546 03/10/22 0103 03/11/22 0607  WBC 6.6 5.5 3.9*  NEUTROABS 5.2  --   --   HGB 7.4* 6.5* 7.1*  HCT 24.9* 21.8* 23.4*  MCV 83.0 82.6 81.5  PLT 180 143* 144*   Basic Metabolic Panel: Recent Labs  Lab 03/09/22 1546 03/10/22 0103 03/10/22 0810 03/11/22 0607  NA 143 141  --  140  K 4.0 3.4*  --  3.7  CL 111 110  --  110  CO2 27 26  --  23  GLUCOSE 90 107*  --  77  BUN 14 12  --  18  CREATININE 1.08* 1.15*  --  1.43*  CALCIUM 9.1 8.5*  --  8.7*  MG  --   --  1.7  1.8  PHOS  --   --  3.4 3.5    Studies: No results found.  Scheduled Meds:  allopurinol  100 mg Oral Daily   vitamin C  500 mg Oral BID   calcium carbonate  1 tablet Oral TID   cyanocobalamin  1,000 mcg Intramuscular Daily   Followed by   Derrill Memo ON 03/18/2022] vitamin B-12  1,000 mcg Oral Daily   dorzolamide  1 drop Right Eye BID   enoxaparin (LOVENOX) injection  0.5 mg/kg Subcutaneous Q24H   ferrous sulfate  325 mg Oral Daily   insulin aspart  0-15 Units Subcutaneous TID WC   insulin aspart  0-5 Units Subcutaneous QHS   latanoprost  1 drop Right Eye QHS   metoprolol tartrate  25 mg Oral BID  multivitamin with minerals  1 tablet Oral Daily   pantoprazole  40 mg Oral BID   polyethylene glycol  17 g Oral Daily   sodium chloride flush  3 mL Intravenous Q12H   timolol  1 drop Both Eyes BID   Continuous Infusions:  cefTRIAXone (ROCEPHIN)  IV 2 g (03/10/22 1731)   vancomycin     PRN Meds: acetaminophen **OR** acetaminophen, bisacodyl, hydrALAZINE, HYDROcodone-acetaminophen, morphine injection, ondansetron **OR** ondansetron (ZOFRAN) IV, traZODone  Time spent: 55 minutes  Author: Val Riles. MD [facility] 03/11/2022 12:09 PM  To reach On-call, see care teams to locate the attending and reach out to them via [email or web address]. If 7PM-7AM, please contact night-coverage If you still have difficulty reaching the attending provider, please page the [facility] (Director on Call) for [facility] on amion for assistance.

## 2022-03-11 NOTE — Progress Notes (Signed)
Pt refusing all care this am. Refusing to be checked to see if she had BM, wet, or to be turned. Pt stated that she had called all night to have feet repositioned, but did not actually call at all this shift. When tech extended the bed to give pt more foot room, pt then said her feet were hanging off the bed when they were not.Tech then put pillow underneath feet. Tech then asked if foot positioning was satisfactory, pt stated, "I guess". Pt now resting with eyes closed.

## 2022-03-12 DIAGNOSIS — L03116 Cellulitis of left lower limb: Secondary | ICD-10-CM | POA: Diagnosis not present

## 2022-03-12 LAB — GLUCOSE, CAPILLARY
Glucose-Capillary: 86 mg/dL (ref 70–99)
Glucose-Capillary: 113 mg/dL — ABNORMAL HIGH (ref 70–99)
Glucose-Capillary: 133 mg/dL — ABNORMAL HIGH (ref 70–99)
Glucose-Capillary: 115 mg/dL — ABNORMAL HIGH (ref 70–99)

## 2022-03-12 LAB — BASIC METABOLIC PANEL
Anion gap: 7 (ref 5–15)
BUN: 23 mg/dL (ref 8–23)
CO2: 23 mmol/L (ref 22–32)
Calcium: 8.6 mg/dL — ABNORMAL LOW (ref 8.9–10.3)
Chloride: 110 mmol/L (ref 98–111)
Creatinine, Ser: 1.34 mg/dL — ABNORMAL HIGH (ref 0.44–1.00)
GFR, Estimated: 44 mL/min — ABNORMAL LOW (ref >60)
Glucose, Bld: 76 mg/dL (ref 70–99)
Potassium: 4.2 mmol/L (ref 3.5–5.1)
Sodium: 140 mmol/L (ref 135–145)

## 2022-03-12 LAB — MAGNESIUM: Magnesium: 2 mg/dL (ref 1.7–2.4)

## 2022-03-12 LAB — PHOSPHORUS: Phosphorus: 3.2 mg/dL (ref 2.5–4.6)

## 2022-03-12 LAB — CBC
HCT: 26.8 % — ABNORMAL LOW (ref 36.0–46.0)
Hemoglobin: 8.3 g/dL — ABNORMAL LOW (ref 12.0–15.0)
MCH: 25.1 pg — ABNORMAL LOW (ref 26.0–34.0)
MCHC: 31 g/dL (ref 30.0–36.0)
MCV: 81 fL (ref 80.0–100.0)
Platelets: 133 10*3/uL — ABNORMAL LOW (ref 150–400)
RBC: 3.31 MIL/uL — ABNORMAL LOW (ref 3.87–5.11)
RDW: 18.1 % — ABNORMAL HIGH (ref 11.5–15.5)
WBC: 5.2 10*3/uL (ref 4.0–10.5)
nRBC: 0 % (ref 0.0–0.2)

## 2022-03-12 NOTE — Plan of Care (Signed)
  Problem: Education: Goal: Knowledge of General Education information will improve Description: Including pain rating scale, medication(s)/side effects and non-pharmacologic comfort measures Outcome: Progressing   Problem: Nutrition: Goal: Adequate nutrition will be maintained Outcome: Progressing   Problem: Coping: Goal: Level of anxiety will decrease Outcome: Progressing   Problem: Elimination: Goal: Will not experience complications related to bowel motility Outcome: Progressing Goal: Will not experience complications related to urinary retention Outcome: Progressing   Problem: Pain Managment: Goal: General experience of comfort will improve Outcome: Progressing   Problem: Safety: Goal: Ability to remain free from injury will improve Outcome: Progressing   

## 2022-03-12 NOTE — Evaluation (Signed)
Occupational Therapy Evaluation Patient Details Name: Veronica Krueger MRN: [id number] DOB: [birth year] Today's Date: 03/12/2022   History of Present Illness 67 y/o female presented to ED on 03/09/22 for L thigh wound and weakness. Admitted for acute on chronic LLE cellulitis. PMH: T2DM, HTN, lymphedema   Clinical Impression   Veronica Krueger presents with generalized weakness, reduced endurance, impaired balance, and pain. For patient's and therapists' safety, these session conducted jointly by OT and PT. Pt lives alone, uses a WC for mobility, is able to transfer [facility] bed<>WC, sponge bathes at baseline. Friends provide assistance with shopping, transportation. During today's eval, pt requires Min-Mod A +2 for supine<sit, for assist w/ b/l LE. Able to don shoes INDly w/ increased time and effort sitting EOB, needs Min A for changing hospital gown. Pt's body habitus makes placement and retention of Purewick difficult, and pt found saturated in urine. Therapists problem-solved re: best way to move skin folds on abdomen and thighs for positioning of Purewick. Pt able to transfer from very elevated bed to recliner with CGA +2, w/ increased time and effort for repositioning RLE during transfer process. Question at this point whether pt will be able to perform bed mobility and transfer WC to BSC/toilet INDly, therefore recommend DC to SNF. If pt is able to move into/out of bed and to/from [facility] w/ Mod I, however, then Veronica Krueger could be appropriate. Will attempt these transfers during subsequent OT sessions.     Recommendations for follow up therapy are one component of a multi-disciplinary discharge planning process, led by the attending physician.  Recommendations may be updated based on patient status, additional functional criteria and insurance authorization.   Follow Up Recommendations  Skilled nursing-short term rehab (<3 hours/day)     Assistance Recommended at Discharge Intermittent Supervision/Assistance   Patient can return home with the following A lot of help with walking and/or transfers;A lot of help with bathing/dressing/bathroom;Assistance with cooking/housework;Assist for transportation    Functional Status Assessment  Patient has had a recent decline in their functional status and demonstrates the ability to make significant improvements in function in a reasonable and predictable amount of time.  Equipment Recommendations  None recommended by OT    Recommendations for Other Services       Precautions / Restrictions Precautions Precautions: Fall Precaution Comments: B LE lymphedema, L lower leg wound Restrictions Weight Bearing Restrictions: No      Mobility Bed Mobility Overal bed mobility: Needs Assistance Bed Mobility: Supine to Sit     Supine to sit: Min assist     General bed mobility comments: assist for LE management    Transfers Overall transfer level: Needs assistance Equipment used: None Transfers: Sit to/from Stand, Bed to chair/wheelchair/BSC Sit to Stand: Min guard, +2 safety/equipment, From elevated surface     Step pivot transfers: Min guard     General transfer comment: +2 for safety. Able to come into standing from elevated surface with min guard and take small pivotal steps towards recliner on R side      Balance Overall balance assessment: Needs assistance Sitting-balance support: No upper extremity supported, Feet supported Sitting balance-Veronica Krueger Scale: Good Sitting balance - Comments: able to don shoes while sitting   Standing balance support: Single extremity supported Standing balance-Veronica Krueger Scale: Fair                             ADL either performed or assessed with clinical judgement   ADL Overall  ADL's : Needs assistance/impaired                 Upper Body Dressing : Minimal assistance Upper Body Dressing Details (indicate cue type and reason): donning/doffing hospital gown Lower Body Dressing: Modified  independent Lower Body Dressing Details (indicate cue type and reason): requires increased time/effort                     Vision         Perception     Praxis      Pertinent Vitals/Pain Pain Assessment Faces Pain Scale: Hurts little more Pain Location: L LE Pain Descriptors / Indicators: Grimacing, Guarding Pain Intervention(s): Repositioned, Limited activity within patient's tolerance     Hand Dominance Right   Extremity/Trunk Assessment Upper Extremity Assessment Upper Extremity Assessment: Generalized weakness;LUE deficits/detail LUE Deficits / Details: hx of partial paralysis of L UE   Lower Extremity Assessment Lower Extremity Assessment: Generalized weakness       Communication Communication Communication: No difficulties   Cognition Arousal/Alertness: Awake/alert Behavior During Therapy: WFL for tasks assessed/performed Overall Cognitive Status: Within Functional Limits for tasks assessed                                       General Comments       Exercises Other Exercises Other Exercises: Educ re: transfer safety, AE for LB dressing, purewick mgmt   Shoulder Instructions      Home Living Family/patient expects to be discharged to:: Private residence Living Arrangements: Alone Available Help at Discharge: Friend(s) Type of Home: House Home Access: Ramped entrance     Home Layout: One level     Bathroom Shower/Tub: Hospital doctor Toilet: Handicapped height     Home Equipment: Conservation officer, nature (2 wheels);Wheelchair - manual   Additional Comments: Does transfers from bed to chair w/ RUE on hemi-walker; uses WC arm rails to transfer to/from shower seat      Prior Functioning/Environment Prior Level of Function : Needs assist             Mobility Comments: uses manual WC, IND w/ stand/pivot transfers to/from bed/WC with hemi-walker. Reports no falls in previous 14 years ADLs Comments: sponge bathes at  baseline. Can don shoes INDly, does not wear socks. Friend provides transport to medical appointments. Friend delivers groceries.        OT Problem List: Decreased strength;Decreased knowledge of use of DME or AE;Impaired tone;Increased edema;Decreased range of motion;Obesity;Decreased activity tolerance;Impaired UE functional use;Impaired balance (sitting and/or standing);Pain      OT Treatment/Interventions: Self-care/ADL training;Therapeutic exercise;Patient/family education;Balance training;Energy conservation;Therapeutic activities;DME and/or AE instruction    OT Goals(Current goals can be found in the care plan section) Acute Rehab OT Goals Patient Stated Goal: to go home OT Goal Formulation: With patient Time For Goal Achievement: 03/26/22 Potential to Achieve Goals: Good ADL Goals Pt Will Perform Grooming: sitting;bed level;with modified independence Pt Will Perform Upper Body Dressing: sitting;bed level;with modified independence Pt Will Perform Lower Body Dressing: with modified independence;sitting/lateral leans;with adaptive equipment Pt Will Transfer to Toilet: with modified independence;stand pivot transfer  OT Frequency: Min 2X/week    Co-evaluation              AM-PAC OT "6 Clicks" Daily Activity     Outcome Measure Help from another person eating meals?: None Help from another person taking care of personal  grooming?: A Little Help from another person toileting, which includes using toliet, bedpan, or urinal?: A Lot Help from another person bathing (including washing, rinsing, drying)?: A Lot Help from another person to put on and taking off regular upper body clothing?: A Little Help from another person to put on and taking off regular lower body clothing?: A Little 6 Click Score: 17   End of Session    Activity Tolerance: Patient tolerated treatment well Patient left: in chair;with call bell/phone within reach  OT Visit Diagnosis: Unsteadiness on feet  (R26.81);Other abnormalities of gait and mobility (R26.89);Muscle weakness (generalized) (M62.81);Pain Pain - Right/Left: Left Pain - part of body: Leg                Time: 7939-6886 OT Time Calculation (min): 48 min Charges:  OT General Charges $OT Visit: 1 Visit OT Evaluation $OT Eval Moderate Complexity: 1 Mod OT Treatments $Self Care/Home Management : 8-22 mins Josiah Lobo, PhD, MS, OTR/L 03/12/22, 1:12 PM

## 2022-03-12 NOTE — Progress Notes (Signed)
Wound care performed. Microbiology swab performed and sent to lab. Pictures taken of wound and uploaded to chart.

## 2022-03-12 NOTE — Progress Notes (Signed)
[facility] Progress Note  Patient: Veronica Krueger    HWE:[id number]  DOA: 03/09/2022     Date of Service: the patient was seen and examined on 03/12/2022  Chief Complaint  Patient presents with   Weakness   Brief hospital course:  HPI: Veronica Krueger is a 67 y.o. female with a known history of DM, HTN, anemia, lymphedema, migraines, MGUS, CKD presents to the emergency department for evaluation of weakness.  Patient reports that she has had a LLE wound that started in 2022 on the left lateral leg.  She has taken multiple courses of antibiotics for it, has been seeing wound care nurse twice weekly and has had intermittent flare ups of oozing, pain and redness.   Started Levaquin on Friday 3 days ago for LLE cellulitis. she started having foul smelling purulent drainage from the wound as well as increased pain.  She also reports new bilateral lower leg pain.  She is usually functionally independent, ambulates with a wheelchair but was having difficulty getting out of bed this morning.  MS/ED Course: Patient received Rocephin, Vanco. Medical admission has been requested for further management of LLE cellulitis.     Assessment and Plan:  # LLE cellulitis, acute on chronic Wound picture saved in the media section Continue IV antibiotics: Rocephin and Vanco, pharmacy managing S/p IV fluid for hydration,  - Wound care consult - Follow up wound cultures - Repeat CBC in am.  - Consider MRI / bone scan to eval for osteo if not improving,  MRSA screen negative    # Bilateral lower extremity pain D-dimer 0.73 slightly elevated, venous duplex negative for DVT   # Generalized weakness likely 2/2 above - PT eval - Fall precautions   # H/o Diabetes, HbA1c 5.1, within normal range.  Well-controlled - Accuchecks achs with RISS coverage - Heart healthy, carb controlled diet   # History of normocytic anemia, chronic and stable 1/16 Hb 6.5, dropped, transfuse 1 unit of PRBC 1/17  Hb 7.1, slight lower end, transfuse 1 unit of PRBC 1/18 Hb 8.3, stable Iron level 22, transferrin saturation 12%, slightly low.  Folate >40 above normal range.  Vitamin B12 level 267, goal >400, started vitamin B12 1000 mcg IM injection during hospital stay followed by oral supplement. Check FOBT to rule out GI bleeding, still not done - Monitor CBC - Continue iron    # History of HTN - Continue metoprolol   # History of HLD - No longer medicated   # History of lymphedema - Has compression boots, may use on R only at this time.    # History of CKD - Continue vitamins and minerals 1/17, elevated creatinine 1.43 --1.34 gradually improving Vancomycin adjusted by pharmacy Continue to monitor renal functions and urine output   # History of MGUS - Monitor  Morbid obesity Body mass index is 46.49 kg/m.  Interventions:     Diet: Heart healthy/carb modified DVT Prophylaxis: Subcutaneous Lovenox   Advance goals of care discussion: Full code  Family Communication: family was NOT present at bedside, at the time of interview.  The pt provided permission to discuss medical plan with the family. Opportunity was given to ask question and all questions were answered satisfactorily.   Disposition:  Pt is from Home, admitted with left lower extremity cellulitis, chronic nonhealing wound, anemia, hemoglobin was low, PRBC transfusion ordered, still has chronic left lower extremity wound on IV antibiotics, which precludes a safe discharge. Discharge to SNF as per PT and OT eval  but patient is refusing, may need to arrange home health PT, when clinically stable.  May need 1-2 more days  Subjective: No significant events overnight, denies any GI bleeding, denies any pain, we will check the wound again while dressing change.  Patient was seen by PT and OT, recommended SNF placement but patient is refusing she would like to go home. Currently patient is on IV antibiotics and creatinine slightly  elevated so we will monitor 1 more day and plan for disposition tomorrow a.m. if remains stable.   Physical Exam: General: NAD, lying comfortably Appear in no distress, affect appropriate Eyes: PERRLA ENT: Oral Mucosa Clear, moist  Neck: no JVD,  Cardiovascular: S1 and S2 Present, no Murmur,  Respiratory: good respiratory effort, Bilateral Air entry equal and Decreased, no Crackles, no wheezes Abdomen: Bowel Sound present, Soft and no tenderness,  Skin: no rashes Extremities: Chronic lymphedema of BL LEx, LLE wound, dressing CDI.   Neurologic: without any new focal findings Gait not checked due to patient safety concerns  Vitals:   03/12/22 0455 03/12/22 0712 03/12/22 0830 03/12/22 0905  BP: (!) 136/46 (!) 140/49 (!) 138/41 (!) 138/41  Pulse: 63 60 61 62  Resp: '16 16 15   '$ Temp: 98.9 F (37.2 C) 99.1 F (37.3 C) 98.7 F (37.1 C)   TempSrc: Oral Oral Oral   SpO2: 95% 96% 98%   Weight:      Height:        Intake/Output Summary (Last 24 hours) at 03/12/2022 1359 Last data filed at 03/12/2022 0830 Gross per 24 hour  Intake 516 ml  Output 950 ml  Net -434 ml   Filed Weights   03/09/22 2050 03/09/22 2111  Weight: (!) 142.7 kg (!) 142.8 kg    Data Reviewed: I have personally reviewed and interpreted daily labs, tele strips, imagings as discussed above. I reviewed all nursing notes, pharmacy notes, vitals, pertinent old records I have discussed plan of care as described above with RN and patient/family.  CBC: Recent Labs  Lab 03/09/22 1546 03/10/22 0103 03/11/22 0607 03/12/22 0954  WBC 6.6 5.5 3.9* 5.2  NEUTROABS 5.2  --   --   --   HGB 7.4* 6.5* 7.1* 8.3*  HCT 24.9* 21.8* 23.4* 26.8*  MCV 83.0 82.6 81.5 81.0  PLT 180 143* 122* 921*   Basic Metabolic Panel: Recent Labs  Lab 03/09/22 1546 03/10/22 0103 03/10/22 0810 03/11/22 0607 03/12/22 0340  NA 143 141  --  140 140  K 4.0 3.4*  --  3.7 4.2  CL 111 110  --  110 110  CO2 27 26  --  23 23  GLUCOSE 90  107*  --  77 76  BUN 14 12  --  18 23  CREATININE 1.08* 1.15*  --  1.43* 1.34*  CALCIUM 9.1 8.5*  --  8.7* 8.6*  MG  --   --  1.7 1.8 2.0  PHOS  --   --  3.4 3.5 3.2    Studies: No results found.  Scheduled Meds:  allopurinol  100 mg Oral Daily   vitamin C  500 mg Oral BID   calcium carbonate  1 tablet Oral TID   cyanocobalamin  1,000 mcg Intramuscular Daily   Followed by   Derrill Memo ON 03/18/2022] vitamin B-12  1,000 mcg Oral Daily   dorzolamide  1 drop Right Eye BID   enoxaparin (LOVENOX) injection  0.5 mg/kg Subcutaneous Q24H   ferrous sulfate  325 mg Oral Daily  insulin aspart  0-15 Units Subcutaneous TID WC   insulin aspart  0-5 Units Subcutaneous QHS   latanoprost  1 drop Right Eye QHS   metoprolol tartrate  25 mg Oral BID   multivitamin with minerals  1 tablet Oral Daily   pantoprazole  40 mg Oral BID   polyethylene glycol  17 g Oral Daily   sodium chloride flush  3 mL Intravenous Q12H   timolol  1 drop Both Eyes BID   Continuous Infusions:  cefTRIAXone (ROCEPHIN)  IV 2 g (03/11/22 1849)   vancomycin 1,250 mg (03/12/22 1354)   PRN Meds: acetaminophen **OR** acetaminophen, bisacodyl, hydrALAZINE, HYDROcodone-acetaminophen, morphine injection, ondansetron **OR** ondansetron (ZOFRAN) IV, traZODone  Time spent: 40 minutes  Author: Val Riles. MD [facility] 03/12/2022 1:59 PM  To reach On-call, see care teams to locate the attending and reach out to them via [email or web address]. If 7PM-7AM, please contact night-coverage If you still have difficulty reaching the attending provider, please page the [facility] (Director on Call) for [facility] on amion for assistance.

## 2022-03-12 NOTE — Care Management Important Message (Signed)
Important Message  Patient Details  Name: Veronica Krueger MRN: [id number] Date of Birth: 1955/12/21   Medicare Important Message Given:  N/A - LOS <3 / Initial given by admissions     Juliann Pulse A Cadence Haslam 03/12/2022, 7:40 AM

## 2022-03-12 NOTE — Progress Notes (Signed)
Pt sat up in the chair all day and refused to get back in bed for wound care, daily wound care will be deferred for next shift for when she is back in the bed.

## 2022-03-12 NOTE — Progress Notes (Signed)
Physical Therapy Treatment Patient Details Name: Veronica Krueger MRN: [id number] DOB: 05/19/1955 Today's Date: 03/12/2022   History of Present Illness 67 y/o female presented to ED on 03/09/22 for L thigh wound and weakness. Admitted for acute on chronic LLE cellulitis. PMH: T2DM, HTN, lymphedema (Simultaneous filing. User may not have seen previous data.)    PT Comments    Patient progressing towards physical therapy goals. Seen in conjunction with OT to maximize patient's tolerance and safety for OOB mobility. Found saturated in urine due to malfunctioning purewick. Able to complete bed mobility with minA. Increased time to problem solve transfer to recliner but able to complete sit to stand from elevated surface with min guard and take pivotal steps to recliner with min guard. +2 for safety throughout. Patient seems pleased with current progress. Encouraged OOB mobility to [facility] and chair during the day with nursing assistance. Continue to recommend SNF for ongoing Physical Therapy. May be able to progress to home with [facility] if patient able to demonstrate ability to transfer to/from recliner/BSC and get in/out of bed independently.    Recommendations for follow up therapy are one component of a multi-disciplinary discharge planning process, led by the attending physician.  Recommendations may be updated based on patient status, additional functional criteria and insurance authorization.  Follow Up Recommendations  Skilled nursing-short term rehab (<3 hours/day) Can patient physically be transported by private vehicle: No   Assistance Recommended at Discharge Frequent or constant Supervision/Assistance  Patient can return home with the following A lot of help with walking and/or transfers;A lot of help with bathing/dressing/bathroom;Assistance with cooking/housework;Assist for transportation;Help with stairs or ramp for entrance   Equipment Recommendations  None recommended by PT     Recommendations for Other Services       Precautions / Restrictions Precautions Precautions: Fall (Simultaneous filing. User may not have seen previous data.) Precaution Comments: B LE lymphedema, L lower leg wound Restrictions Weight Bearing Restrictions: No (Simultaneous filing. User may not have seen previous data.)     Mobility  Bed Mobility Overal bed mobility: Needs Assistance Bed Mobility: Supine to Sit     Supine to sit: Min assist     General bed mobility comments: assist for LE management    Transfers Overall transfer level: Needs assistance Equipment used: None Transfers: Sit to/from Stand, Bed to chair/wheelchair/BSC Sit to Stand: Min guard, +2 safety/equipment, From elevated surface   Step pivot transfers: Min guard, +2 safety/equipment       General transfer comment: +2 for safety. Able to come into standing from elevated surface with min guard and take pivotal steps towards recliner on R side    Ambulation/Gait                   Stairs             Wheelchair Mobility    Modified Rankin (Stroke Patients Only)       Balance Overall balance assessment: Needs assistance Sitting-balance support: No upper extremity supported, Feet supported Sitting balance-Leahy Scale: Good Sitting balance - Comments: able to don shoes while sitting   Standing balance support: Single extremity supported Standing balance-Leahy Scale: Fair                              Cognition Arousal/Alertness: Awake/alert Behavior During Therapy: WFL for tasks assessed/performed Overall Cognitive Status: Within Functional Limits for tasks assessed  Exercises      General Comments        Pertinent Vitals/Pain Pain Assessment Pain Assessment: Faces Faces Pain Scale: Hurts little more Pain Location: L LE Pain Descriptors / Indicators: Grimacing, Guarding Pain Intervention(s):  Monitored during session    Home Living Family/patient expects to be discharged to:: Private residence Living Arrangements: Alone Available Help at Discharge: Friend(s) Type of Home: House Home Access: Ramped entrance       Home Layout: One level Home Equipment: Conservation officer, nature (2 wheels);Wheelchair - manual Additional Comments: Does transfers from bed to chair w/ RUE on hemi-Arad Burston; uses WC arm rails to transfer to/from shower seat    Prior Function            PT Goals (current goals can now be found in the care plan section) Acute Rehab PT Goals Patient Stated Goal: did not state PT Goal Formulation: With patient Time For Goal Achievement: 03/25/22 Potential to Achieve Goals: Fair Progress towards PT goals: Progressing toward goals    Frequency    Min 2X/week      PT Plan Current plan remains appropriate    Co-evaluation              AM-PAC PT "6 Clicks" Mobility   Outcome Measure  Help needed turning from your back to your side while in a flat bed without using bedrails?: A Little Help needed moving from lying on your back to sitting on the side of a flat bed without using bedrails?: Total Help needed moving to and from a bed to a chair (including a wheelchair)?: A Little Help needed standing up from a chair using your arms (e.g., wheelchair or bedside chair)?: A Little Help needed to walk in hospital room?: Total Help needed climbing 3-5 steps with a railing? : Total 6 Click Score: 12    End of Session   Activity Tolerance: Patient tolerated treatment well Patient left: in chair;with call bell/phone within reach Nurse Communication: Mobility status PT Visit Diagnosis: Unsteadiness on feet (R26.81);Muscle weakness (generalized) (M62.81);Other abnormalities of gait and mobility (R26.89)     Time: 0177-9390 PT Time Calculation (min) (ACUTE ONLY): 48 min  Charges:  $Therapeutic Activity: 23-37 mins                     Shravya Wickwire A. Gilford Rile PT,  DPT [facility] - Acute Rehabilitation Services    Shakeda Pearse A Keirston Saephanh 03/12/2022, 1:01 PM

## 2022-03-12 NOTE — Consult Note (Signed)
WOC consult requested for left leg.  This was already performed on 1/16; please refer to progress notes for assessment and measurements, and topical treatment orders have been provided for bedside nurses to perform. Please re-consult if further assistance is needed.  Thank-you,  Julien Girt MSN, [employer], [place], [place], [place]

## 2022-03-13 DIAGNOSIS — L03116 Cellulitis of left lower limb: Secondary | ICD-10-CM | POA: Diagnosis not present

## 2022-03-13 LAB — TYPE AND SCREEN
ABO/RH(D): A POS
Antibody Screen: NEGATIVE
Unit division: 0
Unit division: 0

## 2022-03-13 LAB — CBC
HCT: 26.4 % — ABNORMAL LOW (ref 36.0–46.0)
Hemoglobin: 8.1 g/dL — ABNORMAL LOW (ref 12.0–15.0)
MCH: 24.8 pg — ABNORMAL LOW (ref 26.0–34.0)
MCHC: 30.7 g/dL (ref 30.0–36.0)
MCV: 81 fL (ref 80.0–100.0)
Platelets: 128 10*3/uL — ABNORMAL LOW (ref 150–400)
RBC: 3.26 MIL/uL — ABNORMAL LOW (ref 3.87–5.11)
RDW: 18.1 % — ABNORMAL HIGH (ref 11.5–15.5)
WBC: 5.6 10*3/uL (ref 4.0–10.5)
nRBC: 0.4 % — ABNORMAL HIGH (ref 0.0–0.2)

## 2022-03-13 LAB — BPAM RBC
Blood Product Expiration Date: 202402112359
Blood Product Expiration Date: 202402142359
ISSUE DATE / TIME: 202401170041
ISSUE DATE / TIME: 202401180505
Unit Type and Rh: 6200
Unit Type and Rh: 6200

## 2022-03-13 LAB — BASIC METABOLIC PANEL
Anion gap: 7 (ref 5–15)
BUN: 23 mg/dL (ref 8–23)
CO2: 21 mmol/L — ABNORMAL LOW (ref 22–32)
Calcium: 8.7 mg/dL — ABNORMAL LOW (ref 8.9–10.3)
Chloride: 111 mmol/L (ref 98–111)
Creatinine, Ser: 1.25 mg/dL — ABNORMAL HIGH (ref 0.44–1.00)
GFR, Estimated: 48 mL/min — ABNORMAL LOW (ref >60)
Glucose, Bld: 118 mg/dL — ABNORMAL HIGH (ref 70–99)
Potassium: 4.1 mmol/L (ref 3.5–5.1)
Sodium: 139 mmol/L (ref 135–145)

## 2022-03-13 LAB — GLUCOSE, CAPILLARY
Glucose-Capillary: 95 mg/dL (ref 70–99)
Glucose-Capillary: 108 mg/dL — ABNORMAL HIGH (ref 70–99)

## 2022-03-13 LAB — MAGNESIUM: Magnesium: 1.9 mg/dL (ref 1.7–2.4)

## 2022-03-13 LAB — PHOSPHORUS: Phosphorus: 3.2 mg/dL (ref 2.5–4.6)

## 2022-03-13 MED ORDER — VANCOMYCIN HCL 1500 MG/300ML IV SOLN
1500.0000 mg | INTRAVENOUS | Status: DC
  Administered 2022-03-13: 1500 mg via INTRAVENOUS
  Filled 2022-03-13: qty 300

## 2022-03-13 MED ORDER — ASCORBIC ACID 500 MG PO TABS: 500.0000 mg | ORAL_TABLET | Freq: Two times a day (BID) | ORAL | 0 refills | Status: AC

## 2022-03-13 MED ORDER — PANTOPRAZOLE SODIUM 40 MG PO TBEC: 40.0000 mg | DELAYED_RELEASE_TABLET | Freq: Every day | ORAL | 2 refills | Status: AC

## 2022-03-13 MED ORDER — BISACODYL 5 MG PO TBEC: 10.0000 mg | DELAYED_RELEASE_TABLET | Freq: Every day | ORAL | 0 refills | Status: DC | PRN

## 2022-03-13 MED ORDER — POLYETHYLENE GLYCOL 3350 17 G PO PACK: 17.0000 g | PACK | Freq: Every day | ORAL | 0 refills | Status: DC

## 2022-03-13 MED ORDER — DOXYCYCLINE HYCLATE 50 MG PO CAPS: 50.0000 mg | ORAL_CAPSULE | Freq: Two times a day (BID) | ORAL | 0 refills | Status: AC

## 2022-03-13 MED ORDER — CYANOCOBALAMIN 1000 MCG PO TABS: 1000.0000 ug | ORAL_TABLET | Freq: Every day | ORAL | 0 refills | Status: AC

## 2022-03-13 MED ORDER — CEFDINIR 300 MG PO CAPS: 300.0000 mg | ORAL_CAPSULE | Freq: Two times a day (BID) | ORAL | 0 refills | Status: AC

## 2022-03-13 NOTE — TOC Initial Note (Signed)
Transition of Care [facility]) - Initial/Assessment Note    Patient Details  Name: Veronica Krueger MRN: [id number] Date of Birth: 01-22-1956  [facility] CM/SW Contact:    Gerilyn Pilgrim, LCSW Phone Number: 03/13/2022, 10:17 AM  Clinical Narrative:  SW spoke with patient about going to SNF. Pt reports she is not intersted in this as she has been taking care of herself for fourteen years and does not see SNF necessary. Pt is interested in resuming care with [facility] for PT/OT/RN. Cheryl with [facility] contacted. Pt reports she has a wheelchair at home and has a handicap accessible home. Pt does live alone but reports she has a friend that will assist with transport. Pt sees Dr. Enid Derry for primary care. Pt interested in a brace for her left foot. Pt reports she went to a medical supply store in [place] and was given the wrong thing and hoping to get this at the hospital. SW messaged MD and PT/OT to see if this Is a possibility.                  Expected Discharge Plan: [place] Barriers to Discharge: Barriers Resolved   Patient Goals and CMS Choice Patient states their goals for this hospitalization and ongoing recovery are:: return home with home health CMS Medicare.gov Compare Post Acute Care list provided to:: Patient        Expected Discharge Plan and Services     Post Acute Care Choice: [place] arrangements for the past 2 months: [place] Expected Discharge Date: 03/13/22                         HH Arranged: PT, OT, RN [place] Agency: [place] Date [facility] Agency Contacted: 03/13/22 Time HH Agency Contacted: [phone]    Prior Living Arrangements/Services Living arrangements for the past 2 months: Single Family Home Lives with:: Self Patient language and need for interpreter reviewed:: Yes Do you feel safe going back to the place where you live?: Yes      Need for Family Participation in Patient Care: Yes  (Comment) Care giver support system in place?: Yes (comment) Current home services: Home OT, Home PT, Home RN    Activities of Daily Living Home Assistive Devices/Equipment: Wheelchair ADL Screening (condition at time of admission) Patient's cognitive ability adequate to safely complete daily activities?: Yes Is the patient deaf or have difficulty hearing?: No Does the patient have difficulty seeing, even when wearing glasses/contacts?: No Does the patient have difficulty concentrating, remembering, or making decisions?: No Patient able to express need for assistance with ADLs?: Yes Does the patient have difficulty dressing or bathing?: No Independently performs ADLs?: Yes (appropriate for developmental age) Does the patient have difficulty walking or climbing stairs?: Yes Weakness of Legs: Both Weakness of Arms/Hands: Both  Permission Sought/Granted                  Emotional Assessment       Orientation: : Oriented to Self, Oriented to Place, Oriented to  Time, Oriented to Situation      Admission diagnosis:  Cellulitis of left lower extremity [L03.116] Generalized weakness [R53.1] Patient Active Problem List   Diagnosis Date Noted   Cellulitis of left lower extremity 03/09/2022   Generalized weakness 03/09/2022   MGUS (monoclonal gammopathy of unknown significance) 02/26/2022   Lymphedema 02/24/2022   CKD (chronic kidney disease) stage 3, GFR 30-59 ml/min ([place])  02/04/2022   AKI (acute kidney injury) ([place]) 01/06/2021   Hyperkalemia 01/05/2021   Anemia in chronic kidney disease (CKD) 02/09/2018   Diabetes ([place]) 02/08/2018   Essential hypertension, benign 02/08/2018   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: [phone]: [phone]    Social Determinants of Health (SDOH) Social  History: SDOH Screenings   Food Insecurity: No Food Insecurity (03/09/2022)  Housing: Low Risk  (03/09/2022)  Transportation Needs: No Transportation Needs (03/09/2022)  Utilities: Not At Risk (03/09/2022)  Tobacco Use: Low Risk  (03/09/2022)   SDOH Interventions: Food Insecurity Interventions: Intervention Not Indicated   Readmission Risk Interventions    03/13/2022   10:16 AM  Readmission Risk Prevention Plan  Transportation Screening Complete  PCP or Specialist Appt within 5-7 Days Complete  Home Care Screening Complete  Medication Review (RN CM) Complete

## 2022-03-13 NOTE — Progress Notes (Signed)
Pharmacy Antibiotic Note  Veronica Krueger is a 67 y.o. female admitted on 03/09/2022 with cellulitis of LLE.  She has a PMH of DM, HTN, lymphedema, and CKD and has taken multiple courses of antibiotics prior to admission. Pharmacy has been consulted for Vancomycin dosing. Patient is also receiving ceftriaxone 2g every 24 hours.   Scr decreased 1.34>>1.25 -Day 5 IV abx -1/18 Wound BW:GYKZ GPC   Plan: Will adjust vancomycin regimen to Vancomycin 1500 mg IV Q 24 hrs.  Goal AUC 400-550. Expected AUC: 481 SCr used: 1.25 Cmin  12.1  Will monitor Scr daily for now and adjust dose as needed.   Height: '5\' 9"'$  (175.3 cm) Weight: (!) 142.8 kg (314 lb 13.1 oz) IBW/kg (Calculated) : 66.2  Temp (24hrs), Avg:98.6 F (37 C), Min:98.3 F (36.8 C), Max:98.8 F (37.1 C)  Recent Labs  Lab 03/09/22 1546 03/10/22 0103 03/11/22 0607 03/12/22 0340 03/12/22 0954 03/13/22 0545 03/13/22 0652  WBC 6.6 5.5 3.9*  --  5.2  --  5.6  CREATININE 1.08* 1.15* 1.43* 1.34*  --  1.25*  --      Estimated Creatinine Clearance: 69.5 mL/min (A) (by C-G formula based on SCr of 1.25 mg/dL (H)).    Allergies  Allergen Reactions   Penicillins Itching, Swelling and Other (See Comments)    Patient has tolerated Ceftriaxone   Has patient had a PCN reaction causing immediate rash, facial/tongue/throat swelling, SOB or lightheadedness with hypotension:  Has patient had a PCN reaction causing severe rash involving mucus membranes or skin necrosis:  Has patient had a PCN reaction that required hospitalization: Has patient had a PCN reaction occurring within the last 10 years: No If all of the above answers are "NO", then may proceed with Cephalosporin use.    Gabapentin Other (See Comments) and Itching    Hair loss  Hair loss Other reaction(s): Other (See Comments), Other (See Comments), Other (See Comments) Hair loss Hair loss Hair loss Hair loss Hair loss Hair loss Hair loss Hair loss Hair loss Hair  loss     Antimicrobials this admission: 1/15 Vancomycin  >>  1/15 Ceftriaxone >>   Dose adjustments this admission: 1/17 vancomycin 1750 Q24H changed to Vancomycin 1250 Q24H 1/19: vanc chg from '1250mg'$  to '1500mg'$  Q24h  Microbiology results: -1/18 Wound LD:JTTS GPC  Thank you for allowing pharmacy to be a part of this patient's care.  Noralee Space, PharmD Clinical Pharmacist 03/13/2022 8:47 AM

## 2022-03-13 NOTE — Discharge Summary (Signed)
[facility] Discharge Summary   Patient: Veronica Krueger UXN:[id number]  PCP: Mechele Claude, FNP  Date of admission: 03/09/2022   Date of discharge: 03/13/2022 03/14/2022     Discharge Diagnoses:  Principal Problem:   Cellulitis of left lower extremity Active Problems:   Generalized weakness   Admitted From: Home Disposition:  Home with HHPT  Recommendations for Outpatient Follow-up:  F/u with PCP in 1 wk Continue wound care f/u with wound care clinic, may need skin graft if no healing Follow up LABS/TEST:  None   Diet recommendation: Cardiac diet  Activity: The patient is advised to gradually reintroduce usual activities, as tolerated  Discharge Condition: stable  Code Status: Full code   History of present illness: As per the H and P dictated on admission  Hospital Course:  PI: Aydin Cavalieri is a 67 y.o. female with a known history of DM, HTN, anemia, lymphedema, migraines, MGUS, CKD presents to the emergency department for evaluation of weakness.  Patient reports that she has had a LLE wound that started in 2022 on the left lateral leg.  She has taken multiple courses of antibiotics for it, has been seeing wound care nurse twice weekly and has had intermittent flare ups of oozing, pain and redness.   Started Levaquin on Friday 3 days ago for LLE cellulitis. she started having foul smelling purulent drainage from the wound as well as increased pain.  She also reports new bilateral lower leg pain.  She is usually functionally independent, ambulates with a wheelchair but was having difficulty getting out of bed this morning.  MS/ED Course: Patient received Rocephin, Vanco. Medical admission has been requested for further management of LLE cellulitis.        Assessment and Plan: # LLE cellulitis, acute on chronic Wound picture saved in the media section. S/p IV antibiotics: Rocephin and Vanco, pharmacy managing. S/p IV fluid for hydration, Wound care consulted  for dressing change. wound cultures, rare Staph aureus, diphtheroids, culture is not final yet, ID incubated for better growth until 03/14/2022.  Patient was discharged on oral antibiotics Omnicef 3 mg p.o. twice daily for 7 days and doxycycline 100 mg p.o. twice daily.  Patient was advised to follow with PCP in 1 week and follow with the wound care center with an MD as well.  Continue dressing change at home. # Bilateral lower extremity pain, D-dimer 0.73 slightly elevated, venous duplex negative for DVT, no signs and symptoms of PE. # Generalized weakness likely 2/2 above, PT and OT eval was done, recommended SNF placement but patient refused and wanted home health PT set up. # H/o Diabetes, HbA1c 5.1, within normal range.  Well-controlled, currently patient is not on any medication.  Continue diet control. # History of normocytic anemia, chronic and stable On 1/16 Hb 6.5, dropped, transfuse 1 unit of PRBC, On 1/17 Hb 7.1, slight lower end, transfuse 1 unit of PRBC, On 1/18 Hb 8.3, stable. Iron level 22, transferrin saturation 12%, slightly low.  Continued oral iron supplement.  Folate >40 above normal range.  Vitamin B12 level 267, goal >400, started vitamin B12 1000 mcg IM injection during hospital stay followed by oral supplement. # History of HTN, Continue metoprolol # History of HLD, No longer medicated # History of lymphedema, Has compression boots, may use on R only at this time.  # History of CKD, Continue vitamins and minerals. On 1/17 elevated creatinine 1.43 --1.34 --1.25 gradually improving, Vancomycin was adjusted by pharmacy.  Patient was advised to  continue oral hydration. # History of MGUS, Monitor f/u outpatient with hematology. # Morbid obesity Body mass index is 46.49 kg/m.  Interventions:   Patient was seen by physical therapy, who recommended Home health, which was arranged. On the day of the discharge the patient's vitals were stable, and no other acute medical condition  were reported by patient. the patient was felt safe to be discharge at Home with Home health.  Consultants: Wound care RN Procedures: None  Discharge Exam: General: Appear in no distress, no Rash; Oral Mucosa Clear, moist. Cardiovascular: S1 and S2 Present, no Murmur, Respiratory: normal respiratory effort, Bilateral Air entry present and no Crackles, no wheezes Abdomen: Bowel Sound present, Soft and no tenderness, no hernia Extremities: mild Pedal edema, no calf tenderness, LLE chronic wound.  Please look at the pictures in the media section Neurology: alert and oriented to time, place, and person affect appropriate.  Filed Weights   03/09/22 2050 03/09/22 2111  Weight: (!) 142.7 kg (!) 142.8 kg   Vitals:   03/13/22 0749 03/13/22 1554  BP: 138/63 (!) 175/95  Pulse: 70 72  Resp: 19 18  Temp: 98.7 F (37.1 C) 98.7 F (37.1 C)  SpO2: 94% 95%    DISCHARGE MEDICATION: Allergies as of 03/13/2022       Reactions   Penicillins Itching, Swelling, Other (See Comments)   Patient has tolerated Ceftriaxone  Has patient had a PCN reaction causing immediate rash, facial/tongue/throat swelling, SOB or lightheadedness with hypotension:  Has patient had a PCN reaction causing severe rash involving mucus membranes or skin necrosis:  Has patient had a PCN reaction that required hospitalization: Has patient had a PCN reaction occurring within the last 10 years: No If all of the above answers are "NO", then may proceed with Cephalosporin use.   Gabapentin Other (See Comments), Itching   Hair loss Hair loss Other reaction(s): Other (See Comments), Other (See Comments), Other (See Comments) Hair loss Hair loss Hair loss Hair loss Hair loss Hair loss Hair loss Hair loss Hair loss Hair loss        Medication List     STOP taking these medications    Aimovig (140 MG Dose) 70 MG/ML Soaj Generic drug: Erenumab-aooe   calcium carbonate 1250 (500 Ca) MG chewable tablet Commonly  known as: OS-CAL   celecoxib 200 MG capsule Commonly known as: CELEBREX   levofloxacin 750 MG tablet Commonly known as: LEVAQUIN   rosuvastatin 10 MG tablet Commonly known as: CRESTOR   topiramate 25 MG tablet Commonly known as: TOPAMAX       TAKE these medications    acetaminophen 500 MG tablet Commonly known as: TYLENOL Take 1,000 mg by mouth every 6 (six) hours as needed for moderate pain or headache.   allopurinol 100 MG tablet Commonly known as: ZYLOPRIM Take 100 mg by mouth daily.   ascorbic acid 500 MG tablet Commonly known as: VITAMIN C Take 1 tablet (500 mg total) by mouth 2 (two) times daily.   b complex vitamins capsule Take 1 capsule by mouth daily.   bisacodyl 5 MG EC tablet Commonly known as: DULCOLAX Take 2 tablets (10 mg total) by mouth daily as needed for moderate constipation.   cefdinir 300 MG capsule Commonly known as: OMNICEF Take 1 capsule (300 mg total) by mouth 2 (two) times daily for 7 days.   cyanocobalamin 1000 MCG tablet Take 1 tablet (1,000 mcg total) by mouth daily.   dorzolamide 2 % ophthalmic solution Commonly known as:  TRUSOPT Place 1 drop into the right eye 2 (two) times daily.   doxycycline 50 MG capsule Commonly known as: VIBRAMYCIN Take 1 capsule (50 mg total) by mouth 2 (two) times daily for 7 days.   ferrous sulfate 325 (65 FE) MG tablet Take 325 mg by mouth daily.   glucose blood test strip 1 each by Other route as needed for other. Use as instructed   HYDROcodone-acetaminophen 5-325 MG tablet Commonly known as: NORCO/VICODIN Take 1 tablet by mouth 2 (two) times daily.   latanoprost 0.005 % ophthalmic solution Commonly known as: XALATAN Place 1 drop into the right eye at bedtime.   metoprolol tartrate 25 MG tablet Commonly known as: LOPRESSOR Take 25 mg by mouth 2 (two) times daily.   multivitamin tablet Take 1 tablet by mouth daily.   pantoprazole 40 MG tablet Commonly known as: PROTONIX Take 1 tablet  (40 mg total) by mouth daily.   polyethylene glycol 17 g packet Commonly known as: MIRALAX / GLYCOLAX Take 17 g by mouth daily.   timolol 0.5 % ophthalmic solution Commonly known as: TIMOPTIC Place 1 drop into both eyes 2 (two) times daily.   Vitamin D (Ergocalciferol) 1.25 MG (50000 UNIT) Caps capsule Commonly known as: DRISDOL Take 50,000 Units by mouth once a week.               Discharge Care Instructions  (From admission, onward)           Start     Ordered   03/13/22 0000  Discharge wound care:       Comments: As above   03/13/22 1012           Allergies  Allergen Reactions   Penicillins Itching, Swelling and Other (See Comments)    Patient has tolerated Ceftriaxone   Has patient had a PCN reaction causing immediate rash, facial/tongue/throat swelling, SOB or lightheadedness with hypotension:  Has patient had a PCN reaction causing severe rash involving mucus membranes or skin necrosis:  Has patient had a PCN reaction that required hospitalization: Has patient had a PCN reaction occurring within the last 10 years: No If all of the above answers are "NO", then may proceed with Cephalosporin use.    Gabapentin Other (See Comments) and Itching    Hair loss  Hair loss Other reaction(s): Other (See Comments), Other (See Comments), Other (See Comments) Hair loss Hair loss Hair loss Hair loss Hair loss Hair loss Hair loss Hair loss Hair loss Hair loss    Discharge Instructions     Call MD for:  difficulty breathing, headache or visual disturbances   Complete by: As directed    Call MD for:  extreme fatigue   Complete by: As directed    Call MD for:  persistant dizziness or light-headedness   Complete by: As directed    Call MD for:  severe uncontrolled pain   Complete by: As directed    Call MD for:  temperature >100.4   Complete by: As directed    Diet - low sodium heart healthy   Complete by: As directed    Discharge instructions    Complete by: As directed    F/u with PCP in 1 wk Continue wound care f/u with wound care clinic, may need skin graft if no healing   Discharge wound care:   Complete by: As directed    As above   Increase activity slowly   Complete by: As directed  The results of significant diagnostics from this hospitalization (including imaging, microbiology, ancillary and laboratory) are listed below for reference.    Significant Diagnostic Studies: US Venous Img Lower Bilateral (DVT)  Result Date: 03/10/2022 CLINICAL DATA:  Bilateral lower extremity edema.  Evaluate for DVT. EXAM: BILATERAL LOWER EXTREMITY VENOUS DOPPLER ULTRASOUND TECHNIQUE: Gray-scale sonography with graded compression, as well as color Doppler and duplex ultrasound were performed to evaluate the lower extremity deep venous systems from the level of the common femoral vein and including the common femoral, femoral, profunda femoral, popliteal and calf veins including the posterior tibial, peroneal and gastrocnemius veins when visible. The superficial great saphenous vein was also interrogated. Spectral Doppler was utilized to evaluate flow at rest and with distal augmentation maneuvers in the common femoral, femoral and popliteal veins. COMPARISON:  None Available. FINDINGS: RIGHT LOWER EXTREMITY Common Femoral Vein: No evidence of thrombus. Normal compressibility, respiratory phasicity and response to augmentation. Saphenofemoral Junction: No evidence of thrombus. Normal compressibility and flow on color Doppler imaging. Profunda Femoral Vein: No evidence of thrombus. Normal compressibility and flow on color Doppler imaging. Femoral Vein: No evidence of thrombus. Normal compressibility, respiratory phasicity and response to augmentation. Popliteal Vein: No evidence of thrombus. Normal compressibility, respiratory phasicity and response to augmentation. Calf Veins: No evidence of thrombus. Normal compressibility and flow on color  Doppler imaging. Superficial Great Saphenous Vein: No evidence of thrombus. Normal compressibility. Other Findings:  None. LEFT LOWER EXTREMITY Common Femoral Vein: No evidence of thrombus. Normal compressibility, respiratory phasicity and response to augmentation. Saphenofemoral Junction: No evidence of thrombus. Normal compressibility and flow on color Doppler imaging. Profunda Femoral Vein: No evidence of thrombus. Normal compressibility and flow on color Doppler imaging. Femoral Vein: No evidence of thrombus. Normal compressibility, respiratory phasicity and response to augmentation. Popliteal Vein: No evidence of thrombus. Normal compressibility, respiratory phasicity and response to augmentation. Calf Veins: No evidence of thrombus. Normal compressibility and flow on color Doppler imaging. Superficial Great Saphenous Vein: No evidence of thrombus. Normal compressibility. Other Findings:  None. IMPRESSION: No evidence of DVT within either lower extremity. Electronically Signed   By: Sandi Mariscal M.D.   On: 03/10/2022 12:10   DG Tibia/Fibula Left  Result Date: 03/09/2022 CLINICAL DATA:  Lower extremity infection EXAM: LEFT TIBIA AND FIBULA - 2 VIEW COMPARISON:  None Available. FINDINGS: Frontal and lateral views of the left tibia and fibula are obtained. There is diffuse subcutaneous edema, most pronounced in the lateral calf and medial ankle. No subcutaneous gas or radiopaque foreign body. There are no acute or destructive bony lesions. IMPRESSION: 1. Diffuse subcutaneous edema. No radiopaque foreign body or subcutaneous gas. 2. No acute or destructive bony abnormality. Electronically Signed   By: Randa Ngo M.D.   On: 03/09/2022 17:20   DG Chest Port 1 View  Result Date: 03/09/2022 CLINICAL DATA:  Weakness EXAM: PORTABLE CHEST 1 VIEW COMPARISON:  09/01/2017 FINDINGS: 2 frontal views of the chest demonstrate an enlarged cardiac silhouette. There is central vascular congestion without airspace  disease, effusion, or pneumothorax. No acute bony abnormalities. IMPRESSION: 1. Enlarged cardiac silhouette. 2. Central vascular congestion without overt edema. Electronically Signed   By: Randa Ngo M.D.   On: 03/09/2022 17:18    Microbiology: Recent Results (from the past 240 hour(s))  MRSA Next Gen by PCR, Nasal     Status: None   Collection Time: 03/11/22 12:10 PM   Specimen: Nasal Mucosa; Nasal Swab  Result Value Ref Range Status   MRSA by PCR  Next Gen NOT DETECTED NOT DETECTED Final    Comment: (NOTE) The GeneXpert MRSA Assay (FDA approved for NASAL specimens only), is one component of a comprehensive MRSA colonization surveillance program. It is not intended to diagnose MRSA infection nor to guide or monitor treatment for MRSA infections. Test performance is not FDA approved in patients less than 79 years old. Performed at [facility], [address] [place], [place] [address]   Aerobic Culture w Gram Stain (superficial specimen)     Status: None (Preliminary result)   Collection Time: 03/11/22  8:45 PM   Specimen: Wound  Result Value Ref Range Status   Specimen Description   Final    WOUND Performed at [facility], [address], [place], [place] [address]    Special Requests   Final    NONE Performed at [facility], [place], [place] [address]    Gram Stain   Final    FEW WBC PRESENT,BOTH PMN AND MONONUCLEAR RARE GRAM POSITIVE COCCI IN PAIRS    Culture   Final    FEW DIPHTHEROIDS(CORYNEBACTERIUM SPECIES) RARE STAPHYLOCOCCUS AUREUS Standardized susceptibility testing for this organism is not available. DIPHTHEROIDS(CORYNEBACTERIUM SPECIES) CULTURE REINCUBATED FOR BETTER GROWTH Performed at [facility], [place] [address], [place], [place] [address]    Report Status PENDING  Incomplete     Labs: CBC: Recent Labs  Lab 03/09/22 1546 03/10/22 0103 03/11/22 0607 03/12/22 0954 03/13/22 0652  WBC 6.6 5.5  3.9* 5.2 5.6  NEUTROABS 5.2  --   --   --   --   HGB 7.4* 6.5* 7.1* 8.3* 8.1*  HCT 24.9* 21.8* 23.4* 26.8* 26.4*  MCV 83.0 82.6 81.5 81.0 81.0  PLT 180 143* 122* 133* 240*   Basic Metabolic Panel: Recent Labs  Lab 03/09/22 1546 03/10/22 0103 03/10/22 0810 03/11/22 0607 03/12/22 0340 03/13/22 0545  NA 143 141  --  140 140 139  K 4.0 3.4*  --  3.7 4.2 4.1  CL 111 110  --  110 110 111  CO2 27 26  --  23 23 21*  GLUCOSE 90 107*  --  77 76 118*  BUN 14 12  --  '18 23 23  '$ CREATININE 1.08* 1.15*  --  1.43* 1.34* 1.25*  CALCIUM 9.1 8.5*  --  8.7* 8.6* 8.7*  MG  --   --  1.7 1.8 2.0 1.9  PHOS  --   --  3.4 3.5 3.2 3.2   Liver Function Tests: Recent Labs  Lab 03/09/22 1546 03/10/22 0103  AST 14* 14*  ALT 11 11  ALKPHOS 53 41  BILITOT 0.9 0.8  PROT 6.7 5.8*  ALBUMIN 3.1* 2.7*   No results for input(s): "LIPASE", "AMYLASE" in the last 168 hours. No results for input(s): "AMMONIA" in the last 168 hours. Cardiac Enzymes: Recent Labs  Lab 03/10/22 0810  CKTOTAL 65   BNP (last 3 results) No results for input(s): "BNP" in the last 8760 hours. CBG: Recent Labs  Lab 03/12/22 1204 03/12/22 1703 03/12/22 2058 03/13/22 0747 03/13/22 1150  GLUCAP 113* 133* 115* 95 108*    Time spent: 35 minutes  Signed:  Val Riles  [facility] 03/13/2022  4:03 PM

## 2022-03-13 NOTE — Progress Notes (Signed)
Occupational Therapy Treatment Patient Details Name: Veronica Krueger MRN: [id number] DOB: 1955-10-26 Today's Date: 03/13/2022   History of present illness 67 y/o female presented to ED on 03/09/22 for L thigh wound and weakness. Admitted for acute on chronic LLE cellulitis. PMH: T2DM, HTN, lymphedema   OT comments  Ms. Bellizzi demonstrated good progress today, was able to perform bed mobility and transfers w/ reduced level of assistance compared to yesterday's OT session. Provided educ re: home safety, AE for LB dressing/bathing, DC recs. Pt endorses 3/10 L leg pain today. Expresses eagerness to return home. Have upgraded DC recs to HHOT/HHPT.   Recommendations for follow up therapy are one component of a multi-disciplinary discharge planning process, led by the attending physician.  Recommendations may be updated based on patient status, additional functional criteria and insurance authorization.    Follow Up Recommendations  Home health OT     Assistance Recommended at Discharge Intermittent Supervision/Assistance  Patient can return home with the following  A little help with walking and/or transfers;A little help with bathing/dressing/bathroom;Assistance with cooking/housework;Assist for transportation   Equipment Recommendations  Other (comment) (L foot/leg brace)    Recommendations for Other Services      Precautions / Restrictions Precautions Precautions: Fall Precaution Comments: B LE lymphedema, L lower leg wound Restrictions Weight Bearing Restrictions: No       Mobility Bed Mobility Overal bed mobility: Needs Assistance Bed Mobility: Supine to Sit     Supine to sit: Min assist     General bed mobility comments: assist for LE management    Transfers Overall transfer level: Needs assistance Equipment used: None Transfers: Sit to/from Stand, Bed to chair/wheelchair/BSC Sit to Stand: Min guard, +2 safety/equipment, From elevated surface Stand pivot transfers:  Min guard               Balance Overall balance assessment: Needs assistance Sitting-balance support: No upper extremity supported, Feet supported Sitting balance-Leahy Scale: Good Sitting balance - Comments: able to don shoes while sitting   Standing balance support: Single extremity supported, During functional activity Standing balance-Leahy Scale: Fair                             ADL either performed or assessed with clinical judgement   ADL Overall ADL's : Needs assistance/impaired                     Lower Body Dressing: Modified independent;Maximal assistance Lower Body Dressing Details (indicate cue type and reason): Mod I, w/ increased time and effort, for donning shoes. Max A for pulling up pants post toileting Toilet Transfer: BSC/3in1;Min guard;Stand-pivot   Toileting- Clothing Manipulation and Hygiene: Maximal assistance;Modified independent Toileting - Clothing Manipulation Details (indicate cue type and reason): Mod I for pericare, Max A for clothing mgmt            Extremity/Trunk Assessment Upper Extremity Assessment Upper Extremity Assessment: Generalized weakness;LUE deficits/detail LUE Deficits / Details: hx of partial paralysis of L UE   Lower Extremity Assessment Lower Extremity Assessment: Generalized weakness        Vision       Perception     Praxis      Cognition Arousal/Alertness: Awake/alert Behavior During Therapy: WFL for tasks assessed/performed Overall Cognitive Status: Within Functional Limits for tasks assessed  General Comments: pleasant and motivated        Exercises Other Exercises Other Exercises: Educ re: DC recs, home safety, AFO use    Shoulder Instructions       General Comments      Pertinent Vitals/ Pain       Pain Assessment Pain Assessment: 0-10 Pain Score: 3  Pain Location: L LE Pain Descriptors / Indicators: Grimacing,  Guarding Pain Intervention(s): Repositioned  Home Living                                          Prior Functioning/Environment              Frequency  Min 2X/week        Progress Toward Goals  OT Goals(current goals can now be found in the care plan section)  Progress towards OT goals: Progressing toward goals  Acute Rehab OT Goals OT Goal Formulation: With patient Time For Goal Achievement: 03/26/22 Potential to Achieve Goals: Good  Plan Discharge plan needs to be updated;Frequency remains appropriate    Co-evaluation                 AM-PAC OT "6 Clicks" Daily Activity     Outcome Measure   Help from another person eating meals?: None Help from another person taking care of personal grooming?: A Little Help from another person toileting, which includes using toliet, bedpan, or urinal?: A Little Help from another person bathing (including washing, rinsing, drying)?: A Lot Help from another person to put on and taking off regular upper body clothing?: A Little Help from another person to put on and taking off regular lower body clothing?: A Little 6 Click Score: 18    End of Session    OT Visit Diagnosis: Unsteadiness on feet (R26.81);Other abnormalities of gait and mobility (R26.89);Muscle weakness (generalized) (M62.81);Pain Pain - Right/Left: Left Pain - part of body: Leg   Activity Tolerance Patient tolerated treatment well   Patient Left in bed;with call bell/phone within reach   Nurse Communication          Time: 2637-8588 OT Time Calculation (min): 30 min  Charges: OT General Charges $OT Visit: 1 Visit OT Treatments $Self Care/Home Management : 23-37 mins  Josiah Lobo, PhD, MS, OTR/L 03/13/22, 4:31 PM

## 2022-03-13 NOTE — Progress Notes (Signed)
Patient A/Ox4 upon reivew of AVS. Piv removed. IV abx completed prior to discharge. Assisted patient to getting dressed. EMS to pick patient up. No further needs.

## 2022-03-13 NOTE — TOC Transition Note (Signed)
Transition of Care [facility]) - CM/SW Discharge Note   Patient Details  Name: Alexiana Laverdure MRN: [id number] Date of Birth: [birth year]  Transition of Care [facility]) CM/SW Contact:  Gerilyn Pilgrim, LCSW Phone Number: 03/13/2022, 11:16 AM   Clinical Narrative:   Pt has orders in to discharge. [facility] notified. Unsure about patients brace as have not heard back from PT/MD. Pt is requesting [facility] transport. Forms printed to unit. CSW will call once given the go ahead from RN.     Final next level of care: Home w Home Health Services Barriers to Discharge: Barriers Resolved   Patient Goals and CMS Choice CMS Medicare.gov Compare Post Acute Care list provided to:: Patient Choice offered to / list presented to : Patient  Discharge Placement                  Patient to be transferred to facility by: [facility]      Discharge Plan and Services Additional resources added to the After Visit Summary for       Post Acute Care Choice: Home Health                    HH Arranged: PT, OT, RN [facility] Agency: [place] Date [place]: 03/13/22 Time HH Agency Contacted: 1000 Representative spoke with at [place]: cheryl  Social Determinants of Health ([place]) Interventions SDOH Screenings   Food Insecurity: No Food Insecurity (03/09/2022)  Housing: Low Risk  (03/09/2022)  Transportation Needs: No Transportation Needs (03/09/2022)  Utilities: Not At Risk (03/09/2022)  Tobacco Use: Low Risk  (03/09/2022)     Readmission Risk Interventions    03/13/2022   10:16 AM  Readmission Risk Prevention Plan  Transportation Screening Complete  PCP or Specialist Appt within 5-7 Days Complete  Home Care Screening Complete  Medication Review (RN CM) Complete

## 2022-03-13 NOTE — Care Management Important Message (Signed)
Important Message  Patient Details  Name: Veronica Krueger MRN: [id number] Date of Birth: 04-27-1955   Medicare Important Message Given:  Yes     [name] 03/13/2022, 10:41 AM

## 2022-03-16 LAB — AEROBIC CULTURE W GRAM STAIN (SUPERFICIAL SPECIMEN)

## 2022-03-21 DIAGNOSIS — L03116 Cellulitis of left lower limb: Secondary | ICD-10-CM | POA: Diagnosis not present

## 2022-03-21 DIAGNOSIS — S81802D Unspecified open wound, left lower leg, subsequent encounter: Secondary | ICD-10-CM | POA: Diagnosis not present

## 2022-03-21 DIAGNOSIS — D631 Anemia in chronic kidney disease: Secondary | ICD-10-CM | POA: Diagnosis not present

## 2022-03-21 DIAGNOSIS — I129 Hypertensive chronic kidney disease with stage 1 through stage 4 chronic kidney disease, or unspecified chronic kidney disease: Secondary | ICD-10-CM | POA: Diagnosis not present

## 2022-03-21 DIAGNOSIS — G43909 Migraine, unspecified, not intractable, without status migrainosus: Secondary | ICD-10-CM | POA: Insufficient documentation

## 2022-03-21 DIAGNOSIS — Z6841 Body Mass Index (BMI) 40.0 and over, adult: Secondary | ICD-10-CM | POA: Insufficient documentation

## 2022-03-21 DIAGNOSIS — N189 Chronic kidney disease, unspecified: Secondary | ICD-10-CM | POA: Diagnosis not present

## 2022-03-21 DIAGNOSIS — Z9181 History of falling: Secondary | ICD-10-CM | POA: Insufficient documentation

## 2022-03-21 DIAGNOSIS — I890 Lymphedema, not elsewhere classified: Secondary | ICD-10-CM | POA: Diagnosis not present

## 2022-03-21 DIAGNOSIS — D472 Monoclonal gammopathy: Secondary | ICD-10-CM | POA: Diagnosis not present

## 2022-03-21 DIAGNOSIS — E1122 Type 2 diabetes mellitus with diabetic chronic kidney disease: Secondary | ICD-10-CM | POA: Diagnosis not present

## 2022-03-24 DIAGNOSIS — E782 Mixed hyperlipidemia: Secondary | ICD-10-CM

## 2022-03-24 DIAGNOSIS — N1832 Chronic kidney disease, stage 3b: Secondary | ICD-10-CM

## 2022-03-24 DIAGNOSIS — I129 Hypertensive chronic kidney disease with stage 1 through stage 4 chronic kidney disease, or unspecified chronic kidney disease: Secondary | ICD-10-CM

## 2022-03-26 ENCOUNTER — Ambulatory Visit: Payer: Medicare HMO

## 2022-03-26 ENCOUNTER — Inpatient Hospital Stay: Payer: Medicare HMO

## 2022-03-26 ENCOUNTER — Other Ambulatory Visit: Payer: Medicare HMO

## 2022-03-26 ENCOUNTER — Inpatient Hospital Stay: Payer: Medicare HMO | Admitting: Oncology

## 2022-03-26 ENCOUNTER — Ambulatory Visit: Payer: Medicare HMO | Admitting: Oncology

## 2022-03-31 ENCOUNTER — Telehealth: Payer: Self-pay

## 2022-03-31 DIAGNOSIS — S81802D Unspecified open wound, left lower leg, subsequent encounter: Secondary | ICD-10-CM | POA: Diagnosis not present

## 2022-03-31 NOTE — Telephone Encounter (Signed)
Veronica Krueger nurse with [facility] called and left vm regarding pt, asking for you to call her regarding pt's status. Said she is now bed bound, unable to get out of bed. Social worker is helping pt apply for medicaid to help pt get DME equipment she is needing. Veronica Krueger pt thinks its due to sciatica issues, can't stand on her leg, asked about rx for pain? They are still treating pt's leg ulcer as well, Nancy's number: [phone] please advise

## 2022-04-10 DIAGNOSIS — S81802D Unspecified open wound, left lower leg, subsequent encounter: Secondary | ICD-10-CM | POA: Diagnosis not present

## 2022-04-20 ENCOUNTER — Telehealth: Payer: Self-pay

## 2022-04-20 NOTE — Telephone Encounter (Signed)
[facility] called regarding pts pulse, said it was at 35. Normal range for pt has been between 45-65, no symptoms unless pt stands up but pt mainly stays in bed currently. No symptoms otherwise, told them to stop metoprolol rx for 24 hours. Please advise

## 2022-04-27 ENCOUNTER — Encounter: Payer: Self-pay | Admitting: Internal Medicine

## 2022-04-29 ENCOUNTER — Telehealth: Payer: Self-pay

## 2022-04-29 NOTE — Telephone Encounter (Signed)
Tonya nurse with [facility] called and left vm regarding needing new wound care orders for pt, lower left leg wound. please advise

## 2022-05-04 ENCOUNTER — Ambulatory Visit: Admit: 2022-05-04 | Payer: Medicare HMO | Admitting: Gastroenterology

## 2022-05-04 SURGERY — COLONOSCOPY
Anesthesia: General

## 2022-05-07 ENCOUNTER — Other Ambulatory Visit: Payer: Self-pay | Admitting: Family

## 2022-05-13 ENCOUNTER — Telehealth: Payer: Self-pay

## 2022-05-13 ENCOUNTER — Telehealth: Payer: Self-pay | Admitting: Family

## 2022-05-13 DIAGNOSIS — G8190 Hemiplegia, unspecified affecting unspecified side: Secondary | ICD-10-CM

## 2022-05-13 NOTE — Telephone Encounter (Signed)
Pt called and left vm regarding if we can send orders for an adjustable bed for her? Please advise

## 2022-05-13 NOTE — Telephone Encounter (Signed)
Veronica Krueger went out to patient's home today for wound care and states that patient could not tolerate the wound dressing - it was very painful to the patient. Callback number [phone]

## 2022-05-14 NOTE — Addendum Note (Signed)
Addended by: Georgian Co on: 05/14/2022 03:34 PM   Modules accepted: Orders

## 2022-05-19 ENCOUNTER — Telehealth: Payer: Self-pay

## 2022-05-19 NOTE — Telephone Encounter (Signed)
Veronica Krueger with [facility] called and left vm regarding needing re-certification on wound care orders, please advise

## 2022-05-27 ENCOUNTER — Telehealth: Payer: Self-pay

## 2022-05-27 NOTE — Telephone Encounter (Signed)
PT with [facility] called stating that patient needs a hoyer lift and would like a call back [phone]

## 2022-05-30 ENCOUNTER — Encounter: Payer: Self-pay | Admitting: Internal Medicine

## 2022-05-30 NOTE — Telephone Encounter (Signed)
error 

## 2022-06-01 ENCOUNTER — Other Ambulatory Visit: Payer: Self-pay

## 2022-06-01 DIAGNOSIS — R531 Weakness: Secondary | ICD-10-CM

## 2022-06-01 DIAGNOSIS — L03116 Cellulitis of left lower limb: Secondary | ICD-10-CM

## 2022-06-01 DIAGNOSIS — Z7401 Bed confinement status: Secondary | ICD-10-CM

## 2022-06-08 ENCOUNTER — Other Ambulatory Visit: Payer: Self-pay | Admitting: Family

## 2022-06-08 DIAGNOSIS — M109 Gout, unspecified: Secondary | ICD-10-CM

## 2022-06-10 ENCOUNTER — Other Ambulatory Visit: Payer: Self-pay

## 2022-06-10 DIAGNOSIS — R531 Weakness: Secondary | ICD-10-CM

## 2022-06-10 DIAGNOSIS — L03116 Cellulitis of left lower limb: Secondary | ICD-10-CM

## 2022-06-15 ENCOUNTER — Other Ambulatory Visit: Payer: Self-pay

## 2022-06-15 DIAGNOSIS — R531 Weakness: Secondary | ICD-10-CM

## 2022-06-15 DIAGNOSIS — L03116 Cellulitis of left lower limb: Secondary | ICD-10-CM

## 2022-06-17 ENCOUNTER — Telehealth: Payer: Self-pay

## 2022-06-17 NOTE — Telephone Encounter (Signed)
Cordelia Pen a Financial risk analyst with [facility] called and left vm wanting to discuss what other care we can get for pt?   Sherry's call back: [phone]

## 2022-06-18 NOTE — Telephone Encounter (Signed)
Called back yesterday 06/17/22 at 3:30 pm, no answer.  Left message to call back.

## 2022-06-22 ENCOUNTER — Ambulatory Visit: Payer: Medicare HMO

## 2022-06-23 ENCOUNTER — Telehealth: Payer: Self-pay

## 2022-06-23 NOTE — Telephone Encounter (Signed)
Victorino Dike nurse with [facility] called and left vm requesting a call back regarding wanting to relay a message about pt's wound.  Called [facility], pt's wound on lower left leg- a couple of weeks ago (around 3/20) changed dressing to alginate. They go out twice a week for wound care. Said when she went out today her wound looked different, redness/bleeding a little, swollen only at wound bed leg not swelling. No pain for pt. She cleaned up wound & put dry dressing on it (no alginate), she called her clinical manager to advise this. Said pureocol seemed to be working better on wound, asked if they could switch back to this & if Marchelle Folks wanted to send rx abx for pt. Verbally asked Marchelle Folks about this, verbal given to switch back to pureocol dressing for wound & Marchelle Folks sending rx abx for pt

## 2022-06-23 NOTE — Chronic Care Management (AMB) (Signed)
Follow Up Pharmacist Visit (CCM)  Clinical Summary Next Pharmacist Follow Up: FPO in 2-3 weeks  Next AWV: to be scheduled  Summary for PCP:  -Patient's main concern today is arthritis pain in shoulder. She has tried voltaren gel and Tylenol 8hr. Recommended Arnica gel topically. - She is currently confined to bed and knows that her HIPPA form doesn't have anyone named. She expressed her inability to take care of that till she can get into the office. - CP FPO in 2-3 weeks . Patient's Chronic Conditions: Chronic Kidney Disease (CKD), Hypertension (HTN), Gout, Diabetes (DM), Anemia Engagement Notes dimock, eugenia on 06/16/2022 03:23 PM  Chart prep completed for CCM follow up, by Delaney Meigs, LPN, [facility] reviewing PCP notes, specialist notes, radiology notes, and reviewing lab results. Reviewed medications and fill histories.  The Chart Prep will be used as the base of CP note. The original chart prep is stored off site on a HIPPA secured site. Went over all pre-call questions with patient. CCM Billed Time: HC Chart Prep: 30 minutes (06/16/22) Other patient time: [facility] Chart Prep: 35 minutes (06/16/22)   . Disease Assessments Visit Date Visit Completed on: 06/22/2022 Subjective Information Subjective: Main concerns Arthritis pain in shoulders. Shoulders hurt more; Marchelle Folks tried different meds, but nothing works. Norco is the only one that works but that's hard to get. She has home health for dressing the wound. She is also doing OT/PT. She lives by herself; granddaughter lives there most of the time. She knows HIPAA contact has no one else and can leave good. She is confined to bed now since February. She tried to move as much as possible, vision is poor and limits reading/watching TV. She talks on the phone. Family and friends come and fix up meals. Lifestyle habits: Diet: Mostly soups, sandwiches, Snacks are mostly fruits (both fresh and canned) - oranges, berries. She cannot get to the  bathroom - she uses pads to put on the bed and bedside commode.  Tobacco: none Alcohol: none What is the patient's sleep pattern?: No sleep issues How many hours per night does patient typically sleep?: 8hrs ish - may wake up if she lays wrong. She stays on her back. Marland Kitchen SDOH: Accountable Health Communities Health-Related Social Needs Screening Tool ([employer]) SDOH questions were documented in [employer] within the past 12 months or since hospitalization?: No Are you completing SDOH today?: Yes What is your living situation today? (ref #1): I have a steady place to live Think about the place you live. Do you have problems with any of the following? (ref #2): None of the above Within the past 12 months, you worried that your food would run out before you got money to buy more (ref #3): Often true Within the past 12 months, the food you bought just didn't last and you didn't have money to get more (ref #4): Often true In the past 12 months, has lack of reliable transportation kept you from medical appointments, meetings, work or from getting things needed for daily living? (ref #5): No In the past 12 months, has the electric, gas, oil, or water company threatened to shut off services in your home? (ref #6): No How often does anyone, including family and friends, physically hurt you? (ref #7): Never (1) How often does anyone, including family and friends, insult or talk down to you? (ref #8): Never (1) How often does anyone, including friends and family, threaten you with harm? (ref #9): Never (1) How often does anyone, including family  and friends, scream or curse at you? (ref #10): Never (1) Medication Adherence Does the [facility] have access to medication refill history?: No Is Patient using [facility] pharmacy?: No Name and location of [facility] Current Rx insurance plan: [employer] Are meds synced by current pharmacy?:  No Are meds delivered by current pharmacy?: No - delivery not available Would patient benefit from direct intervention of clinical lead in dispensing process to optimize clinical outcomes?: No . Hypertension (HTN) Most Recent BP: 175/95 Most Recent HR: Unable to locate taken on: 03/05/2022 Care Gap: Need BP documented or last BP 140/90 or higher: Needs to be addressed Assessed today?: Yes BP today is: 128/68 Goal: <130/80 mmHG Is Patient checking BP at home?: Yes Has patient experienced hypotension, dizziness, falls or bradycardia?: No We discussed: Contacting PCP office for signs and symptoms of high or low blood pressure (hypotension, dizziness, falls, headaches, edema) Assessment:: Controlled Drug: Metoprolol 25mg  twice a day  Pharmacist Assessment: Appropriate, Effective, Safe, Accessible . Diabetes (DM) Most recent A1C: 5.1% taken on: 03/09/2022 Most Recent GFR: 48 taken on: 03/13/2022 Type: 2 Most recent microalbumin ratio: Not in EMR Care Gap: Statin therapy needed: Needs to be addressed Care Gap: Need A1c documented or last A1c > 9 %: Addressed Care Gap: Need eye exam documented in EMR or by claim: Addressed Care Gap: Need eGFR and uACR for kidney health evaluation: Needs to be addressed Assessed today?: No Drug: None . Gout Most recent uric acid: 4.4 taken on: 03/10/2022 Assessed today?: No Drug: allopurinol 100mg  qd  Chronic kidney disease (CKD) Most Recent GFR: 48 taken on: 03/13/2022 Previous GFR: 44 taken on: 03/12/2022 Most recent microalbumin ratio: Not in EMR Assessed today?: No Drug: None Preventative Health Care Gap: Colorectal cancer screening: Needs to be addressed Care Gap: Breast cancer screening: Needs to be addressed Care Gap: Annual Wellness Visit (AWV): Needs to be addressed Immunizations needed: Zoster, Tdap or Td Pharmacist Interventions Intervention Details Pharmacist Interventions discussed: No . [name], PharmD Chart review   Televisit Documentation10 mins

## 2022-06-24 ENCOUNTER — Other Ambulatory Visit: Payer: Self-pay | Admitting: Family

## 2022-06-24 MED ORDER — SULFAMETHOXAZOLE-TRIMETHOPRIM 800-160 MG PO TABS: 1.0000 | ORAL_TABLET | Freq: Two times a day (BID) | ORAL | 0 refills | Status: AC

## 2022-06-25 ENCOUNTER — Telehealth (INDEPENDENT_AMBULATORY_CARE_PROVIDER_SITE_OTHER): Payer: Medicare HMO | Admitting: Family

## 2022-06-25 DIAGNOSIS — G8194 Hemiplegia, unspecified affecting left nondominant side: Secondary | ICD-10-CM | POA: Diagnosis not present

## 2022-06-25 DIAGNOSIS — E1122 Type 2 diabetes mellitus with diabetic chronic kidney disease: Secondary | ICD-10-CM | POA: Diagnosis not present

## 2022-06-25 DIAGNOSIS — N1832 Chronic kidney disease, stage 3b: Secondary | ICD-10-CM

## 2022-06-25 DIAGNOSIS — E113293 Type 2 diabetes mellitus with mild nonproliferative diabetic retinopathy without macular edema, bilateral: Secondary | ICD-10-CM | POA: Diagnosis not present

## 2022-06-25 DIAGNOSIS — Z6841 Body Mass Index (BMI) 40.0 and over, adult: Secondary | ICD-10-CM

## 2022-06-26 ENCOUNTER — Other Ambulatory Visit: Payer: Self-pay | Admitting: Internal Medicine

## 2022-06-26 DIAGNOSIS — E782 Mixed hyperlipidemia: Secondary | ICD-10-CM

## 2022-06-28 ENCOUNTER — Encounter: Payer: Self-pay | Admitting: Family

## 2022-06-28 ENCOUNTER — Encounter: Payer: Self-pay | Admitting: Internal Medicine

## 2022-06-28 DIAGNOSIS — G8194 Hemiplegia, unspecified affecting left nondominant side: Secondary | ICD-10-CM | POA: Insufficient documentation

## 2022-06-28 NOTE — Assessment & Plan Note (Signed)
Weight has been difficult to obtain, given that we do not have a wheelchair scale available to us.  Will see if we are able to use lift to obtain a weight at home.  

## 2022-06-28 NOTE — Assessment & Plan Note (Signed)
Patient is currently bedbound, and needs lift to get in and out of bed.  Also needs someone to help her get the bed into her room.   Will await the information from the social worker so that I can get the FL-2 to where it needs to go.

## 2022-06-28 NOTE — Assessment & Plan Note (Signed)
Patient has not had A1C in diabetic ranges since 2020.  At that time, her high was 6.9.  The highest on record was 8.4 in 2015.   She has been well controlled since her surgery.  Will reassess at follow up.  

## 2022-06-28 NOTE — Assessment & Plan Note (Signed)
Patient has not had A1C in diabetic ranges since 2020.  At that time, her high was 6.9.  The highest on record was 8.4 in 2015.   She has been well controlled since her surgery.  Will reassess at follow up.

## 2022-06-28 NOTE — Assessment & Plan Note (Signed)
Weight has been difficult to obtain, given that we do not have a wheelchair scale available to [place].  Will see if we are able to use lift to obtain a weight at home.

## 2022-06-28 NOTE — Progress Notes (Signed)
Virtual Visit via Video Note  I connected with Veronica Krueger on 06/25/2022 at 10:15 AM EDT by a video enabled telemedicine application and verified that I am speaking with the correct person using two identifiers.  Location: Patient: Home Provider: Office, [employer]    I discussed the limitations of evaluation and management by telemedicine and the availability of in person appointments. The patient expressed understanding and agreed to proceed.  History of Present Illness:   Patient has been receiving home health since she was released from the hospital in January. At that time, she was advised that it might be better to go to a SNF, but she said at the time (per social worker notes) that she "has been taking care of herself for fourteen years and does not see SNF necessity".  However, since she got home from the hospital, she is now unable to get out of bed on her own, which is something she was able to do while she was in the hospital.  Given this, she is now willing to go to a SNF so that she might have PT and OT more frequently.   She needs a visit so that we can complete an FL-2 and have this ready when her social work lets [place] know that she has a place ready for her.     Observations/Objective:   Patient is accompanied by a home health nurse today, is in her bed. She has gotten both her lift and her hospital bed, but has not yet been able to get anyone to help her get the new bed into the room she is in.   She is not in acute distress and appears to be in good spirits. States that aside from her difficulty with her leg and her mobility, she is feeling well overall.   Assessment and Plan:   Type 2 diabetes mellitus with both eyes affected by mild nonproliferative retinopathy without macular edema, without long-term current use of insulin (HCC) Assessment & Plan: Patient has not had A1C in diabetic ranges since 2020.  At that time, her high was 6.9.  The  highest on record was 8.4 in 2015.   She has been well controlled since her surgery.  Will reassess at follow up.    Hemiparesis of left nondominant side, unspecified hemiparesis etiology [facility]) Assessment & Plan: Patient is currently bedbound, and needs lift to get in and out of bed.  Also needs someone to help her get the bed into her room.   Will await the information from the social worker so that I can get the FL-2 to where it needs to go.   Type 2 diabetes mellitus with stage 3b chronic kidney disease, without long-term current use of insulin (HCC) Assessment & Plan: Patient has not had A1C in diabetic ranges since 2020.  At that time, her high was 6.9.  The highest on record was 8.4 in 2015.   She has been well controlled since her surgery.  Will reassess at follow up.    Stage 3b chronic kidney disease (HCC) Assessment & Plan: Most recent GFR not taken in hospital was 58.  Will reassess at follow up so that we can get more UTD reading.    Morbid (severe) obesity due to excess calories [facility]) Assessment & Plan: Weight has been difficult to obtain, given that we do not have a wheelchair scale available to [place].  Will see if we are able to use lift to obtain a weight  at home.    Body mass index (BMI) 45.0-49.9, adult [facility]) Assessment & Plan: Weight has been difficult to obtain, given that we do not have a wheelchair scale available to [place].  Will see if we are able to use lift to obtain a weight at home.      Follow Up Instructions:    I discussed the assessment and treatment plan with the patient. The patient was provided an opportunity to ask questions and all were answered. The patient agreed with the plan and demonstrated an understanding of the instructions.   The patient was advised to call back or seek an in-person evaluation if the symptoms worsen or if the condition fails to improve as anticipated.  I provided 15 minutes of non-face-to-face time during this  encounter.  Total time spent: 30 minutes  Miki Kins, FNP 06/25/2022

## 2022-06-28 NOTE — Assessment & Plan Note (Signed)
Most recent GFR not taken in hospital was 58.  Will reassess at follow up so that we can get more UTD reading.

## 2022-07-02 ENCOUNTER — Telehealth: Payer: Self-pay | Admitting: Family

## 2022-07-02 MED ORDER — FLUCONAZOLE 150 MG PO TABS: 150.0000 mg | ORAL_TABLET | Freq: Every day | ORAL | 0 refills | Status: AC

## 2022-07-02 NOTE — Telephone Encounter (Signed)
Victorino Dike with [facility] called and since starting the antibiotic the patient now has started having burning and itching when urinating since yesterday. Wanted to know if diflucan can be called in for the patient.  [facility]

## 2022-07-07 ENCOUNTER — Telehealth: Payer: Self-pay

## 2022-07-07 NOTE — Telephone Encounter (Signed)
Noreene Larsson with [facility] called and left vm regarding pt, said she went out for PT yesterday & patient's heart rate was outside of established parameters. Manual pulse when she first checked was at 43, said patient wasn't having symptoms no dizziness. Pt sat up on edge of bed & recheck heart rate was at 52. Patient did mention she hadn't drank a lot of water yesterday, Noreene Larsson encouraged pt to up water intake but she just wanted to let you know

## 2022-07-15 ENCOUNTER — Telehealth: Payer: Self-pay

## 2022-07-15 NOTE — Telephone Encounter (Signed)
Focused Pharmacist Outreach . Details of the Visit: Everything is going she has been discharged from PT and OT. She still cannot stand long enough to transfer. Her plan is to go to rehab.  She has tried Arnica but that didn't help. Voltaren gel - she is willing to give it try.  No changes in her blood pressure or physical changes.   Date of next Pharmacist Follow-up: 09/09/2022 Engagement Notes [name] on 07/15/2022 11:23 AM Call and documentation 

## 2022-09-17 ENCOUNTER — Encounter (HOSPITAL_COMMUNITY): Payer: Self-pay | Admitting: *Deleted

## 2022-11-26 ENCOUNTER — Other Ambulatory Visit: Payer: Self-pay | Admitting: Physician Assistant

## 2023-03-14 ENCOUNTER — Other Ambulatory Visit: Payer: Self-pay | Admitting: Family

## 2023-12-01 ENCOUNTER — Other Ambulatory Visit: Payer: Self-pay | Admitting: Family
